# Patient Record
Sex: Female | Born: 1945 | Race: White | Hispanic: No | Marital: Married | Smoking: Former smoker
Health system: Southern US, Community
[De-identification: ages and names within clinical notes are randomized; demographics above are authoritative.]

## PROBLEM LIST (undated history)

## (undated) DIAGNOSIS — N200 Calculus of kidney: Secondary | ICD-10-CM

## (undated) DIAGNOSIS — I10 Essential (primary) hypertension: Secondary | ICD-10-CM

## (undated) DIAGNOSIS — E119 Type 2 diabetes mellitus without complications: Secondary | ICD-10-CM

## (undated) DIAGNOSIS — E7800 Pure hypercholesterolemia, unspecified: Secondary | ICD-10-CM

## (undated) DIAGNOSIS — K5790 Diverticulosis of intestine, part unspecified, without perforation or abscess without bleeding: Secondary | ICD-10-CM

## (undated) HISTORY — PX: COLON SURGERY: SHX602

---

## 1999-07-27 ENCOUNTER — Encounter: Admission: RE | Admit: 1999-07-27 | Discharge: 1999-07-27 | Payer: Self-pay | Admitting: Family Medicine

## 1999-07-27 ENCOUNTER — Encounter: Payer: Self-pay | Admitting: Family Medicine

## 1999-08-04 ENCOUNTER — Other Ambulatory Visit: Admission: RE | Admit: 1999-08-04 | Discharge: 1999-08-04 | Payer: Self-pay | Admitting: Family Medicine

## 1999-08-07 ENCOUNTER — Encounter: Admission: RE | Admit: 1999-08-07 | Discharge: 1999-08-07 | Payer: Self-pay | Admitting: Family Medicine

## 1999-08-07 ENCOUNTER — Encounter: Payer: Self-pay | Admitting: Family Medicine

## 2000-02-08 ENCOUNTER — Encounter: Payer: Self-pay | Admitting: Family Medicine

## 2000-02-08 ENCOUNTER — Encounter: Admission: RE | Admit: 2000-02-08 | Discharge: 2000-02-08 | Payer: Self-pay | Admitting: Family Medicine

## 2000-11-01 ENCOUNTER — Encounter: Admission: RE | Admit: 2000-11-01 | Discharge: 2000-11-01 | Payer: Self-pay | Admitting: Family Medicine

## 2000-11-01 ENCOUNTER — Encounter: Payer: Self-pay | Admitting: Family Medicine

## 2001-01-04 ENCOUNTER — Other Ambulatory Visit: Admission: RE | Admit: 2001-01-04 | Discharge: 2001-01-04 | Payer: Self-pay | Admitting: Emergency Medicine

## 2001-01-16 ENCOUNTER — Encounter: Admission: RE | Admit: 2001-01-16 | Discharge: 2001-01-16 | Payer: Self-pay | Admitting: Family Medicine

## 2001-01-16 ENCOUNTER — Encounter: Payer: Self-pay | Admitting: Family Medicine

## 2002-06-06 ENCOUNTER — Other Ambulatory Visit: Admission: RE | Admit: 2002-06-06 | Discharge: 2002-06-06 | Payer: Self-pay | Admitting: Internal Medicine

## 2003-01-16 ENCOUNTER — Ambulatory Visit (HOSPITAL_COMMUNITY): Admission: RE | Admit: 2003-01-16 | Discharge: 2003-01-16 | Payer: Self-pay | Admitting: Internal Medicine

## 2003-04-02 ENCOUNTER — Encounter: Admission: RE | Admit: 2003-04-02 | Discharge: 2003-07-01 | Payer: Self-pay | Admitting: Internal Medicine

## 2003-09-25 ENCOUNTER — Encounter: Admission: RE | Admit: 2003-09-25 | Discharge: 2003-09-25 | Payer: Self-pay | Admitting: Internal Medicine

## 2003-12-18 ENCOUNTER — Ambulatory Visit (HOSPITAL_COMMUNITY): Admission: RE | Admit: 2003-12-18 | Discharge: 2003-12-18 | Payer: Self-pay | Admitting: Internal Medicine

## 2004-07-01 ENCOUNTER — Ambulatory Visit: Payer: Self-pay | Admitting: Internal Medicine

## 2004-07-08 ENCOUNTER — Ambulatory Visit: Payer: Self-pay | Admitting: Internal Medicine

## 2004-07-19 ENCOUNTER — Emergency Department (HOSPITAL_COMMUNITY): Admission: EM | Admit: 2004-07-19 | Discharge: 2004-07-19 | Payer: Self-pay

## 2004-07-20 ENCOUNTER — Inpatient Hospital Stay (HOSPITAL_COMMUNITY): Admission: EM | Admit: 2004-07-20 | Discharge: 2004-07-22 | Payer: Self-pay | Admitting: Urology

## 2004-08-13 ENCOUNTER — Ambulatory Visit (HOSPITAL_COMMUNITY): Admission: RE | Admit: 2004-08-13 | Discharge: 2004-08-13 | Payer: Self-pay | Admitting: Urology

## 2005-08-12 ENCOUNTER — Ambulatory Visit: Payer: Self-pay | Admitting: Internal Medicine

## 2005-08-19 ENCOUNTER — Ambulatory Visit: Payer: Self-pay | Admitting: Internal Medicine

## 2005-08-19 ENCOUNTER — Other Ambulatory Visit: Admission: RE | Admit: 2005-08-19 | Discharge: 2005-08-19 | Payer: Self-pay | Admitting: Internal Medicine

## 2005-08-19 ENCOUNTER — Encounter: Payer: Self-pay | Admitting: Internal Medicine

## 2006-01-31 ENCOUNTER — Ambulatory Visit: Payer: Self-pay | Admitting: Internal Medicine

## 2006-02-14 ENCOUNTER — Ambulatory Visit: Payer: Self-pay | Admitting: Internal Medicine

## 2006-08-31 ENCOUNTER — Ambulatory Visit: Payer: Self-pay | Admitting: Internal Medicine

## 2006-08-31 LAB — CONVERTED CEMR LAB
ALT: 29 units/L (ref 0–40)
AST: 27 units/L (ref 0–37)
Albumin: 4.1 g/dL (ref 3.5–5.2)
Alkaline Phosphatase: 59 units/L (ref 39–117)
BUN: 13 mg/dL (ref 6–23)
Basophils Absolute: 0.1 10*3/uL (ref 0.0–0.1)
Basophils Relative: 1 % (ref 0.0–1.0)
Bilirubin Urine: NEGATIVE
CO2: 29 meq/L (ref 19–32)
Calcium: 9.7 mg/dL (ref 8.4–10.5)
Chloride: 106 meq/L (ref 96–112)
Chol/HDL Ratio, serum: 3.1
Cholesterol: 157 mg/dL (ref 0–200)
Creatinine, Ser: 0.8 mg/dL (ref 0.4–1.2)
Eosinophil percent: 2.6 % (ref 0.0–5.0)
GFR calc non Af Amer: 77 mL/min
Glomerular Filtration Rate, Af Am: 93 mL/min/{1.73_m2}
Glucose, Bld: 120 mg/dL — ABNORMAL HIGH (ref 70–99)
HCT: 40.3 % (ref 36.0–46.0)
HDL: 50.2 mg/dL (ref >39.0)
Hemoglobin, Urine: NEGATIVE
Hemoglobin: 12.8 g/dL (ref 12.0–15.0)
Ketones, ur: NEGATIVE mg/dL
LDL Cholesterol: 75 mg/dL (ref 0–99)
Leukocytes, UA: NEGATIVE
Lymphocytes Relative: 20.3 % (ref 12.0–46.0)
MCHC: 31.9 g/dL (ref 30.0–36.0)
MCV: 92.9 fL (ref 78.0–100.0)
Monocytes Absolute: 0.7 10*3/uL (ref 0.2–0.7)
Monocytes Relative: 9.4 % (ref 3.0–11.0)
Neutro Abs: 5.1 10*3/uL (ref 1.4–7.7)
Neutrophils Relative %: 66.7 % (ref 43.0–77.0)
Nitrite: NEGATIVE
Platelets: 337 10*3/uL (ref 150–400)
Potassium: 3.9 meq/L (ref 3.5–5.1)
RBC: 4.33 M/uL (ref 3.87–5.11)
RDW: 12.6 % (ref 11.5–14.6)
Sodium: 143 meq/L (ref 135–145)
Specific Gravity, Urine: 1.025 (ref 1.000–1.03)
TSH: 2.03 microintl units/mL (ref 0.35–5.50)
Total Bilirubin: 0.7 mg/dL (ref 0.3–1.2)
Total Protein, Urine: NEGATIVE mg/dL
Total Protein: 7.4 g/dL (ref 6.0–8.3)
Triglyceride fasting, serum: 158 mg/dL — ABNORMAL HIGH (ref 0–149)
Urine Glucose: NEGATIVE mg/dL
Urobilinogen, UA: 0.2 (ref 0.0–1.0)
VLDL: 32 mg/dL (ref 0–40)
WBC: 7.7 10*3/uL (ref 4.5–10.5)
pH: 6 (ref 5.0–8.0)

## 2006-09-02 ENCOUNTER — Ambulatory Visit: Payer: Self-pay | Admitting: Internal Medicine

## 2007-02-21 ENCOUNTER — Ambulatory Visit: Payer: Self-pay | Admitting: Internal Medicine

## 2007-02-21 LAB — CONVERTED CEMR LAB
BUN: 19 mg/dL (ref 6–23)
CO2: 29 meq/L (ref 19–32)
Calcium: 10.3 mg/dL (ref 8.4–10.5)
Chloride: 101 meq/L (ref 96–112)
Cholesterol: 175 mg/dL (ref 0–200)
Creatinine, Ser: 0.8 mg/dL (ref 0.4–1.2)
GFR calc Af Amer: 93 mL/min
GFR calc non Af Amer: 77 mL/min
Glucose, Bld: 133 mg/dL — ABNORMAL HIGH (ref 70–99)
HDL: 53 mg/dL (ref >39.0)
Hgb A1c MFr Bld: 6.5 % — ABNORMAL HIGH (ref 4.6–6.0)
LDL Cholesterol: 88 mg/dL (ref 0–99)
Potassium: 4.5 meq/L (ref 3.5–5.1)
Sodium: 143 meq/L (ref 135–145)
Total CHOL/HDL Ratio: 3.3
Triglycerides: 171 mg/dL — ABNORMAL HIGH (ref 0–149)
Uric Acid, Serum: 8 mg/dL — ABNORMAL HIGH (ref 2.4–7.0)
VLDL: 34 mg/dL (ref 0–40)

## 2007-02-24 ENCOUNTER — Ambulatory Visit: Payer: Self-pay | Admitting: Internal Medicine

## 2007-11-10 ENCOUNTER — Encounter: Payer: Self-pay | Admitting: Internal Medicine

## 2008-04-16 ENCOUNTER — Telehealth: Payer: Self-pay | Admitting: Internal Medicine

## 2008-04-29 ENCOUNTER — Telehealth: Payer: Self-pay | Admitting: Internal Medicine

## 2008-06-10 ENCOUNTER — Ambulatory Visit: Payer: Self-pay | Admitting: Internal Medicine

## 2008-06-11 LAB — CONVERTED CEMR LAB
ALT: 38 units/L — ABNORMAL HIGH (ref 0–35)
AST: 37 units/L (ref 0–37)
Albumin: 4 g/dL (ref 3.5–5.2)
Alkaline Phosphatase: 52 units/L (ref 39–117)
BUN: 12 mg/dL (ref 6–23)
Basophils Absolute: 0 10*3/uL (ref 0.0–0.1)
Basophils Relative: 0.1 % (ref 0.0–3.0)
Bilirubin Urine: NEGATIVE
Bilirubin, Direct: 0.2 mg/dL (ref 0.0–0.3)
CO2: 29 meq/L (ref 19–32)
Calcium: 9.3 mg/dL (ref 8.4–10.5)
Chloride: 102 meq/L (ref 96–112)
Cholesterol: 166 mg/dL (ref 0–200)
Creatinine, Ser: 0.8 mg/dL (ref 0.4–1.2)
Direct LDL: 77.1 mg/dL
Eosinophils Absolute: 0.2 10*3/uL (ref 0.0–0.7)
Eosinophils Relative: 3.3 % (ref 0.0–5.0)
GFR calc Af Amer: 93 mL/min
GFR calc non Af Amer: 77 mL/min
Glucose, Bld: 126 mg/dL — ABNORMAL HIGH (ref 70–99)
HCT: 39.1 % (ref 36.0–46.0)
HDL: 53.6 mg/dL (ref >39.0)
Hemoglobin, Urine: NEGATIVE
Hemoglobin: 13.4 g/dL (ref 12.0–15.0)
Hgb A1c MFr Bld: 6.7 % — ABNORMAL HIGH (ref 4.6–6.0)
Ketones, ur: NEGATIVE mg/dL
Leukocytes, UA: NEGATIVE
Lymphocytes Relative: 17.7 % (ref 12.0–46.0)
MCHC: 34.4 g/dL (ref 30.0–36.0)
MCV: 92.8 fL (ref 78.0–100.0)
Monocytes Absolute: 0.6 10*3/uL (ref 0.1–1.0)
Monocytes Relative: 10.6 % (ref 3.0–12.0)
Neutro Abs: 4.2 10*3/uL (ref 1.4–7.7)
Neutrophils Relative %: 68.3 % (ref 43.0–77.0)
Nitrite: NEGATIVE
Platelets: 274 10*3/uL (ref 150–400)
Potassium: 4.2 meq/L (ref 3.5–5.1)
RBC: 4.21 M/uL (ref 3.87–5.11)
RDW: 12.8 % (ref 11.5–14.6)
Sodium: 140 meq/L (ref 135–145)
Specific Gravity, Urine: <=1.005 (ref 1.000–1.03)
TSH: 1.34 microintl units/mL (ref 0.35–5.50)
Total Bilirubin: 1 mg/dL (ref 0.3–1.2)
Total CHOL/HDL Ratio: 3.1
Total Protein, Urine: NEGATIVE mg/dL
Total Protein: 6.9 g/dL (ref 6.0–8.3)
Triglycerides: 227 mg/dL (ref 0–149)
Urine Glucose: NEGATIVE mg/dL
Urobilinogen, UA: 0.2 (ref 0.0–1.0)
VLDL: 45 mg/dL — ABNORMAL HIGH (ref 0–40)
WBC: 6.1 10*3/uL (ref 4.5–10.5)
pH: 7 (ref 5.0–8.0)

## 2008-06-13 DIAGNOSIS — M109 Gout, unspecified: Secondary | ICD-10-CM

## 2008-06-13 DIAGNOSIS — E785 Hyperlipidemia, unspecified: Secondary | ICD-10-CM | POA: Insufficient documentation

## 2008-06-13 DIAGNOSIS — E119 Type 2 diabetes mellitus without complications: Secondary | ICD-10-CM

## 2008-06-13 DIAGNOSIS — F329 Major depressive disorder, single episode, unspecified: Secondary | ICD-10-CM

## 2008-06-13 DIAGNOSIS — I10 Essential (primary) hypertension: Secondary | ICD-10-CM | POA: Insufficient documentation

## 2008-06-13 DIAGNOSIS — K219 Gastro-esophageal reflux disease without esophagitis: Secondary | ICD-10-CM

## 2008-06-13 DIAGNOSIS — Z8719 Personal history of other diseases of the digestive system: Secondary | ICD-10-CM

## 2008-06-13 DIAGNOSIS — F411 Generalized anxiety disorder: Secondary | ICD-10-CM

## 2008-06-13 DIAGNOSIS — M810 Age-related osteoporosis without current pathological fracture: Secondary | ICD-10-CM | POA: Insufficient documentation

## 2008-06-13 DIAGNOSIS — Z87442 Personal history of urinary calculi: Secondary | ICD-10-CM

## 2008-06-13 DIAGNOSIS — K279 Peptic ulcer, site unspecified, unspecified as acute or chronic, without hemorrhage or perforation: Secondary | ICD-10-CM | POA: Insufficient documentation

## 2008-06-14 ENCOUNTER — Ambulatory Visit: Payer: Self-pay | Admitting: Internal Medicine

## 2008-06-18 ENCOUNTER — Telehealth: Payer: Self-pay | Admitting: Internal Medicine

## 2008-08-08 ENCOUNTER — Encounter: Payer: Self-pay | Admitting: Internal Medicine

## 2008-11-19 ENCOUNTER — Telehealth (INDEPENDENT_AMBULATORY_CARE_PROVIDER_SITE_OTHER): Payer: Self-pay | Admitting: *Deleted

## 2008-12-05 ENCOUNTER — Ambulatory Visit: Payer: Self-pay | Admitting: Internal Medicine

## 2008-12-05 LAB — CONVERTED CEMR LAB
BUN: 17 mg/dL (ref 6–23)
CO2: 29 meq/L (ref 19–32)
Calcium: 9.6 mg/dL (ref 8.4–10.5)
Chloride: 103 meq/L (ref 96–112)
Cholesterol: 158 mg/dL (ref 0–200)
Creatinine, Ser: 0.7 mg/dL (ref 0.4–1.2)
GFR calc non Af Amer: 89.34 mL/min (ref >60)
Glucose, Bld: 140 mg/dL — ABNORMAL HIGH (ref 70–99)
HDL: 50.4 mg/dL (ref >39.00)
Hgb A1c MFr Bld: 6.7 % — ABNORMAL HIGH (ref 4.6–6.5)
LDL Cholesterol: 69 mg/dL (ref 0–99)
Potassium: 4.3 meq/L (ref 3.5–5.1)
Sodium: 140 meq/L (ref 135–145)
Total CHOL/HDL Ratio: 3
Triglycerides: 192 mg/dL — ABNORMAL HIGH (ref 0.0–149.0)
VLDL: 38.4 mg/dL (ref 0.0–40.0)

## 2008-12-10 ENCOUNTER — Ambulatory Visit: Payer: Self-pay | Admitting: Internal Medicine

## 2008-12-10 DIAGNOSIS — R5381 Other malaise: Secondary | ICD-10-CM

## 2008-12-10 DIAGNOSIS — R5383 Other fatigue: Secondary | ICD-10-CM

## 2008-12-11 ENCOUNTER — Telehealth (INDEPENDENT_AMBULATORY_CARE_PROVIDER_SITE_OTHER): Payer: Self-pay | Admitting: *Deleted

## 2008-12-17 ENCOUNTER — Encounter: Payer: Self-pay | Admitting: Internal Medicine

## 2008-12-17 ENCOUNTER — Other Ambulatory Visit: Admission: RE | Admit: 2008-12-17 | Discharge: 2008-12-17 | Payer: Self-pay | Admitting: Obstetrics & Gynecology

## 2009-01-22 ENCOUNTER — Ambulatory Visit (HOSPITAL_COMMUNITY): Admission: RE | Admit: 2009-01-22 | Discharge: 2009-01-22 | Payer: Self-pay | Admitting: Obstetrics & Gynecology

## 2009-02-21 ENCOUNTER — Encounter: Payer: Self-pay | Admitting: Obstetrics & Gynecology

## 2009-02-21 ENCOUNTER — Ambulatory Visit (HOSPITAL_COMMUNITY): Admission: RE | Admit: 2009-02-21 | Discharge: 2009-02-21 | Payer: Self-pay | Admitting: Obstetrics & Gynecology

## 2009-04-28 ENCOUNTER — Encounter: Payer: Self-pay | Admitting: Internal Medicine

## 2010-12-07 LAB — CBC
HCT: 41 % (ref 36.0–46.0)
Hemoglobin: 14.1 g/dL (ref 12.0–15.0)
MCHC: 34.5 g/dL (ref 30.0–36.0)
MCV: 94.2 fL (ref 78.0–100.0)
Platelets: 258 10*3/uL (ref 150–400)
RBC: 4.35 MIL/uL (ref 3.87–5.11)
RDW: 13.3 % (ref 11.5–15.5)
WBC: 6.4 10*3/uL (ref 4.0–10.5)

## 2010-12-07 LAB — URINALYSIS, ROUTINE W REFLEX MICROSCOPIC
Bilirubin Urine: NEGATIVE
Glucose, UA: NEGATIVE mg/dL
Hgb urine dipstick: NEGATIVE
Ketones, ur: NEGATIVE mg/dL
Nitrite: NEGATIVE
Protein, ur: NEGATIVE mg/dL
Specific Gravity, Urine: 1.015 (ref 1.005–1.030)
Urobilinogen, UA: 0.2 mg/dL (ref 0.0–1.0)
pH: 6.5 (ref 5.0–8.0)

## 2010-12-07 LAB — BASIC METABOLIC PANEL
BUN: 19 mg/dL (ref 6–23)
CO2: 28 mEq/L (ref 19–32)
Calcium: 10.4 mg/dL (ref 8.4–10.5)
Chloride: 101 mEq/L (ref 96–112)
Creatinine, Ser: 0.7 mg/dL (ref 0.4–1.2)
GFR calc Af Amer: >60 mL/min (ref >60)
GFR calc non Af Amer: >60 mL/min (ref >60)
Glucose, Bld: 116 mg/dL — ABNORMAL HIGH (ref 70–99)
Potassium: 3.9 mEq/L (ref 3.5–5.1)
Sodium: 139 mEq/L (ref 135–145)

## 2011-01-12 NOTE — Op Note (Signed)
NAMEVIRDA, BETTERS               ACCOUNT NO.:  [id number]   MEDICAL RECORD NO.:  [id number]          PATIENT TYPE:  AMB   LOCATION:  SDC                           FACILITY:  [facility]   PHYSICIAN:  M. Leda Quail, MD  DATE OF BIRTH:  [birth year]   DATE OF PROCEDURE:  02/21/2009  DATE OF DISCHARGE:                               OPERATIVE REPORT   PREOPERATIVE DIAGNOSES:  25. A 65 year old G2, P2, married white female with postmenopausal      bleeding.  2. Endometrial polyps noted on sonohysterogram.  3. Hypertension.  4. Diabetes mellitus type 2.  5. History of cesarean section x1.   POSTOPERATIVE DIAGNOSES:  43. A 65 year old G2, P2, married white female with postmenopausal      bleeding.  2. Endometrial polyps noted on sonohysterogram.  3. Hypertension.  4. Diabetes mellitus type 2.  5. History of cesarean section x1.   PROCEDURE:  Hysteroscopy, dilation and curettage with polyp resection.   SURGEON:  M. Leda Quail, MD   ASSISTANT:  OR staff.   ANESTHESIA:  LMA, Dr. Malen Gauze saw the case.   FINDINGS:  Anterior endometrial polyp.   SPECIMENS:  Polyp and curettings.   DISPOSITION:  Specimen to Pathology.   ESTIMATED BLOOD LOSS:  Minimal.   FLUIDS:  1000 mL of LR.   URINE OUTPUT:  75 mL.   FLUID DEFICIT:  10 mL of 3% sorbitol.   COMPLICATIONS:  None.   INDICATIONS:  Melissa [name] is a 65 year old G2, P2, married white female  with history of postmenopausal bleeding.  She came to me as a new  patient with a history of bleeding just over the last few weeks.  She  did menopause for about 15 years and was on HRT years ago, but has not  been on this for least 10 years or greater.  Pap smear was normal.  Sonohysterogram was ordered which showed actually 2 polyps, 1 larger and  1 smaller.  Because of this finding, an endometrial biopsy was performed  as well, this was negative except that the biopsy showed superficial  endometrium with benign cervix mucosa and limited  specimen.  I  recommended removal.  The risks and benefits were explained to the  patient.  She was desirous of proceeding and presents for this today.   DESCRIPTION OF PROCEDURE:  The patient was taken to operating room.  She  was placed in supine position.  Anesthesia was administered by the  anesthesia staff without difficulty.  Legs positioned in the low  lithotomy position in [place] stirrups.  SCDs on lower extremities  bilaterally.  Perineum, inner thighs, and vagina were prepped in normal  sterile fashion.  Legs were lifted to the high lithotomy position after  she was draped in a normal sterile fashion.  A red rubber Foley catheter  was used to drain the bladder of all urine.  Bivalve speculum was placed  in the vagina.  The anterior lip of the cervix was grasped with a single  tenaculum.  Paracervical block with 1% lidocaine mixed with 1:1  epinephrine (1:100,000 units) was instilled circumferentially  around the  cervix.  10 mL total was used.  Then, using Pratt dilators, the  endocervix was dilated.  The diagnostic scope was then obtained.  Then,  using a hydrodilation, the scope was passed easily into the endometrial  canal.  This was visualized easily as this was performed.  There was an  anterior polyp and that was all was present.  The endometrial cavity was  nice and atrophic.  Tubal ostia were noted bilaterally and photo  documentation was made.  Then, the scope was removed and a #1 rough  curette was obtained.  The polyp was grasped with a rough curette and  then entire endometrial cavity was curetted until a gritty texture was  noted.  The scope was easily visualized the cavity, the polyp was  completely removed.  At this point, the procedure was ended.  All  instruments removed from vagina.  The tenaculum was removed from the  anterior lip of cervix.  No bleeding noted.  Legs positioned back in the  supine position after the Betadine prep was cleansed off her skin.    Sponge, lap, needle, and instruments counts were correct x2.  The  patient tolerated the procedure well.  She was awaken from anesthesia  and taken to recovery in stable condition.      Lum Keas, MD  Electronically Signed     MSM/MEDQ  D:  02/21/2009  T:  02/21/2009  Job:  [id number]

## 2011-01-15 NOTE — Op Note (Signed)
[name], [name]               ACCOUNT NO.:  [id number]   MEDICAL RECORD NO.:  [id number]          PATIENT TYPE:  INP   LOCATION:  0465                         FACILITY:  [facility]   PHYSICIAN:  Claudette Laws, M.D.  DATE OF BIRTH:  03-08-1946   DATE OF PROCEDURE:  07/20/2004  DATE OF DISCHARGE:                                 OPERATIVE REPORT   PREOPERATIVE DIAGNOSIS:  Obstructing proximal right ureteral calculus with  urinary tract infection.   POSTOPERATIVE DIAGNOSIS:  Obstructing proximal right ureteral calculus with  urinary tract infection.   OPERATION:  1.  Cystoscopy.  2.  Insertion of a 6 French 26 cm double-J stent.   SURGEON:  Claudette Laws, M.D.   HISTORY:  This is a 65 year old white female, who presented to the [facility] Emergency Room early Sunday morning with right flank pain.  CT scan  showed a proximal right ureteral stone with hydronephrosis.  At that time,  her white count was 13,000.  The patient was put on Cipro, and then the  husband called me later on in the day about her condition.  She was seen by  me early this morning where although she was feeling better, she was  flushed, and I thought we ought to go ahead and put up a double-J stent and  drain the kidney as it appeared she was getting into some trouble with a  UTI, possible sepsis.  The procedure was explained to both of them.  They  understand, and she was lined up for later on in the afternoon.   DESCRIPTION OF PROCEDURE:  The patient was prepped and draped in the dorsal  lithotomy position under intubated general anesthesia.  Cystoscopy was  performed with a 22 French rigid cystoscope.  A bladder urine was obtained.  It was cloudy, submitted to the lab for culture.  The bladder itself was  grossly normal, and no obvious tumors, no calculi, normal ureteral orifice.   Initially I passed up a 0.038 Glidewire through a 6 [place] open-ended  ureteral catheter.  Using fluoroscopic control, the  guidewire was passed up  to the kidney, and a curl was obtained.  I was sure that I got by the stone;  therefore, no contrast was injected to avoid showering her system with  bacteria.  I the passed up a 6 French 26 cm double-J stent, again using  fluoroscopic control.  The proximal end was curled up in the kidney.  Distal  end was curled up in the bladder.  Appropriate picture was taken; the  bladder was emptied.  A B&O suppository was placed for hemostatic purposes,  and she was taken back to the recovery room in satisfactory condition.     Rona   RFS/MEDQ  D:  07/20/2004  T:  07/20/2004  Job:  [id number]

## 2011-01-15 NOTE — Discharge Summary (Signed)
Melissa [name], Melissa [name]               ACCOUNT NO.:  [id number]   MEDICAL RECORD NO.:  [id number]          PATIENT TYPE:  INP   LOCATION:  0465                         FACILITY:  [facility]   PHYSICIAN:  Claudette Laws, M.D.  DATE OF BIRTH:  May 23, 1946   DATE OF ADMISSION:  07/20/2004  DATE OF DISCHARGE:  07/22/2004                                 DISCHARGE SUMMARY   HISTORY:  This is a 65 year old lady who presented to the [facility]  emergency room early in the morning on July 19, 2004 with flank pain.  Subsequent CAT scan revealed about a 6-mm obstructing stone in the proximal  right ureter. The patient was sent home and then referred to me for  followup. I saw her the next morning in the office with a temperature, not  feeling well. It was noted that her white count was 13,000. At that point, I  recommended that we go ahead and unblock the kidney with a double J stent.  The procedure was explained to she and her husband. Later on that day, she  was taken to the operating room where a double J stent was put up. Also, the  stone was pushed back into the lower pole of the right kidney.   The patient when she came in for the surgery was noted to be slightly  azotemic with a BUN of 34, creatinine of 2.5.   After putting the double J stent on and culturing the urine, I put her on  Zosyn and Cipro for antibiotic coverage pending the culture results. By the  next day, her white cell count was 13,000, hemoglobin 10.6, hematocrit 31.6.  Her BUN had dropped to 28, creatinine dropped down to 1.7. Over the next 48  hours, she did well. Her temperature gradually defervesced. She was feeling  better, voiding satisfactorily. At the time of discharge, it was noted that  her pulmonary culture was Escherichia coli and proteus. On the day of  discharge, her white cell count was down to 11,400, hemoglobin 11, BUN 16,  creatinine 1.1. The patient was sent home then pending the culture. I put  her on  Cipro.   The patient also was hypertensive under the care of Dr. Birdie Sons, and  her blood pressure was a little on the low side, ranging systolic 88 to 104  over diastolic of 41 to 52. She will hold off on her blood pressure medicine  for the weekend.   IMPRESSION:  1.  Obstructing right ureteral stone with subsequent urosepsis.  2.  History of hypertension.  3.  Cold sores/herpes.   OPERATION:  Cystoscopy and insertion of a double J stent on July 20, 2004.   CONDITION ON DISCHARGE:  Stable.   DISCHARGE MEDICATIONS:  1.  Cipro XR 500 mg 1 a day, #7.  2.  Famvir 125 mg 1 b.i.d. for 5 days for cold sores.  3.  She will hold off on her blood pressure medicine for the next few days.   DISPOSITION:  Regular diet. Force fluids. Activity as tolerated. To see me  in the  office in the next 5 to 6 days for follow up.   PLAN:  I was to perform lithotripsy upon the stone once she stabilizes.     Rona   RFS/MEDQ  D:  07/22/2004  T:  07/22/2004  Job:  [id number]   cc:   Valetta Mole. Swords, M.D. [facility]

## 2011-01-15 NOTE — Op Note (Signed)
NAME:  Melissa [name], Melissa [name]                         ACCOUNT NO.:  [id number]   MEDICAL RECORD NO.:  [id number]                   PATIENT TYPE:  AMB   LOCATION:  ENDO                                 [facility]ACILITY:  [facility]   PHYSICIAN:  Lina Sar, M.D. [facility]               DATE OF BIRTH:  1946-01-06   DATE OF PROCEDURE:  01/16/2003  DATE OF DISCHARGE:                                 OPERATIVE REPORT   PROCEDURE:  Colonoscopy   GASTROENTEROLOGIST:  Hedwig Morton. Juanda Chance, M.D.   INDICATIONS FOR PROCEDURE:  This 65 year old black female was referred by  Dr. Cato Mulligan for colonoscopy.  She has a history of symptomatic diverticulosis  and underwent sigmoid colon resection in 1995 after colonic perforation.  She had a diverting colostomy and takedown of colostomy in 1995.  She has  done quite well but has developed right upper quadrant abdominal pain in the  last several week consistent with symptomatic diverticulosis.  She is now  undergoing colonoscopy because of the suspected diverticular disease, also  to rule out colonic stricture of the anastomosis.  She has diarrhea and  constipation.  On physical exam on 01/09/2003, abdomen was nondistended, and  bowel sounds were active.  Rectal exam was not done.   ENDOSCOPE:  Olympus single-channel video endoscope.   SEDATION:  Versed 5 mg, IV Demerol 70 mg IV.   FINDINGS:  Olympus single-channel endoscope was passed under direct vision  through the rectum to the sigmoid colon.  The patient was monitored by pulse  oximetry.  Oxygen saturation was normal.  His prep was excellent.  Anal  column and rectal ampulla were unremarkable.  Sigmoid anastomosis was  located at 15 cm from the anal verge, and it was wide open.  There was a  pouch at the anastomosis measuring about 2 or 3 cm in length.  It was end-to-  side anastomosis with the sigmoid colon.  The main lumen of the sigmoid  colon appeared normal with marked diverticula in the distribution of the  ascending  colon, splenic flexure, transverse colon, hepatic bed ascending  colon all the way to the cecum.  There was no significant stricture or  mucosal inflammatory change to suggest diverticulitis.  Cecum  transilluminated at right lower quadrant was generally quite short measuring  only about 70 cm due to previous sigmoid resection.  Colonoscope was then  retracted and colon decompressed.  The patient tolerated the procedure well.   IMPRESSION:  1. Universal diverticulosis of the left and right colon.  2. Status post remote sigmoid resection, colostomy, and takedown of     colostomy with wide open anastomosis.   PLAN:  Treat for symptomatic diverticulosis using antispasmodics and daily  fiber supplements of Metamucil 1 tablespoon daily.  Repeat colonoscopy in 10  years.  Lina Sar, M.D. [facility]   DB/MEDQ  D:  01/16/2003  T:  01/16/2003  Job:  [id number]   cc:   Valetta Mole. Swords, M.D. [facility]

## 2011-12-06 ENCOUNTER — Other Ambulatory Visit: Payer: Self-pay | Admitting: Obstetrics and Gynecology

## 2011-12-06 ENCOUNTER — Other Ambulatory Visit (HOSPITAL_COMMUNITY)
Admission: RE | Admit: 2011-12-06 | Discharge: 2011-12-06 | Disposition: A | Discharge status: Home / Self Care | Payer: Medicare Other | Source: Ambulatory Visit | Attending: Obstetrics and Gynecology | Admitting: Obstetrics and Gynecology

## 2011-12-06 DIAGNOSIS — Z124 Encounter for screening for malignant neoplasm of cervix: Secondary | ICD-10-CM | POA: Insufficient documentation

## 2011-12-20 ENCOUNTER — Other Ambulatory Visit (HOSPITAL_COMMUNITY): Payer: Self-pay | Admitting: Obstetrics and Gynecology

## 2011-12-20 DIAGNOSIS — Z1231 Encounter for screening mammogram for malignant neoplasm of breast: Secondary | ICD-10-CM

## 2012-01-03 ENCOUNTER — Ambulatory Visit (HOSPITAL_COMMUNITY)
Admission: RE | Admit: 2012-01-03 | Discharge: 2012-01-03 | Disposition: A | Discharge status: Home / Self Care | Payer: Medicare Other | Source: Ambulatory Visit | Attending: Obstetrics and Gynecology | Admitting: Obstetrics and Gynecology

## 2012-01-03 DIAGNOSIS — Z1231 Encounter for screening mammogram for malignant neoplasm of breast: Secondary | ICD-10-CM | POA: Insufficient documentation

## 2012-01-05 ENCOUNTER — Other Ambulatory Visit: Payer: Self-pay | Admitting: Obstetrics and Gynecology

## 2012-01-05 DIAGNOSIS — R928 Other abnormal and inconclusive findings on diagnostic imaging of breast: Secondary | ICD-10-CM

## 2012-01-06 ENCOUNTER — Ambulatory Visit
Admission: RE | Admit: 2012-01-06 | Discharge: 2012-01-06 | Disposition: A | Discharge status: Home / Self Care | Payer: Medicare Other | Source: Ambulatory Visit | Attending: Obstetrics and Gynecology | Admitting: Obstetrics and Gynecology

## 2012-01-06 ENCOUNTER — Other Ambulatory Visit: Payer: Self-pay | Admitting: Obstetrics and Gynecology

## 2012-01-06 DIAGNOSIS — R928 Other abnormal and inconclusive findings on diagnostic imaging of breast: Secondary | ICD-10-CM

## 2012-01-06 DIAGNOSIS — N6310 Unspecified lump in the right breast, unspecified quadrant: Secondary | ICD-10-CM

## 2012-01-07 ENCOUNTER — Ambulatory Visit
Admission: RE | Admit: 2012-01-07 | Discharge: 2012-01-07 | Disposition: A | Discharge status: Home / Self Care | Payer: Medicare Other | Source: Ambulatory Visit | Attending: Obstetrics and Gynecology | Admitting: Obstetrics and Gynecology

## 2012-01-07 ENCOUNTER — Other Ambulatory Visit: Payer: Self-pay | Admitting: Diagnostic Radiology

## 2012-01-07 DIAGNOSIS — R928 Other abnormal and inconclusive findings on diagnostic imaging of breast: Secondary | ICD-10-CM

## 2012-01-07 DIAGNOSIS — N6310 Unspecified lump in the right breast, unspecified quadrant: Secondary | ICD-10-CM

## 2013-05-16 ENCOUNTER — Other Ambulatory Visit: Payer: Self-pay | Admitting: Internal Medicine

## 2013-05-16 DIAGNOSIS — R748 Abnormal levels of other serum enzymes: Secondary | ICD-10-CM

## 2013-05-16 DIAGNOSIS — R7989 Other specified abnormal findings of blood chemistry: Secondary | ICD-10-CM

## 2013-05-18 ENCOUNTER — Ambulatory Visit
Admission: RE | Admit: 2013-05-18 | Discharge: 2013-05-18 | Disposition: A | Discharge status: Home / Self Care | Payer: Medicare Other | Source: Ambulatory Visit | Attending: Internal Medicine | Admitting: Internal Medicine

## 2013-05-18 DIAGNOSIS — R748 Abnormal levels of other serum enzymes: Secondary | ICD-10-CM

## 2013-06-13 ENCOUNTER — Other Ambulatory Visit: Payer: Self-pay | Admitting: Internal Medicine

## 2013-06-13 ENCOUNTER — Ambulatory Visit
Admission: RE | Admit: 2013-06-13 | Discharge: 2013-06-13 | Disposition: A | Discharge status: Home / Self Care | Payer: Medicare Other | Source: Ambulatory Visit | Attending: Internal Medicine | Admitting: Internal Medicine

## 2013-06-13 DIAGNOSIS — M79609 Pain in unspecified limb: Secondary | ICD-10-CM

## 2013-06-14 ENCOUNTER — Other Ambulatory Visit (HOSPITAL_COMMUNITY): Payer: Self-pay | Admitting: Internal Medicine

## 2013-06-14 ENCOUNTER — Ambulatory Visit (HOSPITAL_COMMUNITY)
Admission: RE | Admit: 2013-06-14 | Discharge: 2013-06-14 | Disposition: A | Discharge status: Home / Self Care | Payer: Medicare Other | Source: Ambulatory Visit | Attending: Vascular Surgery | Admitting: Vascular Surgery

## 2013-06-14 DIAGNOSIS — R0989 Other specified symptoms and signs involving the circulatory and respiratory systems: Secondary | ICD-10-CM

## 2013-07-12 ENCOUNTER — Ambulatory Visit: Payer: Medicare Other

## 2013-07-19 ENCOUNTER — Ambulatory Visit: Payer: Medicare Other

## 2013-07-25 ENCOUNTER — Ambulatory Visit: Payer: Medicare Other

## 2015-11-09 ENCOUNTER — Encounter (HOSPITAL_COMMUNITY): Payer: Self-pay | Admitting: *Deleted

## 2015-11-09 DIAGNOSIS — E669 Obesity, unspecified: Secondary | ICD-10-CM | POA: Diagnosis present

## 2015-11-09 DIAGNOSIS — I10 Essential (primary) hypertension: Secondary | ICD-10-CM | POA: Diagnosis present

## 2015-11-09 DIAGNOSIS — K219 Gastro-esophageal reflux disease without esophagitis: Secondary | ICD-10-CM | POA: Diagnosis present

## 2015-11-09 DIAGNOSIS — F419 Anxiety disorder, unspecified: Secondary | ICD-10-CM | POA: Diagnosis present

## 2015-11-09 DIAGNOSIS — Z87891 Personal history of nicotine dependence: Secondary | ICD-10-CM | POA: Diagnosis not present

## 2015-11-09 DIAGNOSIS — Z6833 Body mass index (BMI) 33.0-33.9, adult: Secondary | ICD-10-CM | POA: Diagnosis not present

## 2015-11-09 DIAGNOSIS — K5791 Diverticulosis of intestine, part unspecified, without perforation or abscess with bleeding: Principal | ICD-10-CM | POA: Diagnosis present

## 2015-11-09 DIAGNOSIS — Z87442 Personal history of urinary calculi: Secondary | ICD-10-CM | POA: Diagnosis not present

## 2015-11-09 DIAGNOSIS — E119 Type 2 diabetes mellitus without complications: Secondary | ICD-10-CM | POA: Diagnosis present

## 2015-11-09 NOTE — ED Notes (Signed)
Pt c/o right sided flank pain x 1 week. Reports hx diverticulosis and a "bend in my colon on the right side." states that last night she had a bright red small stool. States she had BM early today with no visible blood. Pt states she only ate 1 egg this morning then only drank liquids throughout the day. States that at 2230 tonight she ate half a cup of cream of potatoe soup and has had 2 soft bright red stools.

## 2015-11-10 ENCOUNTER — Emergency Department (HOSPITAL_COMMUNITY): Payer: Medicare Other

## 2015-11-10 ENCOUNTER — Encounter (HOSPITAL_COMMUNITY): Payer: Self-pay | Admitting: Internal Medicine

## 2015-11-10 ENCOUNTER — Inpatient Hospital Stay (HOSPITAL_COMMUNITY)
Admission: EM | Admit: 2015-11-10 | Discharge: 2015-11-11 | DRG: 379 | Disposition: A | Discharge status: Home / Self Care | Payer: Medicare Other | Attending: Internal Medicine | Admitting: Internal Medicine

## 2015-11-10 DIAGNOSIS — E669 Obesity, unspecified: Secondary | ICD-10-CM | POA: Diagnosis present

## 2015-11-10 DIAGNOSIS — E118 Type 2 diabetes mellitus with unspecified complications: Secondary | ICD-10-CM | POA: Insufficient documentation

## 2015-11-10 DIAGNOSIS — K219 Gastro-esophageal reflux disease without esophagitis: Secondary | ICD-10-CM | POA: Diagnosis present

## 2015-11-10 DIAGNOSIS — F419 Anxiety disorder, unspecified: Secondary | ICD-10-CM | POA: Diagnosis present

## 2015-11-10 DIAGNOSIS — K922 Gastrointestinal hemorrhage, unspecified: Secondary | ICD-10-CM | POA: Diagnosis present

## 2015-11-10 DIAGNOSIS — Z87891 Personal history of nicotine dependence: Secondary | ICD-10-CM | POA: Diagnosis not present

## 2015-11-10 DIAGNOSIS — I10 Essential (primary) hypertension: Secondary | ICD-10-CM | POA: Diagnosis present

## 2015-11-10 DIAGNOSIS — F411 Generalized anxiety disorder: Secondary | ICD-10-CM | POA: Diagnosis present

## 2015-11-10 DIAGNOSIS — E119 Type 2 diabetes mellitus without complications: Secondary | ICD-10-CM | POA: Diagnosis present

## 2015-11-10 DIAGNOSIS — K5791 Diverticulosis of intestine, part unspecified, without perforation or abscess with bleeding: Secondary | ICD-10-CM | POA: Diagnosis present

## 2015-11-10 DIAGNOSIS — R109 Unspecified abdominal pain: Secondary | ICD-10-CM | POA: Diagnosis present

## 2015-11-10 DIAGNOSIS — Z6833 Body mass index (BMI) 33.0-33.9, adult: Secondary | ICD-10-CM | POA: Diagnosis not present

## 2015-11-10 DIAGNOSIS — Z87442 Personal history of urinary calculi: Secondary | ICD-10-CM | POA: Diagnosis not present

## 2015-11-10 DIAGNOSIS — K625 Hemorrhage of anus and rectum: Secondary | ICD-10-CM | POA: Diagnosis present

## 2015-11-10 HISTORY — DX: Diverticulosis of intestine, part unspecified, without perforation or abscess without bleeding: K57.90

## 2015-11-10 HISTORY — DX: Essential (primary) hypertension: I10

## 2015-11-10 HISTORY — DX: Calculus of kidney: N20.0

## 2015-11-10 HISTORY — DX: Type 2 diabetes mellitus without complications: E11.9

## 2015-11-10 HISTORY — DX: Pure hypercholesterolemia, unspecified: E78.00

## 2015-11-10 LAB — COMPREHENSIVE METABOLIC PANEL
ALBUMIN: 4.2 g/dL (ref 3.5–5.0)
ALK PHOS: 54 U/L (ref 38–126)
ALT: 44 U/L (ref 14–54)
AST: 40 U/L (ref 15–41)
Anion gap: 13 (ref 5–15)
BUN: 15 mg/dL (ref 6–20)
CALCIUM: 10 mg/dL (ref 8.9–10.3)
CHLORIDE: 104 mmol/L (ref 101–111)
CO2: 23 mmol/L (ref 22–32)
CREATININE: 0.77 mg/dL (ref 0.44–1.00)
GFR calc non Af Amer: >60 mL/min (ref >60)
GLUCOSE: 184 mg/dL — AB (ref 65–99)
Potassium: 3.8 mmol/L (ref 3.5–5.1)
SODIUM: 140 mmol/L (ref 135–145)
Total Bilirubin: 0.7 mg/dL (ref 0.3–1.2)
Total Protein: 7.2 g/dL (ref 6.5–8.1)

## 2015-11-10 LAB — URINALYSIS, ROUTINE W REFLEX MICROSCOPIC
Bilirubin Urine: NEGATIVE
GLUCOSE, UA: NEGATIVE mg/dL
HGB URINE DIPSTICK: NEGATIVE
Ketones, ur: NEGATIVE mg/dL
LEUKOCYTES UA: NEGATIVE
NITRITE: NEGATIVE
PH: 5.5 (ref 5.0–8.0)
PROTEIN: NEGATIVE mg/dL
Specific Gravity, Urine: 1.019 (ref 1.005–1.030)

## 2015-11-10 LAB — GLUCOSE, CAPILLARY
GLUCOSE-CAPILLARY: 150 mg/dL — AB (ref 65–99)
GLUCOSE-CAPILLARY: 119 mg/dL — AB (ref 65–99)
Glucose-Capillary: 148 mg/dL — ABNORMAL HIGH (ref 65–99)

## 2015-11-10 LAB — CBC
HCT: 37.5 % (ref 36.0–46.0)
HCT: 39.2 % (ref 36.0–46.0)
Hemoglobin: 11.9 g/dL — ABNORMAL LOW (ref 12.0–15.0)
Hemoglobin: 12.5 g/dL (ref 12.0–15.0)
MCH: 29.8 pg (ref 26.0–34.0)
MCH: 29.9 pg (ref 26.0–34.0)
MCHC: 31.7 g/dL (ref 30.0–36.0)
MCHC: 31.9 g/dL (ref 30.0–36.0)
MCV: 93.8 fL (ref 78.0–100.0)
MCV: 94 fL (ref 78.0–100.0)
PLATELETS: 235 10*3/uL (ref 150–400)
PLATELETS: 264 10*3/uL (ref 150–400)
RBC: 3.99 MIL/uL (ref 3.87–5.11)
RBC: 4.18 MIL/uL (ref 3.87–5.11)
RDW: 13.6 % (ref 11.5–15.5)
RDW: 13.7 % (ref 11.5–15.5)
WBC: 6.1 10*3/uL (ref 4.0–10.5)
WBC: 7.3 10*3/uL (ref 4.0–10.5)

## 2015-11-10 LAB — ABO/RH: ABO/RH(D): O POS

## 2015-11-10 LAB — HEMOGLOBIN AND HEMATOCRIT, BLOOD
HCT: 38.9 % (ref 36.0–46.0)
HEMOGLOBIN: 12.6 g/dL (ref 12.0–15.0)

## 2015-11-10 LAB — TYPE AND SCREEN
ABO/RH(D): O POS
Antibody Screen: NEGATIVE

## 2015-11-10 MED ORDER — ACETAMINOPHEN 325 MG PO TABS: 650.0000 mg | ORAL_TABLET | Freq: Four times a day (QID) | ORAL | Status: DC | PRN

## 2015-11-10 MED ORDER — SODIUM CHLORIDE 0.9 % IV SOLN: INTRAVENOUS | Status: AC

## 2015-11-10 MED ORDER — INSULIN ASPART 100 UNIT/ML ~~LOC~~ SOLN: 0.0000 [IU] | Freq: Three times a day (TID) | SUBCUTANEOUS | Status: DC

## 2015-11-10 MED ORDER — ONDANSETRON HCL 4 MG PO TABS: 4.0000 mg | ORAL_TABLET | Freq: Four times a day (QID) | ORAL | Status: DC | PRN

## 2015-11-10 MED ORDER — SODIUM CHLORIDE 0.9% FLUSH: 3.0000 mL | Freq: Two times a day (BID) | INTRAVENOUS | Status: DC

## 2015-11-10 MED ORDER — ONDANSETRON HCL 4 MG/2ML IJ SOLN: 4.0000 mg | Freq: Four times a day (QID) | INTRAMUSCULAR | Status: DC | PRN

## 2015-11-10 MED ORDER — INSULIN ASPART 100 UNIT/ML ~~LOC~~ SOLN: 0.0000 [IU] | Freq: Every day | SUBCUTANEOUS | Status: DC

## 2015-11-10 MED ORDER — ACETAMINOPHEN 650 MG RE SUPP: 650.0000 mg | Freq: Four times a day (QID) | RECTAL | Status: DC | PRN

## 2015-11-10 MED ORDER — MORPHINE SULFATE (PF) 2 MG/ML IV SOLN: 1.0000 mg | INTRAVENOUS | Status: DC | PRN

## 2015-11-10 MED ORDER — PANTOPRAZOLE SODIUM 40 MG IV SOLR
40.0000 mg | Freq: Two times a day (BID) | INTRAVENOUS | Status: DC
  Administered 2015-11-10 – 2015-11-11 (×2): 40 mg via INTRAVENOUS
  Filled 2015-11-10 (×2): qty 40

## 2015-11-10 NOTE — Consult Note (Signed)
[facility] Consult Note  Referring Provider: No ref. provider found Primary Care Physician:  Shamleffer, Herschell Dimes, MD Primary Gastroenterologist:  Dr.  Laurel Dimmer Complaint: Rectal bleeding HPI: Melissa [name] is an 70 y.o. white female  who presents with a 1-2 day history of several episodes of moderate to large volume painless hematochezia. She has had a colon perforation presumably from diverticulitis about 20 years ago requiring a temporary colostomy. She had a colonoscopy 10-12 years ago estimated by Dr. Elicia Lamp which showed only diverticulosis. She is not on any blood thinners.  Past Medical History  Diagnosis Date  . Diverticulosis   . Hypertension   . Hypercholesteremia   . Diabetes mellitus without complication ([place])   . Kidney stones     Past Surgical History  Procedure Laterality Date  . Colon surgery    . Cesarean section      Medications Prior to Admission  Medication Sig Dispense Refill  . atorvastatin (LIPITOR) 10 MG tablet Take 10 mg by mouth every other day.    . losartan (COZAAR) 100 MG tablet Take 100 mg by mouth daily.    Marland Kitchen PRESCRIPTION MEDICATION Take 1 tablet by mouth daily. Fluid pill      Allergies: No Known Allergies  No family history on file.  Social History:  reports that she has quit smoking. She does not have any smokeless tobacco history on file. She reports that she drinks alcohol. She reports that she does not use illicit drugs.  Review of Systems: negative except As above   Blood pressure 133/57, pulse 70, temperature 97.6 F (36.4 C), temperature source Oral, resp. rate 18, height 5' 2.5" (1.588 m), weight 84.029 kg (185 lb 4 oz), SpO2 95 %. Head: Normocephalic, without obvious abnormality, atraumatic Neck: no adenopathy, no carotid bruit, no JVD, supple, symmetrical, trachea midline and thyroid not enlarged, symmetric, no tenderness/mass/nodules Resp: clear to auscultation bilaterally Cardio: regular rate and  rhythm, S1, S2 normal, no murmur, click, rub or gallop GI: Abdomen soft nondistended with normoactive bowel sounds. No hepatosplenomegaly mass or guarding. Extremities: extremities normal, atraumatic, no cyanosis or edema  Results for orders placed or performed during the hospital encounter of 11/10/15 (from the past 48 hour(s))  Type and screen [place]     Status: None   Collection Time: 11/09/15 11:56 PM  Result Value Ref Range   ABO/RH(D) O POS    Antibody Screen NEG    Sample Expiration 11/12/2015   ABO/Rh     Status: None   Collection Time: 11/09/15 11:56 PM  Result Value Ref Range   ABO/RH(D) O POS   Comprehensive metabolic panel     Status: Abnormal   Collection Time: 11/10/15 12:11 AM  Result Value Ref Range   Sodium 140 135 - 145 mmol/L   Potassium 3.8 3.5 - 5.1 mmol/L   Chloride 104 101 - 111 mmol/L   CO2 23 22 - 32 mmol/L   Glucose, Bld 184 (H) 65 - 99 mg/dL   BUN 15 6 - 20 mg/dL   Creatinine, Ser 0.77 0.44 - 1.00 mg/dL   Calcium 10.0 8.9 - 10.3 mg/dL   Total Protein 7.2 6.5 - 8.1 g/dL   Albumin 4.2 3.5 - 5.0 g/dL   AST 40 15 - 41 U/L   ALT 44 14 - 54 U/L   Alkaline Phosphatase 54 38 - 126 U/L   Total Bilirubin 0.7 0.3 - 1.2 mg/dL   GFR calc non Af Amer >60 >60  mL/min   GFR calc Af Amer >60 >60 mL/min    Comment: (NOTE) The eGFR has been calculated using the CKD EPI equation. This calculation has not been validated in all clinical situations. eGFR's persistently <60 mL/min signify possible Chronic Kidney Disease.    Anion gap 13 5 - 15  CBC     Status: None   Collection Time: 11/10/15 12:11 AM  Result Value Ref Range   WBC 7.3 4.0 - 10.5 K/uL   RBC 4.18 3.87 - 5.11 MIL/uL   Hemoglobin 12.5 12.0 - 15.0 g/dL   HCT 39.2 36.0 - 46.0 %   MCV 93.8 78.0 - 100.0 fL   MCH 29.9 26.0 - 34.0 pg   MCHC 31.9 30.0 - 36.0 g/dL   RDW 13.6 11.5 - 15.5 %   Platelets 264 150 - 400 K/uL  Urinalysis, Routine w reflex microscopic (not at [facility])     Status:  None   Collection Time: 11/10/15  5:00 AM  Result Value Ref Range   Color, Urine YELLOW YELLOW   APPearance CLEAR CLEAR   Specific Gravity, Urine 1.019 1.005 - 1.030   pH 5.5 5.0 - 8.0   Glucose, UA NEGATIVE NEGATIVE mg/dL   Hgb urine dipstick NEGATIVE NEGATIVE   Bilirubin Urine NEGATIVE NEGATIVE   Ketones, ur NEGATIVE NEGATIVE mg/dL   Protein, ur NEGATIVE NEGATIVE mg/dL   Nitrite NEGATIVE NEGATIVE   Leukocytes, UA NEGATIVE NEGATIVE    Comment: MICROSCOPIC NOT DONE ON URINES WITH NEGATIVE PROTEIN, BLOOD, LEUKOCYTES, NITRITE, OR GLUCOSE <1000 mg/dL.  Hemoglobin and hematocrit, blood     Status: None   Collection Time: 11/10/15  5:15 AM  Result Value Ref Range   Hemoglobin 12.6 12.0 - 15.0 g/dL   HCT 38.9 36.0 - 46.0 %  Glucose, capillary     Status: Abnormal   Collection Time: 11/10/15  7:27 AM  Result Value Ref Range   Glucose-Capillary 150 (H) 65 - 99 mg/dL   Dg Abd 1 View  11/10/2015  CLINICAL DATA:  GI bleeding for 2 days. EXAM: ABDOMEN - 1 VIEW COMPARISON:  11/10/2007 FINDINGS: The bowel gas pattern is normal. Rectal region bowel sutures are noted. No concerning intra-abdominal mass effect or calcification. Lung bases are clear. IMPRESSION: Negative. Electronically Signed   By: Monte Fantasia M.D.   On: 11/10/2015 05:29    Assessment: Lower GI bleed, presumed diverticular Plan:  Monitor stools and hemoglobin Dear liquid diet today If severe bleeding will get pooled RBC scan otherwise reassess tomorrow and consider prep for colonoscopy versus discharge and outpatient follow-up if bleeding ceases. We'll follow with you. Hernando Reali C 11/10/2015, 8:44 AM  Pager [phone] If no answer or after 5 PM call [phone]

## 2015-11-10 NOTE — ED Provider Notes (Signed)
CSN: [id number]     Arrival date & time 11/09/15  2323 History  By signing my name below, I, Arianna Nassar, attest that this documentation has been prepared under the direction and in the presence of Shon Batonourtney F Hollie Bartus, MD. Electronically Signed: Octavia HeirArianna Nassar, ED Scribe. 11/10/2015. 4:12 AM.    Chief Complaint  Patient presents with  . GI Bleeding     The history is provided by the patient. No language interpreter was used.   HPI Comments: Melissa [name] is a 70 y.o. female who has a hx of HTN, hypercholesteremia, DM, and diverticulosis presents to the Emergency Department complaining of intermittent, gradual worsening, moderate, blood in stool with associated right flank pain and headache onset two days ago. Pt reports she has a hx of perforated colon and diverticulosis. Patient reported that she had a bowel movement last night with one episode of bright red blood. Pt reports this afternoon around 4 pm, she had a bowel movement and had blood completely fill the toilet.  She had a separate episode at 10 PM which was grossly bloody.  She denies abdominal pain but does report right back pain. Denies any vomiting. Pt reports that her pain increases with movement. She reports having two episodes similar. Pt has not had these kind of symptoms before. She currently takes aspirin. Denies vomiting, dysuria, hematuria, chest pain, and shortness of breath.  Past Medical History  Diagnosis Date  . Diverticulosis   . Hypertension   . Hypercholesteremia   . Diabetes mellitus without complication (HCC)   . Kidney stones    Past Surgical History  Procedure Laterality Date  . Colon surgery    . Cesarean section     No family history on file. Social History  Substance Use Topics  . Smoking status: Former Games developermoker  . Smokeless tobacco: None  . Alcohol Use: Yes     Comment: occasional   OB History    No data available     Review of Systems  Constitutional: Negative for fever.  Respiratory:  Negative for shortness of breath.   Cardiovascular: Negative for chest pain.  Gastrointestinal: Negative for vomiting and abdominal pain.  Genitourinary: Positive for flank pain. Negative for dysuria and hematuria.  All other systems reviewed and are negative.     Allergies  Review of patient's allergies indicates no known allergies.  Home Medications   Prior to Admission medications   Medication Sig Start Date End Date Taking? Authorizing Provider  atorvastatin (LIPITOR) 10 MG tablet Take 10 mg by mouth every other day.   Yes Historical Provider, MD  losartan (COZAAR) 100 MG tablet Take 100 mg by mouth daily.   Yes Historical Provider, MD  PRESCRIPTION MEDICATION Take 1 tablet by mouth daily. Fluid pill   Yes Historical Provider, MD   Triage vitals: BP 127/62 mmHg  Pulse 73  Temp(Src) 98.2 F (36.8 C) (Oral)  Resp 16  Ht 5' 2.5" (1.588 m)  Wt 185 lb 4 oz (84.029 kg)  BMI 33.32 kg/m2  SpO2 96% Physical Exam  Constitutional: She is oriented to person, place, and time. She appears well-developed and well-nourished.  HENT:  Head: Normocephalic and atraumatic.  Cardiovascular: Normal rate, regular rhythm and normal heart sounds.   No murmur heard. Pulmonary/Chest: Effort normal and breath sounds normal. No respiratory distress. She has no wheezes.  Abdominal: Soft. Bowel sounds are normal. There is no tenderness. There is no rebound and no guarding.  Genitourinary:  No CVA tenderness Gross blood on  rectal exam  Neurological: She is alert and oriented to person, place, and time.  Skin: Skin is warm and dry.  Psychiatric: She has a normal mood and affect.  Nursing note and vitals reviewed.   ED Course  Procedures  DIAGNOSTIC STUDIES: Oxygen Saturation is 96% on RA, adequate by my interpretation.  COORDINATION OF CARE:  4:09 AM Will order abdominal x-ray. Discussed treatment plan which includes rectal exam and lab work with pt at bedside and pt agreed to plan.  Labs  Review Labs Reviewed  COMPREHENSIVE METABOLIC PANEL - Abnormal; Notable for the following:    Glucose, Bld 184 (*)    All other components within normal limits  CBC  HEMOGLOBIN AND HEMATOCRIT, BLOOD  OCCULT BLOOD X 1 CARD TO LAB, STOOL  URINALYSIS, ROUTINE W REFLEX MICROSCOPIC (NOT AT [facility])  POC OCCULT BLOOD, ED  TYPE AND SCREEN  ABO/RH    Imaging Review Dg Abd 1 View  11/10/2015  CLINICAL DATA:  GI bleeding for 2 days. EXAM: ABDOMEN - 1 VIEW COMPARISON:  11/10/2007 FINDINGS: The bowel gas pattern is normal. Rectal region bowel sutures are noted. No concerning intra-abdominal mass effect or calcification. Lung bases are clear. IMPRESSION: Negative. Electronically Signed   By: Marnee Spring M.D.   On: 11/10/2015 05:29   I have personally reviewed and evaluated these images and lab results as part of my medical decision-making.   EKG Interpretation None      MDM   Final diagnoses:  Gastrointestinal hemorrhage associated with intestinal diverticulosis   Patient presents with multiple bloody stools. History of diverticulosis. She also reports a history of perforated colon; however, she is not having any pain at this time. Abdominal exam is benign. Vital signs stable. Initial hemoglobin is 12.5. Last hemoglobin is 14. She's not having any active exsanguination on exam but does have gross blood per rectum. Repeat hemoglobin is stable. Given her age, will discuss with GI regarding admission for colonoscopy. Discussed with Dr. Dulce Sellar. He agrees with admission for further workup.  Discussed with Dr. Toniann Fail admitting hospitalist.  I personally performed the services described in this documentation, which was scribed in my presence. The recorded information has been reviewed and is accurate.   Shon Baton, MD 11/10/15 [phone]

## 2015-11-10 NOTE — ED Notes (Signed)
Pt taken to xray 

## 2015-11-10 NOTE — Plan of Care (Signed)
70 year old female presenting with rectal bleeding. Hemglobin repeat is stable. GI Dr.Outlaw was consulted by EDP. Possibly diverticular bleed.  Melissa [name].

## 2015-11-10 NOTE — Progress Notes (Signed)
Patient had one bloody stool occurrence.

## 2015-11-10 NOTE — H&P (Signed)
[facility] History and Physical  Melissa [name] ZOX:[id number] DOB: 02-17-1946 DOA: 11/10/2015  Referring physician: Toniann Fail PCP: Lonzo Cloud, Landry Mellow, MD   Chief Complaint: BRBPR  HPI: Melissa [name] is a very pleasant 70 y.o. female with a past medical history that includes diverticulosis, hypertension, diabetes, kidney stone, peptic ulcer disease presents to the emergency department chief complaint of bright red blood per rectum. Initial evaluation in the emergency department concerning for GI bleed.  Patient reports gradual worsening bloody stools associated with right flank pain and headache starting yesterday afternoon. She states initially she had a bowel movement that was blood-tinged in the afternoon later that night after eating she had another bowel movement that was all blood and "filled up the toilet". She denies abdominal pain nausea vomiting. sHe denies dizziness syncope near syncope. She denies any shortness of breath chest pain or palpitation. sHe does complain of some right flank pain worse with movement. sHe denies dysuria hematuria frequency or urgency. She takes 81 mg of aspirin a day otherwise denies any NSAID use. No blood thinners reported  In the emergency department she is hemodynamically stable afebrile and not hypoxic.   Review of Systems:  10 point review of systems complete and all systems are negative except as indicated in the history of present illness  Past Medical History  Diagnosis Date  . Diverticulosis   . Hypertension   . Hypercholesteremia   . Diabetes mellitus without complication (HCC)   . Kidney stones    Past Surgical History  Procedure Laterality Date  . Colon surgery    . Cesarean section     Social History:  reports that she has quit smoking. She does not have any smokeless tobacco history on file. She reports that she drinks alcohol. She reports that she does not use illicit drugs. She lives at home with her husband.  She is a retired Airline pilot. No Known Allergies  No family history on file. sHe has 6 siblings. Their collective medical history is positive for diabetes heart disease. She denies history of any cancer  Prior to Admission medications   Medication Sig Start Date End Date Taking? Authorizing Provider  atorvastatin (LIPITOR) 10 MG tablet Take 10 mg by mouth every other day.   Yes Historical Provider, MD  losartan (COZAAR) 100 MG tablet Take 100 mg by mouth daily.   Yes Historical Provider, MD  PRESCRIPTION MEDICATION Take 1 tablet by mouth daily. Fluid pill   Yes Historical Provider, MD   Physical Exam: Filed Vitals:   11/10/15 0405 11/10/15 0515 11/10/15 0600 11/10/15 0638  BP:  135/63 136/63 133/57  Pulse:  73 68 70  Temp:    97.6 F (36.4 C)  TempSrc:    Oral  Resp:   21 18  Height:      Weight:      SpO2: 96% 97% 100% 95%    Wt Readings from Last 3 Encounters:  11/09/15 84.029 kg (185 lb 4 oz)  12/10/08 82.668 kg (182 lb 4 oz)  06/14/08 81.92 kg (180 lb 9.6 oz)    General:  Appears calm and comfortable, younger than her stated age Eyes: PERRL, normal lids, irises & conjunctiva ENT: grossly normal hearing, lips & tongue, his membranes of her mouth are pink but dry Neck: no LAD, masses or thyromegaly Cardiovascular: RRR, no m/r/g. Trace lower extremity edema Telemetry: SR, no arrhythmias  Respiratory: CTA bilaterally, no w/r/r. Normal respiratory effort. Abdomen: soft, ntnd obese sluggish bowel sounds Skin: no rash  or induration seen on limited exam Musculoskeletal: grossly normal tone BUE/BLE Psychiatric: grossly normal mood and affect, speech fluent and appropriate Neurologic: grossly non-focal. Speech clear facial symmetry           Labs on Admission:  Basic Metabolic Panel:  Recent Labs Lab 11/10/15 0011  NA 140  K 3.8  CL 104  CO2 23  GLUCOSE 184*  BUN 15  CREATININE 0.77  CALCIUM 10.0   Liver Function Tests:  Recent Labs Lab 11/10/15 0011  AST 40   ALT 44  ALKPHOS 54  BILITOT 0.7  PROT 7.2  ALBUMIN 4.2   No results for input(s): LIPASE, AMYLASE in the last 168 hours. No results for input(s): AMMONIA in the last 168 hours. CBC:  Recent Labs Lab 11/10/15 0011 11/10/15 0515  WBC 7.3  --   HGB 12.5 12.6  HCT 39.2 38.9  MCV 93.8  --   PLT 264  --    Cardiac Enzymes: No results for input(s): CKTOTAL, CKMB, CKMBINDEX, TROPONINI in the last 168 hours.  BNP (last 3 results) No results for input(s): BNP in the last 8760 hours.  ProBNP (last 3 results) No results for input(s): PROBNP in the last 8760 hours.  CBG:  Recent Labs Lab 11/10/15 0727  GLUCAP 150*    Radiological Exams on Admission: Dg Abd 1 View  11/10/2015  CLINICAL DATA:  GI bleeding for 2 days. EXAM: ABDOMEN - 1 VIEW COMPARISON:  11/10/2007 FINDINGS: The bowel gas pattern is normal. Rectal region bowel sutures are noted. No concerning intra-abdominal mass effect or calcification. Lung bases are clear. IMPRESSION: Negative. Electronically Signed   By: Marnee SpringJonathon  Watts M.D.   On: 11/10/2015 05:29    EKG:   Assessment/Plan Principal Problem:   GI bleed Active Problems:   Diabetes (HCC)   Anxiety state   Essential hypertension   GERD   BRBPR (bright red blood per rectum)   Obesity   Flank pain  #1. GI bleed/bright red blood per rectum. Likely diverticular bleed. Patient with a history of diverticulosis and perforated colon. Takes 81 mg aspirin daily. Last colonoscopy 10 years ago. Abdominal x-ray negative. Hemoglobin 12.6. She is hemodynamically stable -Admit to telemetry -Keep nothing by mouth until evaluated by gastroenterology -Gentle IV fluids -Serial CBCs -hold aspirin -Protonix  every 12 -await GI recommendations  #2. Diabetes. Diet controlled. Serum glucose 184 on admission. Patient reports last A1c 6.9. -carb modified diet when able to eat -Obtain a hemoglobin A1c -Use sliding scale insulin for optimal control  #3. Hypertension.  Currently controlled. Home medications include losartan. -hold losartan -Monitor closely  #4. Anxiety. Appears stable at baseline  #5. Right flank pain. Likely musculoskeletal. Worse with movement and reproducible. Does have a history of kidney stones. Urinalysis unremarkable. -Gentle IV fluids -Analgesia as needed -Monitor urine output -Outpatient follow-up   Per ED note, Dr Dulce Sellarutlaw with GI aware of consult  Code Status: full DVT Prophylaxis: Family Communication: none present Disposition Plan: home when ready  Time spent: 50 minutes  [facility] County Medical CenterBLACK,Bran Aldridge M [facility]

## 2015-11-11 DIAGNOSIS — E669 Obesity, unspecified: Secondary | ICD-10-CM | POA: Diagnosis not present

## 2015-11-11 DIAGNOSIS — K5731 Diverticulosis of large intestine without perforation or abscess with bleeding: Secondary | ICD-10-CM

## 2015-11-11 DIAGNOSIS — I10 Essential (primary) hypertension: Secondary | ICD-10-CM

## 2015-11-11 DIAGNOSIS — K5791 Diverticulosis of intestine, part unspecified, without perforation or abscess with bleeding: Secondary | ICD-10-CM | POA: Diagnosis not present

## 2015-11-11 LAB — BASIC METABOLIC PANEL
Anion gap: 10 (ref 5–15)
BUN: 9 mg/dL (ref 6–20)
CALCIUM: 9.3 mg/dL (ref 8.9–10.3)
CO2: 26 mmol/L (ref 22–32)
CREATININE: 0.68 mg/dL (ref 0.44–1.00)
Chloride: 107 mmol/L (ref 101–111)
GFR calc Af Amer: >60 mL/min (ref >60)
GLUCOSE: 145 mg/dL — AB (ref 65–99)
Potassium: 4.4 mmol/L (ref 3.5–5.1)
SODIUM: 143 mmol/L (ref 135–145)

## 2015-11-11 LAB — CBC
HEMATOCRIT: 37.3 % (ref 36.0–46.0)
Hemoglobin: 11.8 g/dL — ABNORMAL LOW (ref 12.0–15.0)
MCH: 29.9 pg (ref 26.0–34.0)
MCHC: 31.6 g/dL (ref 30.0–36.0)
MCV: 94.4 fL (ref 78.0–100.0)
PLATELETS: 221 10*3/uL (ref 150–400)
RBC: 3.95 MIL/uL (ref 3.87–5.11)
RDW: 13.5 % (ref 11.5–15.5)
WBC: 6.1 10*3/uL (ref 4.0–10.5)

## 2015-11-11 LAB — GLUCOSE, CAPILLARY
GLUCOSE-CAPILLARY: 96 mg/dL (ref 65–99)
GLUCOSE-CAPILLARY: 139 mg/dL — AB (ref 65–99)

## 2015-11-11 LAB — HEMOGLOBIN A1C
Hgb A1c MFr Bld: 7.2 % — ABNORMAL HIGH (ref 4.8–5.6)
MEAN PLASMA GLUCOSE: 160 mg/dL

## 2015-11-11 NOTE — Progress Notes (Signed)
[facility] Progress Note  Subjective: The patient states she had one small stool with some dried blood much improved from previous appearance.  Objective: Vital signs in last 24 hours: Temp:  [97.6 F (36.4 C)-98.2 F (36.8 C)] 97.7 F (36.5 C) (03/14 0500) Pulse Rate:  [62-65] 65 (03/14 0500) Resp:  [18] 18 (03/14 0500) BP: (123-145)/(48-64) 135/48 mmHg (03/14 0500) SpO2:  [95 %-97 %] 95 % (03/14 0500) Weight change:    PE: Unchanged  Lab Results: Results for orders placed or performed during the hospital encounter of 11/10/15 (from the past 24 hour(s))  Glucose, capillary     Status: Abnormal   Collection Time: 11/10/15 12:45 PM  Result Value Ref Range   Glucose-Capillary 148 (H) 65 - 99 mg/dL   Comment 1 Notify RN    Comment 2 Document in Chart   CBC     Status: Abnormal   Collection Time: 11/10/15  2:09 PM  Result Value Ref Range   WBC 6.1 4.0 - 10.5 K/uL   RBC 3.99 3.87 - 5.11 MIL/uL   Hemoglobin 11.9 (L) 12.0 - 15.0 g/dL   HCT 11.9 14.7 - 82.9 %   MCV 94.0 78.0 - 100.0 fL   MCH 29.8 26.0 - 34.0 pg   MCHC 31.7 30.0 - 36.0 g/dL   RDW 56.2 13.0 - 86.5 %   Platelets 235 150 - 400 K/uL  Glucose, capillary     Status: Abnormal   Collection Time: 11/10/15  4:08 PM  Result Value Ref Range   Glucose-Capillary 119 (H) 65 - 99 mg/dL   Comment 1 Notify RN    Comment 2 Document in Chart   Glucose, capillary     Status: None   Collection Time: 11/10/15 11:01 PM  Result Value Ref Range   Glucose-Capillary 96 65 - 99 mg/dL  Basic metabolic panel     Status: Abnormal   Collection Time: 11/11/15  6:09 AM  Result Value Ref Range   Sodium 143 135 - 145 mmol/L   Potassium 4.4 3.5 - 5.1 mmol/L   Chloride 107 101 - 111 mmol/L   CO2 26 22 - 32 mmol/L   Glucose, Bld 145 (H) 65 - 99 mg/dL   BUN 9 6 - 20 mg/dL   Creatinine, Ser 7.84 0.44 - 1.00 mg/dL   Calcium 9.3 8.9 - 69.6 mg/dL   GFR calc non Af Amer >60 >60 mL/min   GFR calc Af Amer >60 >60 mL/min   Anion gap  10 5 - 15  CBC     Status: Abnormal   Collection Time: 11/11/15  6:09 AM  Result Value Ref Range   WBC 6.1 4.0 - 10.5 K/uL   RBC 3.95 3.87 - 5.11 MIL/uL   Hemoglobin 11.8 (L) 12.0 - 15.0 g/dL   HCT 29.5 28.4 - 13.2 %   MCV 94.4 78.0 - 100.0 fL   MCH 29.9 26.0 - 34.0 pg   MCHC 31.6 30.0 - 36.0 g/dL   RDW 44.0 10.2 - 72.5 %   Platelets 221 150 - 400 K/uL  Glucose, capillary     Status: Abnormal   Collection Time: 11/11/15  6:50 AM  Result Value Ref Range   Glucose-Capillary 139 (H) 65 - 99 mg/dL    Studies/Results: Dg Abd 1 View  11/10/2015  CLINICAL DATA:  GI bleeding for 2 days. EXAM: ABDOMEN - 1 VIEW COMPARISON:  11/10/2007 FINDINGS: The bowel gas pattern is normal. Rectal region bowel sutures are noted. No concerning  intra-abdominal mass effect or calcification. Lung bases are clear. IMPRESSION: Negative. Electronically Signed   By: Marnee SpringJonathon  Watts M.D.   On: 11/10/2015 05:29      Assessment: Probable diverticular bleed, possibly stopped, hemodynamically stable  Plan: Okay to discharge if next bowel movement not grossly bloody, possibly later today or tomorrow. If she goes home today I will arrange follow-up with me for updated colonoscopy.    Alicja Everitt C 11/11/2015, 10:20 AM  Pager [phone] If no answer or after 5 PM call [phone]

## 2015-11-11 NOTE — Discharge Summary (Signed)
Discharge Summary  Melissa [name] [id number] DOB: 1946-07-03  PCP: Tommy RainwaterShamleffer, [name], MD  Admit date: 11/10/2015 Discharge date: 11/11/2015  Time spent:  25 minutes   Recommendations for Outpatient Follow-up:  1.  patient has a follow-up with her PCP tomorrow. Advised she keep that   Discharge Diagnoses:  Active Hospital Problems   Diagnosis Date Noted  . GI bleed 11/10/2015  . BRBPR (bright red blood per rectum) 11/10/2015  . Obesity 11/10/2015  . Flank pain 11/10/2015  . Lower GI bleed   . Diabetes mellitus with complication (HCC)   . Diabetes (HCC) 06/13/2008  . Anxiety state 06/13/2008  . Essential hypertension 06/13/2008  . GERD 06/13/2008    Resolved Hospital Problems   Diagnosis Date Noted Date Resolved  No resolved problems to display.    Discharge Condition:  Improved, being discharged home   Diet recommendation:  Low residue diet   Filed Vitals:   11/10/15 2300 11/11/15 0500  BP: 123/64 135/48  Pulse: 62 65  Temp: 98.2 F (36.8 C) 97.7 F (36.5 C)  Resp: 18 18    History of present illness:   patient is a 70 year old female past history diabetes, hypertension and diverticulosis  Admitted on 3/13 after having 1 day history of continued bright red blood per rectum.   Hemoglobin on admission at 12.5 and vital signs stable.  Hospital Course:  Principal Problem:  Diverticular bleed: Patient observed overnight. Seen by GI who recommended plans for colonoscopy if bleeding persisting.  By later that day, hemoglobin dropped to 11.9. Bleeding had ceased by evening of 3/13. The following morning, hemoglobin stable 11.8. Patient with no further bleeding. Vital signs stable. This point, she was comfortable going home. No abdominal pain. She deferred about getting an outpatient colonoscopy, given that 10 years ago, she had some complications from her previous colonoscopy which led to a partial colonic resection. She has plans to follow up with her PCP  tomorrow for a previously scheduled visit. Active Problems:   Diabetes [facility](HCC): Mostly well controlled, A1c at 7.2    Obesity:Patient  Meets criteria with BMI greater than 39   Essential hypertension: Blod   GERD   BRBPR (bright red blood per rectum)   Obesity: Patient is criteria with BMI greater than 30   Flank pain   Lower GI bleed   Diabetes mellitus with complication   Consultations:   gastroenterology  Procedures: None  Discharge Exam: BP 135/48 mmHg  Pulse 65  Temp(Src) 97.7 F (36.5 C) (Oral)  Resp 18  Ht 5' 2.5" (1.588 m)  Wt 84.029 kg (185 lb 4 oz)  BMI 33.32 kg/m2  SpO2 95%  General:  Alert and oriented 3  Cardiovascular:  Regular rate and rhythm, S1-S2  Respiratory:  Clear to auscultation bilaterally   Discharge Instructions You were cared for by a hospitalist during your hospital stay. If you have any questions about your discharge medications or the care you received while you were in the hospital after you are discharged, you can call the unit and asked to speak with the hospitalist on call if the hospitalist that took care of you is not available. Once you are discharged, your primary care physician will handle any further medical issues. Please note that NO REFILLS for any discharge medications will be authorized once you are discharged, as it is imperative that you return to your primary care physician (or establish a relationship with a primary care physician if you do not have one) for your  aftercare needs so that they can reassess your need for medications and monitor your lab values.  Discharge Instructions    Diet - low sodium heart healthy    Complete by:  As directed      Increase activity slowly    Complete by:  As directed             Medication List    TAKE these medications        atorvastatin 10 MG tablet  Commonly known as:  LIPITOR  Take 10 mg by mouth every other day.     losartan 100 MG tablet  Commonly known as:  COZAAR  Take  100 mg by mouth daily.     PRESCRIPTION MEDICATION  Take 1 tablet by mouth daily. Fluid pill       No Known Allergies     Follow-up Information    Follow up with HAYES,JOHN C, MD In 2 weeks.   Specialty:  Gastroenterology   Contact information:   [address] [place] [address] [phone]        The results of significant diagnostics from this hospitalization (including imaging, microbiology, ancillary and laboratory) are listed below for reference.    Significant Diagnostic Studies: Dg Abd 1 View  11/10/2015  CLINICAL DATA:  GI bleeding for 2 days. EXAM: ABDOMEN - 1 VIEW COMPARISON:  11/10/2007 FINDINGS: The bowel gas pattern is normal. Rectal region bowel sutures are noted. No concerning intra-abdominal mass effect or calcification. Lung bases are clear. IMPRESSION: Negative. Electronically Signed   By: Marnee Spring M.D.   On: 11/10/2015 05:29    Microbiology: No results found for this or any previous visit (from the past 240 hour(s)).   Labs: Basic Metabolic Panel:  Recent Labs Lab 11/10/15 0011 11/11/15 0609  NA 140 143  K 3.8 4.4  CL 104 107  CO2 23 26  GLUCOSE 184* 145*  BUN 15 9  CREATININE 0.77 0.68  CALCIUM 10.0 9.3   Liver Function Tests:  Recent Labs Lab 11/10/15 0011  AST 40  ALT 44  ALKPHOS 54  BILITOT 0.7  PROT 7.2  ALBUMIN 4.2   No results for input(s): LIPASE, AMYLASE in the last 168 hours. No results for input(s): AMMONIA in the last 168 hours. CBC:  Recent Labs Lab 11/10/15 0011 11/10/15 0515 11/10/15 1409 11/11/15 0609  WBC 7.3  --  6.1 6.1  HGB 12.5 12.6 11.9* 11.8*  HCT 39.2 38.9 37.5 37.3  MCV 93.8  --  94.0 94.4  PLT 264  --  235 221   Cardiac Enzymes: No results for input(s): CKTOTAL, CKMB, CKMBINDEX, TROPONINI in the last 168 hours. BNP: BNP (last 3 results) No results for input(s): BNP in the last 8760 hours.  ProBNP (last 3 results) No results for input(s): PROBNP in the last 8760  hours.  CBG:  Recent Labs Lab 11/10/15 0727 11/10/15 1245 11/10/15 1608 11/10/15 2301 11/11/15 0650  GLUCAP 150* 148* 119* 96 139*       Signed:  Vernell Townley K  [facility] 11/11/2015, 4:31 PM

## 2015-11-11 NOTE — Discharge Instructions (Signed)
Diverticulosis Diverticulosis is the condition that develops when small pouches (diverticula) form in the wall of your colon. Your colon, or large intestine, is where water is absorbed and stool is formed. The pouches form when the inside layer of your colon pushes through weak spots in the outer layers of your colon. CAUSES  No one knows exactly what causes diverticulosis. RISK FACTORS  Being older than 50. Your risk for this condition increases with age. Diverticulosis is rare in people younger than 40 years. By age 80, almost everyone has it.  Eating a low-fiber diet.  Being frequently constipated.  Being overweight.  Not getting enough exercise.  Smoking.  Taking over-the-counter pain medicines, like aspirin and ibuprofen. SYMPTOMS  Most people with diverticulosis do not have symptoms. DIAGNOSIS  Because diverticulosis often has no symptoms, health care providers often discover the condition during an exam for other colon problems. In many cases, a health care provider will diagnose diverticulosis while using a flexible scope to examine the colon (colonoscopy). TREATMENT  If you have never developed an infection related to diverticulosis, you may not need treatment. If you have had an infection before, treatment may include:  Eating more fruits, vegetables, and grains.  Taking a fiber supplement.  Taking a live bacteria supplement (probiotic).  Taking medicine to relax your colon. HOME CARE INSTRUCTIONS   Drink at least 6-8 glasses of water each day to prevent constipation.  Try not to strain when you have a bowel movement.  Keep all follow-up appointments. If you have had an infection before:  Increase the fiber in your diet as directed by your health care provider or dietitian.  Take a dietary fiber supplement if your health care provider approves.  Only take medicines as directed by your health care provider. SEEK MEDICAL CARE IF:   You have abdominal  pain.  You have bloating.  You have cramps.  You have not gone to the bathroom in 3 days. SEEK IMMEDIATE MEDICAL CARE IF:   Your pain gets worse.  Yourbloating becomes very bad.  You have a fever or chills, and your symptoms suddenly get worse.  You begin vomiting.  You have bowel movements that are bloody or black. MAKE SURE YOU:  Understand these instructions.  Will watch your condition.  Will get help right away if you are not doing well or get worse.   This information is not intended to replace advice given to you by your health care provider. Make sure you discuss any questions you have with your health care provider.   Document Released: 05/13/2004 Document Revised: 08/21/2013 Document Reviewed: 07/11/2013 Elsevier Interactive Patient Education 2016 [employer]  

## 2015-11-11 NOTE — Care Management Note (Signed)
Case Management Note  Patient Details  Name: Melissa JordanSandra F [name] MRN: [id number] Date of Birth: 1945/12/09  Subjective/Objective:          Admitted with GI bleed          Action/Plan: Spoke with patient and her husband, no therapy or equipment needs identified. Patient's husband able to assist her after discharge. No discharge needs identified.   Expected Discharge Date:                  Expected Discharge Plan:  Home/Self Care  In-House Referral:  NA  Discharge planning Services  CM Consult  Post Acute Care Choice:  NA Choice offered to:     DME Arranged:    DME Agency:     HH Arranged:    HH Agency:     Status of Service:  Completed, signed off  Medicare Important Message Given:    Date Medicare IM Given:    Medicare IM give by:    Date Additional Medicare IM Given:    Additional Medicare Important Message give by:     If discussed at Long Length of Stay Meetings, dates discussed:    Additional Comments:  Monica BectonKrieg, Cherye Gaertner Watson, RN 11/11/2015, 10:27 AM

## 2016-07-09 ENCOUNTER — Other Ambulatory Visit: Payer: Self-pay | Admitting: Internal Medicine

## 2016-07-09 DIAGNOSIS — Z1231 Encounter for screening mammogram for malignant neoplasm of breast: Secondary | ICD-10-CM

## 2016-08-16 ENCOUNTER — Ambulatory Visit
Admission: RE | Admit: 2016-08-16 | Discharge: 2016-08-16 | Disposition: A | Discharge status: Home / Self Care | Payer: Medicare Other | Source: Ambulatory Visit | Attending: Internal Medicine | Admitting: Internal Medicine

## 2016-08-16 DIAGNOSIS — Z1231 Encounter for screening mammogram for malignant neoplasm of breast: Secondary | ICD-10-CM

## 2016-09-10 ENCOUNTER — Other Ambulatory Visit: Payer: Self-pay | Admitting: Gastroenterology

## 2016-10-07 ENCOUNTER — Encounter (HOSPITAL_COMMUNITY): Payer: Self-pay | Admitting: *Deleted

## 2016-10-12 ENCOUNTER — Ambulatory Visit (HOSPITAL_COMMUNITY): Payer: Medicare Other | Admitting: Certified Registered Nurse Anesthetist

## 2016-10-12 ENCOUNTER — Ambulatory Visit (HOSPITAL_COMMUNITY)
Admission: RE | Admit: 2016-10-12 | Discharge: 2016-10-12 | Disposition: A | Discharge status: Home / Self Care | Payer: Medicare Other | Source: Ambulatory Visit | Attending: Gastroenterology | Admitting: Gastroenterology

## 2016-10-12 ENCOUNTER — Encounter (HOSPITAL_COMMUNITY)
Admission: RE | Disposition: A | Discharge status: Home / Self Care | Payer: Self-pay | Source: Ambulatory Visit | Attending: Gastroenterology

## 2016-10-12 ENCOUNTER — Encounter (HOSPITAL_COMMUNITY): Payer: Self-pay | Admitting: *Deleted

## 2016-10-12 DIAGNOSIS — K219 Gastro-esophageal reflux disease without esophagitis: Secondary | ICD-10-CM | POA: Diagnosis not present

## 2016-10-12 DIAGNOSIS — Z9842 Cataract extraction status, left eye: Secondary | ICD-10-CM | POA: Insufficient documentation

## 2016-10-12 DIAGNOSIS — Z79899 Other long term (current) drug therapy: Secondary | ICD-10-CM | POA: Insufficient documentation

## 2016-10-12 DIAGNOSIS — E119 Type 2 diabetes mellitus without complications: Secondary | ICD-10-CM | POA: Insufficient documentation

## 2016-10-12 DIAGNOSIS — E7800 Pure hypercholesterolemia, unspecified: Secondary | ICD-10-CM | POA: Diagnosis not present

## 2016-10-12 DIAGNOSIS — Z1211 Encounter for screening for malignant neoplasm of colon: Secondary | ICD-10-CM | POA: Insufficient documentation

## 2016-10-12 DIAGNOSIS — Z8711 Personal history of peptic ulcer disease: Secondary | ICD-10-CM | POA: Insufficient documentation

## 2016-10-12 DIAGNOSIS — Z8719 Personal history of other diseases of the digestive system: Secondary | ICD-10-CM | POA: Insufficient documentation

## 2016-10-12 DIAGNOSIS — I10 Essential (primary) hypertension: Secondary | ICD-10-CM | POA: Insufficient documentation

## 2016-10-12 DIAGNOSIS — Z9841 Cataract extraction status, right eye: Secondary | ICD-10-CM | POA: Insufficient documentation

## 2016-10-12 DIAGNOSIS — Z9049 Acquired absence of other specified parts of digestive tract: Secondary | ICD-10-CM | POA: Diagnosis not present

## 2016-10-12 DIAGNOSIS — Z87891 Personal history of nicotine dependence: Secondary | ICD-10-CM | POA: Diagnosis not present

## 2016-10-12 DIAGNOSIS — Z7982 Long term (current) use of aspirin: Secondary | ICD-10-CM | POA: Insufficient documentation

## 2016-10-12 HISTORY — PX: COLONOSCOPY WITH PROPOFOL: SHX5780

## 2016-10-12 LAB — GLUCOSE, CAPILLARY: GLUCOSE-CAPILLARY: 140 mg/dL — AB (ref 65–99)

## 2016-10-12 SURGERY — COLONOSCOPY WITH PROPOFOL
Anesthesia: Monitor Anesthesia Care

## 2016-10-12 MED ORDER — PROPOFOL 10 MG/ML IV BOLUS
INTRAVENOUS | Status: DC | PRN
  Administered 2016-10-12: 10 mg via INTRAVENOUS
  Administered 2016-10-12 (×2): 20 mg via INTRAVENOUS
  Administered 2016-10-12: 50 mg via INTRAVENOUS
  Administered 2016-10-12: 20 mg via INTRAVENOUS

## 2016-10-12 MED ORDER — SODIUM CHLORIDE 0.9 % IV SOLN: INTRAVENOUS | Status: DC

## 2016-10-12 MED ORDER — PROPOFOL 10 MG/ML IV BOLUS
INTRAVENOUS | Status: AC
  Filled 2016-10-12: qty 40

## 2016-10-12 MED ORDER — PROPOFOL 500 MG/50ML IV EMUL
INTRAVENOUS | Status: DC | PRN
  Administered 2016-10-12: 200 ug/kg/min via INTRAVENOUS

## 2016-10-12 MED ORDER — EPHEDRINE SULFATE-NACL 50-0.9 MG/10ML-% IV SOSY
PREFILLED_SYRINGE | INTRAVENOUS | Status: DC | PRN
  Administered 2016-10-12 (×2): 5 mg via INTRAVENOUS

## 2016-10-12 MED ORDER — LACTATED RINGERS IV SOLN
INTRAVENOUS | Status: DC
  Administered 2016-10-12: 11:00:00 via INTRAVENOUS

## 2016-10-12 SURGICAL SUPPLY — 22 items

## 2016-10-12 NOTE — Anesthesia Postprocedure Evaluation (Signed)
Anesthesia Post Note  Patient: Melissa [name]  Procedure(s) Performed: Procedure(s) (LRB): COLONOSCOPY WITH PROPOFOL (N/A)  Patient location during evaluation: PACU Anesthesia Type: MAC Level of consciousness: awake and alert Pain management: pain level controlled Vital Signs Assessment: post-procedure vital signs reviewed and stable Respiratory status: spontaneous breathing, nonlabored ventilation, respiratory function stable and patient connected to nasal cannula oxygen Cardiovascular status: stable and blood pressure returned to baseline Anesthetic complications: no       Last Vitals:  Vitals:   10/12/16 1150 10/12/16 1210  BP: (!) 133/42 (!) 145/59  Pulse:    Resp:    Temp:      Last Pain:  Vitals:   10/12/16 1149  TempSrc: Oral                 [name]

## 2016-10-12 NOTE — H&P (Signed)
Procedure: Screening colonoscopy. 01/16/2003 normal screening colonoscopy was performed. 1995 segmental sigmoid colon resection was performed to treat perforated diverticulum. 11/10/2015 [facility] hospitalization to treat suspected diverticular lower gastrointestinal bleed.  History: The patient is a 71 year old female born 1946-04-18. She is scheduled to undergo a repeat screening colonoscopy today.  Past medical history: Hypertension. Hypercholesterolemia. Psoriasis. Cesarean section. Sigmoid colon diverticula perforation requiring segmental sigmoid colon resection in 1995. Uterine polyp removed surgically. Bilateral cataract surgery.  Exam: The patient is alert and lying comfortably on the endoscopy stretcher. Abdomen is soft and nontender to palpation. Lungs are clear to auscultation. Cardiac exam reveals a regular rhythm.  Plan: Proceed with screening colonoscopy

## 2016-10-12 NOTE — Op Note (Signed)
[facility] Patient Name: Melissa [name] Procedure Date: 10/12/2016 MRN: [id number] Attending MD: Charolett Bumpers , MD Date of Birth: 12/24/1945 CSN: [id number] Age: 71 Admit Type: Outpatient Procedure:                Colonoscopy Indications:              Screening for colorectal malignant neoplasm.                            01/16/2003 normal screening colonoscopy was                            performed. 1995 segmental sigmoid colon resection                            was performed to treat perforated diverticulum. Providers:                Charolett Bumpers, MD, Dwain Sarna, RN, Harrington Challenger, Technician Referring MD:              Medicines:                Propofol per Anesthesia Complications:            No immediate complications. Estimated Blood Loss:     Estimated blood loss: none. Procedure:                Pre-Anesthesia Assessment:                           - Prior to the procedure, a History and Physical                            was performed, and patient medications and                            allergies were reviewed. The patient's tolerance of                            previous anesthesia was also reviewed. The risks                            and benefits of the procedure and the sedation                            options and risks were discussed with the patient.                            All questions were answered, and informed consent                            was obtained. Prior Anticoagulants: The patient has                            taken aspirin, last  dose was 1 day prior to                            procedure. ASA Grade Assessment: II - A patient                            with mild systemic disease. After reviewing the                            risks and benefits, the patient was deemed in                            satisfactory condition to undergo the procedure.                           After obtaining  informed consent, the colonoscope                            was passed under direct vision. Throughout the                            procedure, the patient's blood pressure, pulse, and                            oxygen saturations were monitored continuously. The                            EC-[id number] ([id number] scope was introduced through                            the anus and advanced to the the cecum, identified                            by appendiceal orifice and ileocecal valve. The                            colonoscopy was performed without difficulty. The                            patient tolerated the procedure well. The quality                            of the bowel preparation was good. The appendiceal                            orifice and the rectum were photographed. Scope In: 11:30:50 AM Scope Out: 11:44:01 AM Scope Withdrawal Time: 0 hours 9 minutes 43 seconds  Total Procedure Duration: 0 hours 13 minutes 11 seconds  Findings:      The perianal and digital rectal examinations were normal.      The entire examined colon appeared normal. Left colonic diverticulosis       was present. Impression:               - The entire examined colon is  normal.                           - No specimens collected. Moderate Sedation:      N/A- Per Anesthesia Care Recommendation:           - Patient has a contact number available for                            emergencies. The signs and symptoms of potential                            delayed complications were discussed with the                            patient. Return to normal activities tomorrow.                            Written discharge instructions were provided to the                            patient.                           - Repeat colonoscopy in 10 years for screening                            purposes.                           - Resume previous diet.                           - Continue present  medications. Procedure Code(s):        --- Professional ---                           Z6109, Colorectal cancer screening; colonoscopy on                            individual not meeting criteria for high risk Diagnosis Code(s):        --- Professional ---                           Z12.11, Encounter for screening for malignant                            neoplasm of colon CPT copyright 2016 [employer]. All rights reserved. The codes documented in this report are preliminary and upon coder review may  be revised to meet current compliance requirements. Danise Edge, MD Charolett Bumpers, MD 10/12/2016 11:48:43 AM This report has been signed electronically. Number of Addenda: 0

## 2016-10-12 NOTE — Anesthesia Preprocedure Evaluation (Addendum)
Anesthesia Evaluation  Patient identified by MRN, date of birth, ID band Patient awake    Reviewed: Allergy & Precautions, NPO status , Patient's Chart, lab work & pertinent test results  Airway Mallampati: II  TM Distance: >3 FB Neck ROM: Full    Dental   Pulmonary former smoker,    breath sounds clear to auscultation       Cardiovascular hypertension, Pt. on medications  Rhythm:Regular Rate:Normal     Neuro/Psych Anxiety Depression    GI/Hepatic Neg liver ROS, PUD, GERD  ,  Endo/Other  diabetes, Type 2  Renal/GU negative Renal ROS     Musculoskeletal   Abdominal   Peds  Hematology negative hematology ROS (+)   Anesthesia Other Findings   Reproductive/Obstetrics                            Anesthesia Physical Anesthesia Plan  ASA: II  Anesthesia Plan: MAC   Post-op Pain Management:    Induction: Intravenous  Airway Management Planned: Natural Airway and Simple Face Mask  Additional Equipment:   Intra-op Plan:   Post-operative Plan:   Informed Consent: I have reviewed the patients History and Physical, chart, labs and discussed the procedure including the risks, benefits and alternatives for the proposed anesthesia with the patient or authorized representative who has indicated his/her understanding and acceptance.     Plan Discussed with: CRNA  Anesthesia Plan Comments:        Anesthesia Quick Evaluation

## 2016-10-12 NOTE — Discharge Instructions (Signed)

## 2016-10-12 NOTE — Transfer of Care (Signed)
Immediate Anesthesia Transfer of Care Note  Patient: Melissa [name]  Procedure(s) Performed: Procedure(s): COLONOSCOPY WITH PROPOFOL (N/A)  Patient Location: PACU  Anesthesia Type:MAC  Level of Consciousness: Patient easily awoken, sedated, comfortable, cooperative, following commands, responds to stimulation.   Airway & Oxygen Therapy: Patient spontaneously breathing, ventilating well, oxygen via simple oxygen mask.  Post-op Assessment: Report given to PACU RN, vital signs reviewed and stable, moving all extremities.   Post vital signs: Reviewed and stable.  Complications: No apparent anesthesia complications Last Vitals:  Vitals:   10/12/16 1016  BP: (!) 133/58  Pulse: 75  Resp: 10  Temp: 36.6 C    Last Pain:  Vitals:   10/12/16 1016  TempSrc: Oral         Complications: No apparent anesthesia complications

## 2016-10-15 ENCOUNTER — Encounter (HOSPITAL_COMMUNITY): Payer: Self-pay | Admitting: Gastroenterology

## 2018-01-14 IMAGING — CR DG ABDOMEN 1V
2 series · 2 of 2 positions shown · non-contrast
Comparison: 11/10/2007

CLINICAL DATA: GI bleeding for 2 days.

EXAM:
ABDOMEN - 1 VIEW

[abdomen kub (1 of 2)]
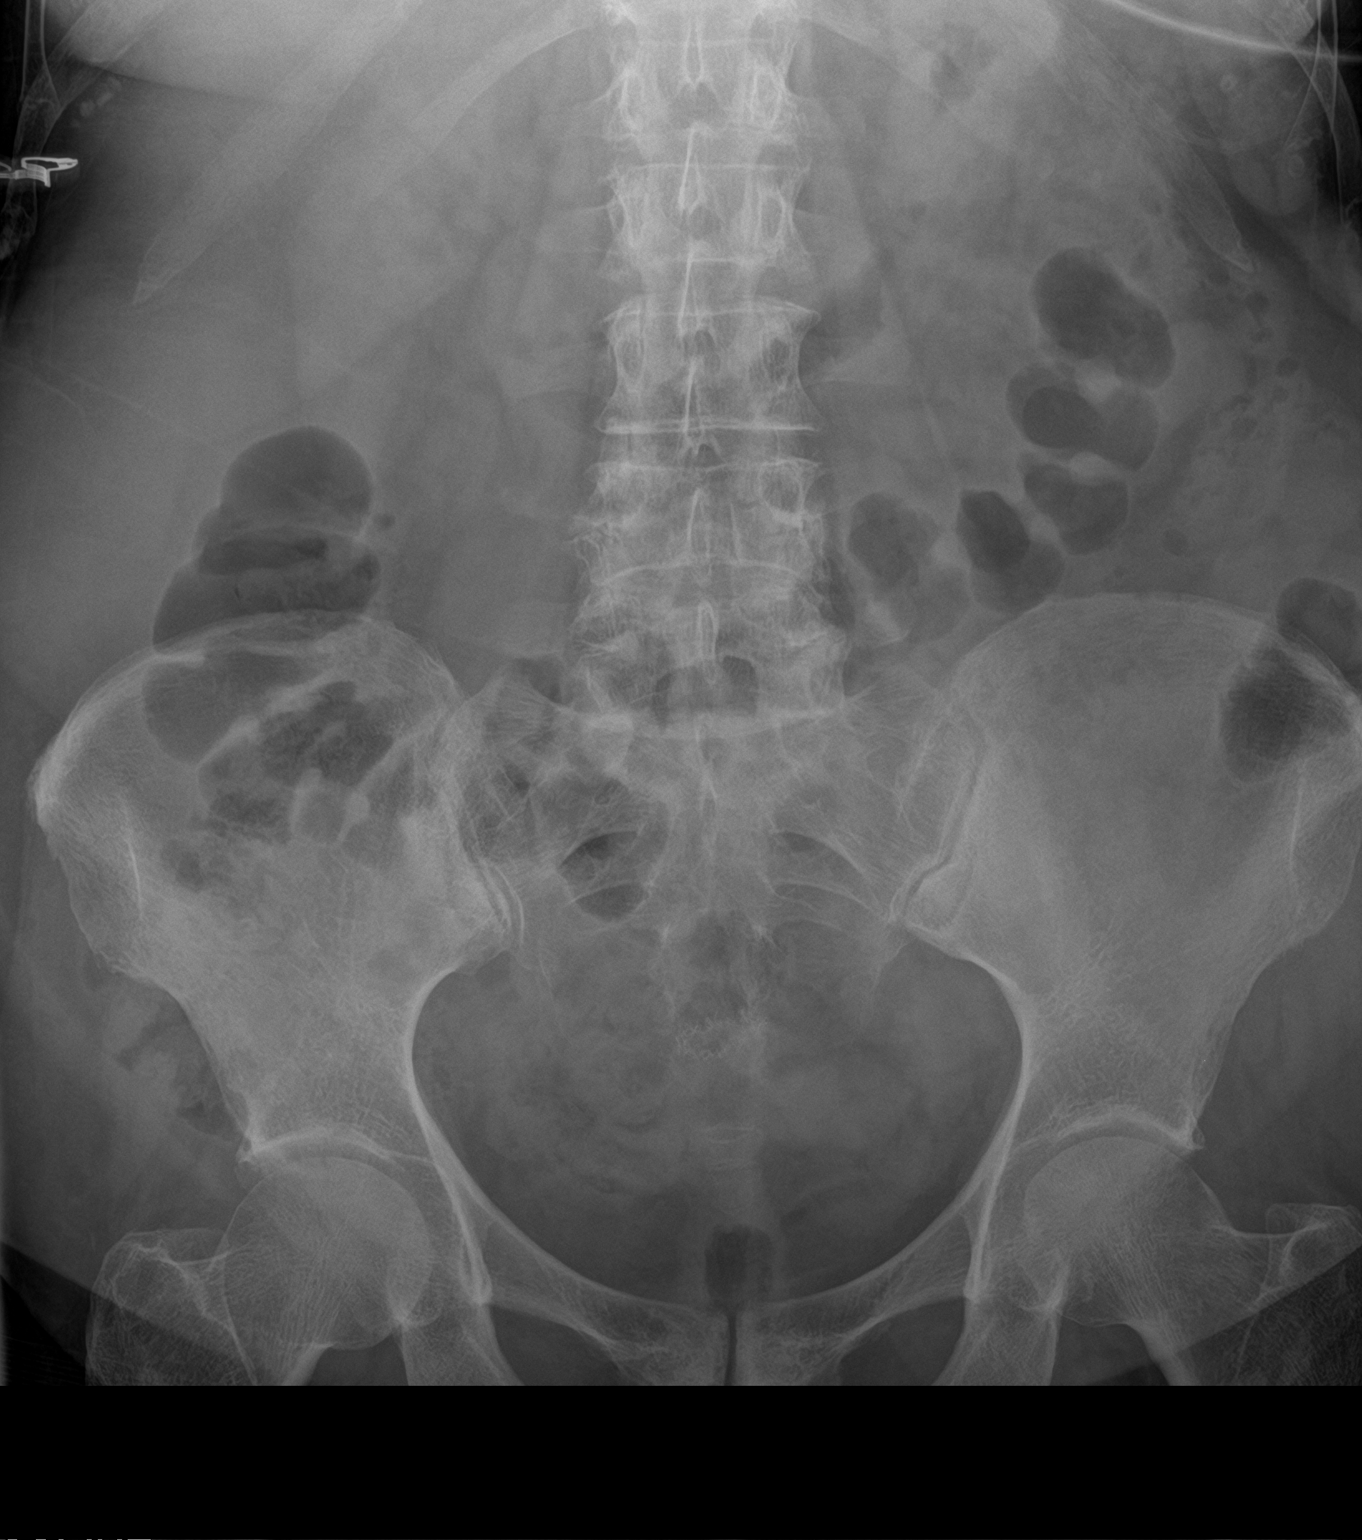

[abdomen kub (2 of 2)]
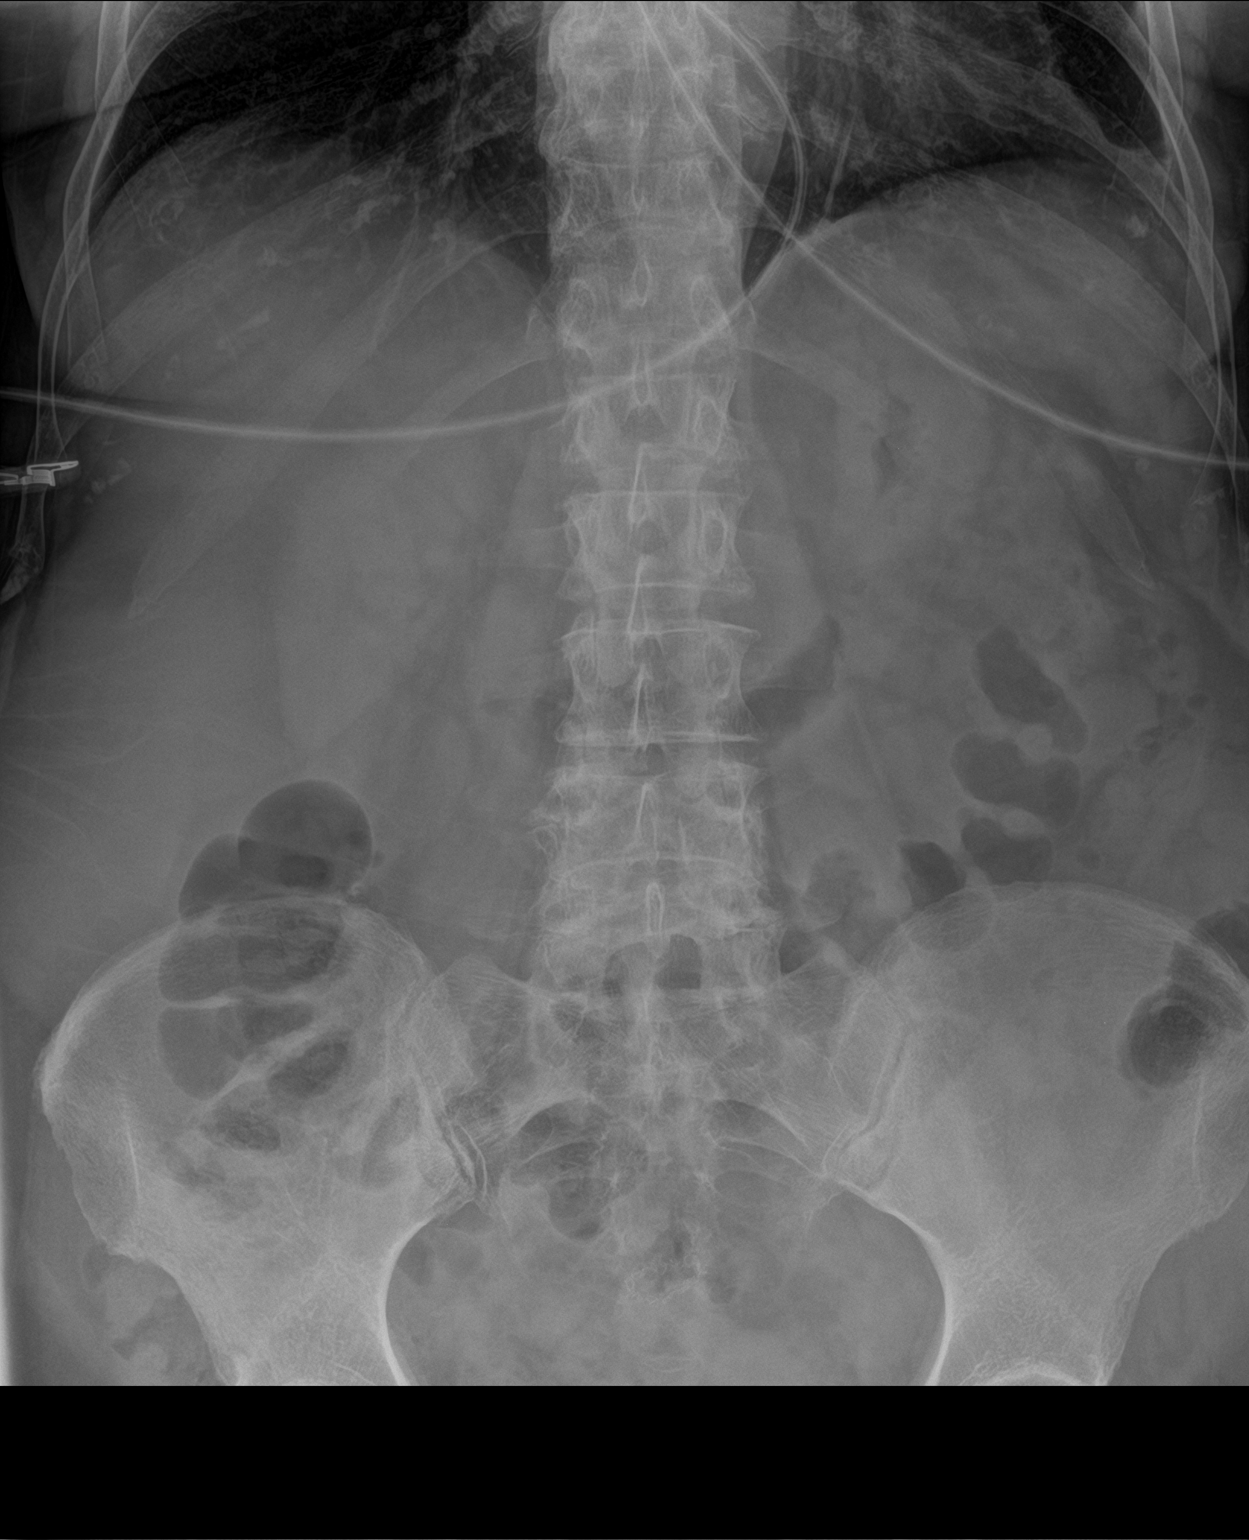

[2 of 2 positions shown; findings below may reference images not displayed]

FINDINGS: The bowel gas pattern is normal. Rectal region bowel sutures are
noted. No concerning intra-abdominal mass effect or calcification.
Lung bases are clear.
IMPRESSION: Negative.

## 2019-08-16 ENCOUNTER — Other Ambulatory Visit: Payer: Self-pay | Admitting: Internal Medicine

## 2020-04-08 ENCOUNTER — Encounter (INDEPENDENT_AMBULATORY_CARE_PROVIDER_SITE_OTHER): Payer: Self-pay | Admitting: Otolaryngology

## 2020-04-08 ENCOUNTER — Ambulatory Visit (INDEPENDENT_AMBULATORY_CARE_PROVIDER_SITE_OTHER): Payer: Medicare Other | Admitting: Otolaryngology

## 2020-04-08 ENCOUNTER — Other Ambulatory Visit: Payer: Self-pay

## 2020-04-08 VITALS — Temp 97.2°F

## 2020-04-08 DIAGNOSIS — M26609 Unspecified temporomandibular joint disorder, unspecified side: Secondary | ICD-10-CM

## 2020-04-08 DIAGNOSIS — H6123 Impacted cerumen, bilateral: Secondary | ICD-10-CM | POA: Diagnosis not present

## 2020-04-08 NOTE — Progress Notes (Signed)
HPI: Melissa [name] is a 74 y.o. female who presents for evaluation of ear complaints and jaw complaints.  She complains of blockage of hearing on the left side with history of drainage from the left ear.  She has had hearing aids that she has had for over 10 years.  She has occasionally had jaw pain on the right side that radiates into her ear and says that she had locked jaw a while back.  Apparently her daughter is a Education officer, community and she recommended follow-up with ENT.  She is having no jaw pain or ear pain today. She has used mineral all in her ears. She also reports a history of epistaxis from the right nostril several years ago.  Past Medical History:  Diagnosis Date  . Diabetes mellitus without complication (HCC)   . Diverticulosis   . Hypercholesteremia   . Hypertension   . Kidney stones    Past Surgical History:  Procedure Laterality Date  . CESAREAN SECTION    . COLON SURGERY    . COLONOSCOPY WITH PROPOFOL N/A 10/12/2016   Procedure: COLONOSCOPY WITH PROPOFOL;  Surgeon: Charolett Bumpers, MD;  Location: [facility];  Service: Endoscopy;  Laterality: N/A;   Social History   Socioeconomic History  . Marital status: Married    Spouse name: Not on file  . Number of children: Not on file  . Years of education: Not on file  . Highest education level: Not on file  Occupational History  . Not on file  Tobacco Use  . Smoking status: Former Smoker    Packs/day: 0.50    Years: 2.00    Pack years: 1.00  . Smokeless tobacco: Never Used  Substance and Sexual Activity  . Alcohol use: Yes    Comment: occasional  . Drug use: No  . Sexual activity: Not on file  Other Topics Concern  . Not on file  Social History Narrative  . Not on file   Social Determinants of Health   Financial Resource Strain:   . Difficulty of Paying Living Expenses:   Food Insecurity:   . Worried About Programme researcher, broadcasting/film/video in the Last Year:   . Barista in the Last Year:   Transportation Needs:   .  Freight forwarder (Medical):   Marland Kitchen Lack of Transportation (Non-Medical):   Physical Activity:   . Days of Exercise per Week:   . Minutes of Exercise per Session:   Stress:   . Feeling of Stress :   Social Connections:   . Frequency of Communication with Friends and Family:   . Frequency of Social Gatherings with Friends and Family:   . Attends Religious Services:   . Active Member of Clubs or Organizations:   . Attends Banker Meetings:   Marland Kitchen Marital Status:    No family history on file. Allergies  Allergen Reactions  . Ciprofloxin Hcl [Ciprofloxacin]    Prior to Admission medications   Medication Sig Start Date End Date Taking? Authorizing Provider  aspirin EC 81 MG tablet Take 81 mg by mouth every other day.   Yes [provider]  atorvastatin (LIPITOR) 10 MG tablet Take 10 mg by mouth every other day.   Yes [provider]  calcium carbonate (TUMS - DOSED IN MG ELEMENTAL CALCIUM) 500 MG chewable tablet Chew 1 tablet by mouth daily as needed for indigestion or heartburn.   Yes [provider]  calcium gluconate 500 MG tablet Take 1,000 mg  by mouth daily.   Yes [provider]  Cholecalciferol (VITAMIN D) 2000 units CAPS Take 2,000 Units by mouth at bedtime.   Yes [provider]  Coenzyme Q10 (CO Q 10 PO) Take 1 tablet by mouth daily.   Yes [provider]  Flaxseed, Linseed, (FLAX SEED OIL PO) Take 1 capsule by mouth daily.   Yes [provider]  ibuprofen (ADVIL,MOTRIN) 200 MG tablet Take 600 mg by mouth every 6 (six) hours as needed for moderate pain.   Yes [provider]  losartan (COZAAR) 100 MG tablet Take 100 mg by mouth daily.   Yes [provider]  magnesium oxide (MAG-OX) 400 MG tablet Take 400 mg by mouth daily.   Yes [provider]  Methylcellulose, Laxative, (CITRUCEL PO) Take 1 tablet by mouth 2 (two) times daily.   Yes [provider]  Multiple Vitamin  (MULTIVITAMIN WITH MINERALS) TABS tablet Take 1 tablet by mouth daily.   Yes [provider]  spironolactone (ALDACTONE) 25 MG tablet Take 25 mg by mouth daily.   Yes [provider]     Positive ROS: Otherwise negative  All other systems have been reviewed and were otherwise negative with the exception of those mentioned in the HPI and as above.  Physical Exam: Constitutional: Alert, well-appearing, no acute distress Ears: External ears without lesions or tenderness.  The left ear canal is completely occluded with cerumen.  The right ear canal is partially occluded with cerumen.  Both ear canals were cleaned with hydroperoxide and suction and TMs were clear bilaterally with good mobility on pneumatic otoscopy and improvement of her hearing. Nasal: External nose without lesions. Septum with mild deformity.  She does have a prominent septal vessel anteriorly on the right side.  Both middle meatus regions were clear with no signs of infection. Clear nasal passages otherwise. Oral: Lips and gums without lesions. Tongue and palate mucosa without lesions. Posterior oropharynx clear. Neck: No palpable adenopathy or masses.  She does have some slight TMJ abnormalities but no real pain today. Respiratory: Breathing comfortably  Skin: No facial/neck lesions or rash noted.  Cerumen impaction removal  Date/Time: 04/08/2020 6:50 PM Performed by: Drema Halon, MD Authorized by: Drema Halon, MD   Consent:    Consent obtained:  Verbal   Consent given by:  Patient   Risks discussed:  Pain and bleeding Procedure details:    Location:  L ear and R ear   Procedure type: suction   Post-procedure details:    Inspection:  TM intact and canal normal   Hearing quality:  Improved   Patient tolerance of procedure:  Tolerated well, no immediate complications Comments:     The left ear canal was completely occluded with cerumen that was cleaned with suction and  hydroperoxide.  The right TM was partially occluded with cerumen that was cleaned with suction and hydroperoxide.    Assessment: Ear problems were related cerumen impaction bilaterally which is worse on the left side.  This was cleaned in the office today with improvement of her hearing.  She wears bilateral hearing aids.  Plan: She will follow-up as needed.  Narda Bonds, MD

## 2021-07-18 ENCOUNTER — Encounter (HOSPITAL_COMMUNITY): Payer: Self-pay

## 2021-07-18 ENCOUNTER — Inpatient Hospital Stay (HOSPITAL_COMMUNITY)
Admission: EM | Admit: 2021-07-18 | Discharge: 2021-07-28 | DRG: 982 | Disposition: A | Discharge status: Home / Self Care | Payer: Medicare Other | Attending: Internal Medicine | Admitting: Internal Medicine

## 2021-07-18 ENCOUNTER — Emergency Department (HOSPITAL_COMMUNITY): Payer: Medicare Other

## 2021-07-18 DIAGNOSIS — I10 Essential (primary) hypertension: Secondary | ICD-10-CM | POA: Diagnosis present

## 2021-07-18 DIAGNOSIS — E118 Type 2 diabetes mellitus with unspecified complications: Secondary | ICD-10-CM | POA: Diagnosis present

## 2021-07-18 DIAGNOSIS — Z888 Allergy status to other drugs, medicaments and biological substances status: Secondary | ICD-10-CM

## 2021-07-18 DIAGNOSIS — K921 Melena: Secondary | ICD-10-CM

## 2021-07-18 DIAGNOSIS — Z9049 Acquired absence of other specified parts of digestive tract: Secondary | ICD-10-CM

## 2021-07-18 DIAGNOSIS — Z79899 Other long term (current) drug therapy: Secondary | ICD-10-CM

## 2021-07-18 DIAGNOSIS — Z6834 Body mass index (BMI) 34.0-34.9, adult: Secondary | ICD-10-CM

## 2021-07-18 DIAGNOSIS — Q279 Congenital malformation of peripheral vascular system, unspecified: Secondary | ICD-10-CM

## 2021-07-18 DIAGNOSIS — Z7982 Long term (current) use of aspirin: Secondary | ICD-10-CM

## 2021-07-18 DIAGNOSIS — Z87891 Personal history of nicotine dependence: Secondary | ICD-10-CM

## 2021-07-18 DIAGNOSIS — B961 Klebsiella pneumoniae [K. pneumoniae] as the cause of diseases classified elsewhere: Secondary | ICD-10-CM | POA: Diagnosis present

## 2021-07-18 DIAGNOSIS — E7800 Pure hypercholesterolemia, unspecified: Secondary | ICD-10-CM | POA: Diagnosis present

## 2021-07-18 DIAGNOSIS — R062 Wheezing: Secondary | ICD-10-CM

## 2021-07-18 DIAGNOSIS — N390 Urinary tract infection, site not specified: Secondary | ICD-10-CM | POA: Clinically undetermined

## 2021-07-18 DIAGNOSIS — K648 Other hemorrhoids: Secondary | ICD-10-CM | POA: Diagnosis present

## 2021-07-18 DIAGNOSIS — E876 Hypokalemia: Secondary | ICD-10-CM | POA: Diagnosis present

## 2021-07-18 DIAGNOSIS — E119 Type 2 diabetes mellitus without complications: Secondary | ICD-10-CM | POA: Diagnosis present

## 2021-07-18 DIAGNOSIS — K644 Residual hemorrhoidal skin tags: Secondary | ICD-10-CM | POA: Diagnosis present

## 2021-07-18 DIAGNOSIS — Z20822 Contact with and (suspected) exposure to covid-19: Secondary | ICD-10-CM | POA: Diagnosis present

## 2021-07-18 DIAGNOSIS — R509 Fever, unspecified: Secondary | ICD-10-CM

## 2021-07-18 DIAGNOSIS — K5521 Angiodysplasia of colon with hemorrhage: Secondary | ICD-10-CM | POA: Diagnosis not present

## 2021-07-18 DIAGNOSIS — K922 Gastrointestinal hemorrhage, unspecified: Secondary | ICD-10-CM | POA: Diagnosis not present

## 2021-07-18 DIAGNOSIS — K5731 Diverticulosis of large intestine without perforation or abscess with bleeding: Secondary | ICD-10-CM | POA: Diagnosis present

## 2021-07-18 DIAGNOSIS — E669 Obesity, unspecified: Secondary | ICD-10-CM | POA: Diagnosis present

## 2021-07-18 DIAGNOSIS — D62 Acute posthemorrhagic anemia: Secondary | ICD-10-CM | POA: Diagnosis present

## 2021-07-18 LAB — CBC
HCT: 39.4 % (ref 36.0–46.0)
Hemoglobin: 12.7 g/dL (ref 12.0–15.0)
MCH: 31.8 pg (ref 26.0–34.0)
MCHC: 32.2 g/dL (ref 30.0–36.0)
MCV: 98.7 fL (ref 80.0–100.0)
Platelets: 295 10*3/uL (ref 150–400)
RBC: 3.99 MIL/uL (ref 3.87–5.11)
RDW: 13.5 % (ref 11.5–15.5)
WBC: 8.6 10*3/uL (ref 4.0–10.5)
nRBC: 0 % (ref 0.0–0.2)

## 2021-07-18 LAB — COMPREHENSIVE METABOLIC PANEL
ALT: 24 U/L (ref 0–44)
AST: 23 U/L (ref 15–41)
Albumin: 4.2 g/dL (ref 3.5–5.0)
Alkaline Phosphatase: 81 U/L (ref 38–126)
Anion gap: 8 (ref 5–15)
BUN: 23 mg/dL (ref 8–23)
CO2: 25 mmol/L (ref 22–32)
Calcium: 9.7 mg/dL (ref 8.9–10.3)
Chloride: 105 mmol/L (ref 98–111)
Creatinine, Ser: 0.79 mg/dL (ref 0.44–1.00)
GFR, Estimated: >60 mL/min (ref >60)
Glucose, Bld: 199 mg/dL — ABNORMAL HIGH (ref 70–99)
Potassium: 4.4 mmol/L (ref 3.5–5.1)
Sodium: 138 mmol/L (ref 135–145)
Total Bilirubin: 0.5 mg/dL (ref 0.3–1.2)
Total Protein: 8 g/dL (ref 6.5–8.1)

## 2021-07-18 LAB — RESP PANEL BY RT-PCR (FLU A&B, COVID) ARPGX2
Influenza A by PCR: NEGATIVE
Influenza B by PCR: NEGATIVE
SARS Coronavirus 2 by RT PCR: NEGATIVE

## 2021-07-18 LAB — CBG MONITORING, ED: Glucose-Capillary: 117 mg/dL — ABNORMAL HIGH (ref 70–99)

## 2021-07-18 MED ORDER — ACETAMINOPHEN 650 MG RE SUPP: 650.0000 mg | Freq: Four times a day (QID) | RECTAL | Status: DC | PRN

## 2021-07-18 MED ORDER — SODIUM CHLORIDE 0.9 % IV BOLUS
500.0000 mL | Freq: Once | INTRAVENOUS | Status: AC
  Administered 2021-07-18: 500 mL via INTRAVENOUS

## 2021-07-18 MED ORDER — INSULIN ASPART 100 UNIT/ML IJ SOLN
0.0000 [IU] | INTRAMUSCULAR | Status: DC
  Administered 2021-07-19: 1 [IU] via SUBCUTANEOUS
  Administered 2021-07-19: 2 [IU] via SUBCUTANEOUS
  Administered 2021-07-19: 1 [IU] via SUBCUTANEOUS
  Administered 2021-07-19 – 2021-07-20 (×2): 2 [IU] via SUBCUTANEOUS
  Administered 2021-07-20: 1 [IU] via SUBCUTANEOUS
  Administered 2021-07-20 – 2021-07-21 (×3): 2 [IU] via SUBCUTANEOUS
  Administered 2021-07-21 (×2): 1 [IU] via SUBCUTANEOUS
  Administered 2021-07-21 (×2): 2 [IU] via SUBCUTANEOUS
  Administered 2021-07-22 (×2): 1 [IU] via SUBCUTANEOUS
  Administered 2021-07-22 (×2): 2 [IU] via SUBCUTANEOUS
  Administered 2021-07-23: 1 [IU] via SUBCUTANEOUS
  Administered 2021-07-23: 2 [IU] via SUBCUTANEOUS
  Administered 2021-07-24 (×3): 1 [IU] via SUBCUTANEOUS
  Administered 2021-07-24 – 2021-07-25 (×2): 2 [IU] via SUBCUTANEOUS
  Administered 2021-07-25: 1 [IU] via SUBCUTANEOUS
  Administered 2021-07-25: 2 [IU] via SUBCUTANEOUS
  Administered 2021-07-25 – 2021-07-26 (×3): 1 [IU] via SUBCUTANEOUS
  Administered 2021-07-26: 17:00:00 2 [IU] via SUBCUTANEOUS
  Administered 2021-07-26 (×2): 1 [IU] via SUBCUTANEOUS
  Administered 2021-07-26: 22:00:00 2 [IU] via SUBCUTANEOUS
  Administered 2021-07-27: 12:00:00 3 [IU] via SUBCUTANEOUS
  Administered 2021-07-27 (×3): 2 [IU] via SUBCUTANEOUS
  Administered 2021-07-27 – 2021-07-28 (×3): 1 [IU] via SUBCUTANEOUS
  Administered 2021-07-28: 2 [IU] via SUBCUTANEOUS
  Administered 2021-07-28: 1 [IU] via SUBCUTANEOUS
  Filled 2021-07-18: qty 0.09

## 2021-07-18 MED ORDER — HYDRALAZINE HCL 20 MG/ML IJ SOLN: 10.0000 mg | INTRAMUSCULAR | Status: DC | PRN

## 2021-07-18 MED ORDER — SODIUM CHLORIDE 0.9 % IV SOLN
INTRAVENOUS | Status: AC
  Administered 2021-07-18: 1000 mL via INTRAVENOUS

## 2021-07-18 MED ORDER — IOHEXOL 350 MG/ML SOLN
80.0000 mL | Freq: Once | INTRAVENOUS | Status: AC | PRN
  Administered 2021-07-18: 80 mL via INTRAVENOUS

## 2021-07-18 MED ORDER — ACETAMINOPHEN 325 MG PO TABS
650.0000 mg | ORAL_TABLET | Freq: Four times a day (QID) | ORAL | Status: DC | PRN
  Administered 2021-07-20 – 2021-07-25 (×3): 650 mg via ORAL
  Filled 2021-07-18 (×2): qty 2

## 2021-07-18 NOTE — H&P (Signed)
History and Physical    Melissa [name]:[id number] DOB: [birth year] DOA: 07/18/2021  PCP: Marden Noble, MD  Patient coming from: Home.  Chief Complaint: Bleeding per rectum.  HPI: Melissa [name] is a 75 y.o. female with history of prior GI bleed in 2017 and subsequent colonoscopy in 2018 was unremarkable with history of diabetes mellitus hypertension and hyperlipidemia presents to the ER with complaints of having multiple episodes of bleeding per rectum bright blood per rectum.  Denies any abdominal pain vomiting.  Patient symptoms started this morning.  Patient does take baby aspirin.  ED Course: While in the ER patient had another 2 episodes of bright red blood per rectum.  Hemodynamically stable.  CT angiogram of the abdomen did not show any active bleed.  Did show diverticulosis.  Labs show hemoglobin of 12.7 stool for occult blood was negative per ER physician discussed with on-call gastroenterologist Dr. Marca Ancona will be seeing patient in consult.  Review of Systems: As per HPI, rest all negative.   Past Medical History:  Diagnosis Date   Diabetes mellitus without complication (HCC)    Diverticulosis    Hypercholesteremia    Hypertension    Kidney stones     Past Surgical History:  Procedure Laterality Date   CESAREAN SECTION     COLON SURGERY     COLONOSCOPY WITH PROPOFOL N/A 10/12/2016   Procedure: COLONOSCOPY WITH PROPOFOL;  Surgeon: Charolett Bumpers, MD;  Location: [facility];  Service: Endoscopy;  Laterality: N/A;     reports that she has quit smoking. She has a 1.00 pack-year smoking history. She has never used smokeless tobacco. She reports current alcohol use. She reports that she does not use drugs.  Allergies  Allergen Reactions   Ciprofloxacin Other (See Comments)    Tendonitis and joint pain   Amlodipine Besylate Other (See Comments)    Joint pain   Atorvastatin Other (See Comments)    "Joint pains / ?gout"   Metformin Hcl Er Diarrhea    Spironolactone Other (See Comments)    "Hair changes"    History reviewed. No pertinent family history.  Prior to Admission medications   Medication Sig Start Date End Date Taking? Authorizing Provider  acetaminophen (TYLENOL) 500 MG tablet Take 500-1,000 mg by mouth every 6 (six) hours as needed for mild pain or headache.   Yes [provider]  Alpha-Lipoic Acid 600 MG CAPS Take 600 mg by mouth in the morning.   Yes [provider]  aspirin EC 81 MG tablet Take 81 mg by mouth daily with supper.   Yes [provider]  B Complex Vitamins (VITAMIN B COMPLEX) TABS Take 1 tablet by mouth in the morning.   Yes [provider]  Biotin 2500 MCG CAPS Take 2,500 mcg by mouth daily at 2 PM.   Yes [provider]  Calcium Carbonate (CALCIUM 500 PO) Take 1,000 mg by mouth every evening.   Yes [provider]  calcium carbonate (TUMS - DOSED IN MG ELEMENTAL CALCIUM) 500 MG chewable tablet Chew 1-2 tablets by mouth at bedtime as needed for indigestion or heartburn.   Yes [provider]  Cholecalciferol (VITAMIN D3) 50 MCG (2000 UT) TABS Take 2,000 Units by mouth daily at 2 PM.   Yes [provider]  Coenzyme Q10 (CO Q10) 100 MG CAPS Take 100 mg by mouth every evening.   Yes [provider]  FIBER PO Take 1 capsule by mouth in the morning and at  bedtime.   Yes [provider]  Flaxseed, Linseed, (FLAX SEED OIL) 1000 MG CAPS Take 1,000 mg by mouth every evening.   Yes [provider]  glimepiride (AMARYL) 2 MG tablet Take 2-3 mg by mouth See admin instructions. Take 3 mg by mouth in the morning with breakfast and 2 mg with supper   Yes [provider]  ibuprofen (ADVIL,MOTRIN) 200 MG tablet Take 200-400 mg by mouth every 6 (six) hours as needed for mild pain, moderate pain or headache.   Yes [provider]  losartan (COZAAR) 100 MG tablet Take 100 mg by mouth every evening.   Yes [provider]  Multiple Vitamin (MULTIVITAMIN WITH MINERALS) TABS tablet Take 1 tablet by mouth daily with breakfast.   Yes [provider]  Omega-3 Fatty Acids (FISH OIL PO) Take 1 capsule by mouth every evening.   Yes [provider]  omeprazole (PRILOSEC OTC) 20 MG tablet Take 20 mg by mouth See admin instructions. Take 20 mg by mouth in the morning as needed for heartburn   Yes [provider]  polyethylene glycol powder (GLYCOLAX/MIRALAX) 17 GM/SCOOP powder Take 17 g by mouth See admin instructions. Mix 17 grams of powder into 4-8 ounces of water and drink in the morning as needed for constipation   Yes [provider]  rosuvastatin (CRESTOR) 5 MG tablet Take 5 mg by mouth at bedtime.   Yes [provider]  TURMERIC PO Take 1 capsule by mouth in the morning.   Yes [provider]  vitamin C (ASCORBIC ACID) 500 MG tablet Take 500 mg by mouth in the morning and at bedtime.   Yes [provider]  magnesium oxide (MAG-OX) 400 MG tablet Take 400 mg by mouth daily. Patient not taking: Reported on 07/18/2021    [provider]  spironolactone (ALDACTONE) 25 MG tablet Take 25 mg by mouth daily. Patient not taking: Reported on 07/18/2021    [provider]    Physical Exam: Constitutional: Moderately built and nourished. Vitals:   07/18/21 1830 07/18/21 1900 07/18/21 1930 07/18/21 2000  BP: (!) 170/76 (!) 144/65 (!) 151/67 (!) 149/66  Pulse: 83 84 85 82  Resp: 18 18 18 18   Temp:      TempSrc:      SpO2: 97% 96% 95% 97%  Weight:      Height:       Eyes: Anicteric no pallor. ENMT: No discharge from the ears eyes nose and mouth. Neck: No mass felt.  No neck rigidity. Respiratory: No rhonchi or crepitations. Cardiovascular: S1-S2 heard. Abdomen: Soft nontender bowel sound present. Musculoskeletal: No edema. Skin: No rash. Neurologic: Alert awake oriented to time place and person.  Moves all  extremities. Psychiatric: Appears normal.  Normal affect.   Labs on Admission: I have personally reviewed following labs and imaging studies  CBC: Recent Labs  Lab 07/18/21 1126  WBC 8.6  HGB 12.7  HCT 39.4  MCV 98.7  PLT [id number]   Basic Metabolic Panel: Recent Labs  Lab 07/18/21 1126  NA 138  K 4.4  CL 105  CO2 25  GLUCOSE 199*  BUN 23  CREATININE 0.79  CALCIUM 9.7   GFR: Estimated Creatinine Clearance: 61.6 mL/min (by C-G formula based on SCr of 0.79 mg/dL). Liver Function Tests: Recent Labs  Lab 07/18/21 1126  AST 23  ALT 24  ALKPHOS 81  BILITOT 0.5  PROT 8.0  ALBUMIN 4.2   No results for input(s): LIPASE,  AMYLASE in the last 168 hours. No results for input(s): AMMONIA in the last 168 hours. Coagulation Profile: No results for input(s): INR, PROTIME in the last 168 hours. Cardiac Enzymes: No results for input(s): CKTOTAL, CKMB, CKMBINDEX, TROPONINI in the last 168 hours. BNP (last 3 results) No results for input(s): PROBNP in the last 8760 hours. HbA1C: No results for input(s): HGBA1C in the last 72 hours. CBG: No results for input(s): GLUCAP in the last 168 hours. Lipid Profile: No results for input(s): CHOL, HDL, LDLCALC, TRIG, CHOLHDL, LDLDIRECT in the last 72 hours. Thyroid Function Tests: No results for input(s): TSH, T4TOTAL, FREET4, T3FREE, THYROIDAB in the last 72 hours. Anemia Panel: No results for input(s): VITAMINB12, FOLATE, FERRITIN, TIBC, IRON, RETICCTPCT in the last 72 hours. Urine analysis:    Component Value Date/Time   COLORURINE YELLOW 11/10/2015 0500   APPEARANCEUR CLEAR 11/10/2015 0500   LABSPEC 1.019 11/10/2015 0500   PHURINE 5.5 11/10/2015 0500   GLUCOSEU NEGATIVE 11/10/2015 0500   GLUCOSEU NEGATIVE 06/10/2008 0923   HGBUR NEGATIVE 11/10/2015 0500   BILIRUBINUR NEGATIVE 11/10/2015 0500   KETONESUR NEGATIVE 11/10/2015 0500   PROTEINUR NEGATIVE 11/10/2015 0500   UROBILINOGEN 0.2 02/20/2009 1210   NITRITE NEGATIVE 11/10/2015  0500   LEUKOCYTESUR NEGATIVE 11/10/2015 0500   Sepsis Labs: @LABRCNTIP (procalcitonin:4,lacticidven:4) ) Recent Results (from the past 240 hour(s))  Resp Panel by RT-PCR (Flu A&B, Covid) Nasopharyngeal Swab     Status: None   Collection Time: 07/18/21  6:30 PM   Specimen: Nasopharyngeal Swab; Nasopharyngeal(NP) swabs in vial transport medium  Result Value Ref Range Status   SARS Coronavirus 2 by RT PCR NEGATIVE NEGATIVE Final    Comment: (NOTE) SARS-CoV-2 target nucleic acids are NOT DETECTED.  The SARS-CoV-2 RNA is generally detectable in upper respiratory specimens during the acute phase of infection. The lowest concentration of SARS-CoV-2 viral copies this assay can detect is 138 copies/mL. A negative result does not preclude SARS-Cov-2 infection and should not be used as the sole basis for treatment or other patient management decisions. A negative result may occur with  improper specimen collection/handling, submission of specimen other than nasopharyngeal swab, presence of viral mutation(s) within the areas targeted by this assay, and inadequate number of viral copies(<138 copies/mL). A negative result must be combined with clinical observations, patient history, and epidemiological information. The expected result is Negative.  Fact Sheet for Patients:  EntrepreneurPulse.com.au  Fact Sheet for Healthcare Providers:  IncredibleEmployment.be  This test is no t yet approved or cleared by the [place] FDA and  has been authorized for detection and/or diagnosis of SARS-CoV-2 by FDA under an Emergency Use Authorization (EUA). This EUA will remain  in effect (meaning this test can be used) for the duration of the COVID-19 declaration under Section 564(b)(1) of the Act, 21 U.S.C.section 360bbb-3(b)(1), unless the authorization is terminated  or revoked sooner.       Influenza A by PCR NEGATIVE NEGATIVE Final   Influenza B by PCR  NEGATIVE NEGATIVE Final    Comment: (NOTE) The Xpert Xpress SARS-CoV-2/FLU/RSV plus assay is intended as an aid in the diagnosis of influenza from Nasopharyngeal swab specimens and should not be used as a sole basis for treatment. Nasal washings and aspirates are unacceptable for Xpert Xpress SARS-CoV-2/FLU/RSV testing.  Fact Sheet for Patients: EntrepreneurPulse.com.au  Fact Sheet for Healthcare Providers: IncredibleEmployment.be  This test is not yet approved or cleared by the [place] FDA and has been authorized for detection and/or diagnosis of SARS-CoV-2 by FDA  under an Emergency Use Authorization (EUA). This EUA will remain in effect (meaning this test can be used) for the duration of the COVID-19 declaration under Section 564(b)(1) of the Act, 21 U.S.C. section 360bbb-3(b)(1), unless the authorization is terminated or revoked.  Performed at [facility], [place] [address], [place], [place] [address]      Radiological Exams on Admission: CT ANGIO GI BLEED  Result Date: 07/18/2021 CLINICAL DATA:  Constipation, blood in stool EXAM: CTA ABDOMEN AND PELVIS WITHOUT AND WITH CONTRAST TECHNIQUE: Multidetector CT imaging of the abdomen and pelvis was performed using the standard protocol during bolus administration of intravenous contrast. Multiplanar reconstructed images and MIPs were obtained and reviewed to evaluate the vascular anatomy. CONTRAST:  68mL OMNIPAQUE IOHEXOL 350 MG/ML SOLN COMPARISON:  None. FINDINGS: VASCULAR Aorta: Normal caliber aorta without aneurysm, dissection, vasculitis or significant stenosis. Minimal atherosclerosis. Celiac: Patent without evidence of aneurysm, dissection, vasculitis or significant stenosis. SMA: Patent without evidence of aneurysm, dissection, vasculitis or significant stenosis. Renals: There are 3 left renal arteries. The 2 superior left renal arteries are widely patent without  atherosclerosis, aneurysm, or vasculitis. The most inferior left renal artery demonstrates mild atherosclerosis at its origin, with less than 50% stenosis. 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 The main renal artery is unremarkable. Accessory right renal artery supplying portions of the lower pole right kidney are unremarkable without stenosis, aneurysm, or vasculitis. IMA: Patent without evidence of aneurysm, dissection, vasculitis or significant stenosis. Inflow: Patent without evidence of aneurysm, dissection, vasculitis or significant stenosis. Proximal Outflow: Bilateral common femoral and visualized portions of the superficial and profunda femoral arteries are patent without evidence of aneurysm, dissection, vasculitis or significant stenosis. Veins: No obvious venous abnormality within the limitations of this arterial phase study. Review of the MIP images confirms the above findings. NON-VASCULAR Lower chest: No acute pleural or parenchymal lung disease. Hepatobiliary: Mild diffuse hepatic steatosis. No focal liver abnormality. The gallbladder is unremarkable. Pancreas: Unremarkable. No pancreatic ductal dilatation or surrounding inflammatory changes. Spleen: Normal in size without focal abnormality. Adrenals/Urinary Tract: Adrenal glands are unremarkable. Kidneys are normal, without renal calculi, focal lesion, or hydronephrosis. Bladder is minimally distended. Streak artifact from right hip arthroplasty limits evaluation of the bladder. Stomach/Bowel: No bowel obstruction or ileus. Postsurgical changes are seen from sigmoid colon resection and reanastomosis. There is diffuse colonic diverticulosis without evidence of acute diverticulitis. There is no evidence of intraluminal contrast accumulation to suggest active gastrointestinal hemorrhage. Lymphatic: No pathologic adenopathy within the abdomen or pelvis. Reproductive: Uterus and bilateral adnexa are unremarkable. Other: No free fluid or free gas.  There is a Forensic psychologist type umbilical hernia containing the anti mesenteric aspect of the mid transverse colon. No evidence of bowel incarceration or obstruction. Musculoskeletal: No acute or destructive bony lesions. Right hip arthroplasty is unremarkable. Reconstructed images demonstrate no additional findings. IMPRESSION: VASCULAR 1. No evidence of active gastrointestinal hemorrhage. 2.  Aortic Atherosclerosis (ICD10-I70.0). NON-VASCULAR 1. Diffuse colonic diverticulosis without diverticulitis. 2. No bowel obstruction or ileus. 3. 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 No incarceration or obstruction. 4. Hepatic steatosis. Electronically Signed   By: Randa Ngo M.D.   On: 07/18/2021 17:12      Assessment/Plan Principal Problem:   Acute GI bleeding Active Problems:   Essential hypertension   Diabetes mellitus with complication (HCC)    Acute GI bleed likely diverticular origin given the CT scan that showed diverticulosis and also has been having bright blood per rectum.  We will  hold off patient's aspirin.  Gastroenterology Dr. Therisa Doyne has been consulted.  We will keep patient n.p.o. check serial CBCs type and screen transfuse if hemoglobin less than 7 or if patient has active bleed or hypotensive. History of hypertension we will keep patient on as needed IV hydralazine for now since patient is n.p.o. Diabetes mellitus type 2 we will keep patient on sliding scale while NPO. History of hyperlipidemia.   DVT prophylaxis: SCDs.  Avoiding anticoagulation in the setting of GI bleed. Code Status: Full code. Family Communication: Patient husband at the bedside. Disposition Plan: Home when stable. Consults called: [facility] gastro[facility]ologist. Admission status: Observation.   Rise Patience MD [facility] Pager [phone].  If 7PM-7AM, please contact night-coverage [email or web address] Password [facility]  07/18/2021, 8:58 PM

## 2021-07-18 NOTE — ED Notes (Signed)
Family updated as to patient's status.

## 2021-07-18 NOTE — ED Notes (Signed)
Patient transported to CT 

## 2021-07-18 NOTE — ED Notes (Signed)
Pt NAD in bed, a/ox4. Pt c/o moderate bright red bleeding x last noc with approximately 5 episodes since. Pt denies ABD pain, n/v/d. Pt states she takes a baby ASA daily but otherwise denies blood thinners. Pt also denies dizziness, CP, SOB, weakness.

## 2021-07-18 NOTE — ED Triage Notes (Signed)
Patient reports rectal bleeding yesterday and rectal pressure. Says she got covid vaccine yesterday. Was small amount of blood in stool, patient says today there is copious amounts of blood - bright red.

## 2021-07-18 NOTE — ED Provider Notes (Signed)
[facility] DEPT Provider Note   CSN: [id number] Arrival date & time: 07/18/21  1038     History Chief Complaint  Patient presents with   Rectal Bleeding    LAVANDA TULLOCH is a 75 y.o. female.  HPI Patient with history of prior ruptured diverticulitis with previous colostomy now status post anastomosis presents with new rectal bleeding.  She also has a history of diverticulosis.  Last colonoscopy was 4 years ago.  Over the past 2 days she has had increasingly frequent increasingly substantial amounts of blood per rectum.  No syncope, no chest pain, no dyspnea.  No current abdominal pain.  No relief with anything.  She notes that about 2 weeks ago after she began feeling constipated she began taking MiraLAX.  However, in spite of taking this that she has had decreasing frequency and caliber of stools.    Past Medical History:  Diagnosis Date   Diabetes mellitus without complication ([place])    Diverticulosis    Hypercholesteremia    Hypertension    Kidney stones     Patient Active Problem List   Diagnosis Date Noted   GI bleed 11/10/2015   BRBPR (bright red blood per rectum) 11/10/2015   Obesity 11/10/2015   Flank pain 11/10/2015   Lower GI bleed    Diabetes mellitus with complication ([place])    FATIGUE 12/10/2008   Diabetes ([place]) 06/13/2008   HYPERLIPIDEMIA 06/13/2008   GOUT 06/13/2008   Anxiety state 06/13/2008   DEPRESSION 06/13/2008   Essential hypertension 06/13/2008   GERD 06/13/2008   PEPTIC ULCER DISEASE 06/13/2008   OSTEOPOROSIS 06/13/2008   DIVERTICULITIS, HX OF 06/13/2008   NEPHROLITHIASIS, HX OF 06/13/2008    Past Surgical History:  Procedure Laterality Date   CESAREAN SECTION     COLON SURGERY     COLONOSCOPY WITH PROPOFOL N/A 10/12/2016   Procedure: COLONOSCOPY WITH PROPOFOL;  Surgeon: Garlan Fair, MD;  Location: [facility] END[facility];  Service: Endoscopy;  Laterality: N/A;     OB History   No obstetric history on file.      No family history on file.  Social History   Tobacco Use   Smoking status: Former    Packs/day: 0.50    Years: 2.00    Pack years: 1.00    Types: Cigarettes   Smokeless tobacco: Never  Substance Use Topics   Alcohol use: Yes    Comment: occasional   Drug use: No    Home Medications Prior to Admission medications   Medication Sig Start Date End Date Taking? Authorizing Provider  aspirin EC 81 MG tablet Take 81 mg by mouth every other day.    [provider]  atorvastatin (LIPITOR) 10 MG tablet Take 10 mg by mouth every other day.    [provider]  calcium carbonate (TUMS - DOSED IN MG ELEMENTAL CALCIUM) 500 MG chewable tablet Chew 1 tablet by mouth daily as needed for indigestion or heartburn.    [provider]  calcium gluconate 500 MG tablet Take 1,000 mg by mouth daily.    [provider]  Cholecalciferol (VITAMIN D) 2000 units CAPS Take 2,000 Units by mouth at bedtime.    [provider]  Coenzyme Q10 (CO Q 10 PO) Take 1 tablet by mouth daily.    [provider]  Flaxseed, Linseed, (FLAX SEED OIL PO) Take 1 capsule by mouth daily.    [provider]  ibuprofen (ADVIL,MOTRIN) 200 MG tablet Take 600 mg by mouth  every 6 (six) hours as needed for moderate pain.    [provider]  losartan (COZAAR) 100 MG tablet Take 100 mg by mouth daily.    [provider]  magnesium oxide (MAG-OX) 400 MG tablet Take 400 mg by mouth daily.    [provider]  Methylcellulose, Laxative, (CITRUCEL PO) Take 1 tablet by mouth 2 (two) times daily.    [provider]  Multiple Vitamin (MULTIVITAMIN WITH MINERALS) TABS tablet Take 1 tablet by mouth daily.    [provider]  spironolactone (ALDACTONE) 25 MG tablet Take 25 mg by mouth daily.    [provider]    Allergies    Ciprofloxin hcl [ciprofloxacin]  Review of Systems   Review of Systems  Constitutional:        Per  HPI, otherwise negative  HENT:         Per HPI, otherwise negative  Respiratory:         Per HPI, otherwise negative  Cardiovascular:        Per HPI, otherwise negative  Gastrointestinal:  Positive for anal bleeding and diarrhea. Negative for vomiting.  Endocrine:       Negative aside from HPI  Genitourinary:        Neg aside from HPI   Musculoskeletal:        Per HPI, otherwise negative  Skin: Negative.   Neurological:  Negative for syncope.   Physical Exam Updated Vital Signs BP (!) 183/81 (BP Location: Left Arm)   Pulse 85   Temp 99.1 F (37.3 C) (Oral)   Resp 16   Ht 5\' 3"  (1.6 m)   Wt 87.1 kg   SpO2 95%   BMI 34.01 kg/m   Physical Exam Vitals and nursing note reviewed.  Constitutional:      General: She is not in acute distress.    Appearance: She is well-developed.  HENT:     Head: Normocephalic and atraumatic.  Eyes:     Conjunctiva/sclera: Conjunctivae normal.  Cardiovascular:     Rate and Rhythm: Normal rate and regular rhythm.  Pulmonary:     Effort: Pulmonary effort is normal. No respiratory distress.     Breath sounds: Normal breath sounds. No stridor.  Abdominal:     General: There is no distension.     Tenderness: There is no abdominal tenderness. There is no guarding.  Skin:    General: Skin is warm and dry.  Neurological:     Mental Status: She is alert and oriented to person, place, and time.     Cranial Nerves: No cranial nerve deficit.    ED Results / Procedures / Treatments   Labs (all labs ordered are listed, but only abnormal results are displayed) Labs Reviewed  COMPREHENSIVE METABOLIC PANEL - Abnormal; Notable for the following components:      Result Value   Glucose, Bld 199 (*)    All other components within normal limits  RESP PANEL BY RT-PCR (FLU A&B, COVID) ARPGX2  CBC  POC OCCULT BLOOD, ED  TYPE AND SCREEN    EKG None  Radiology CT ANGIO GI BLEED  Result Date: 07/18/2021 CLINICAL DATA:  Constipation, blood in  stool EXAM: CTA ABDOMEN AND PELVIS WITHOUT AND WITH CONTRAST TECHNIQUE: Multidetector CT imaging of the abdomen and pelvis was performed using the standard protocol during bolus administration of intravenous contrast. Multiplanar reconstructed images and MIPs were obtained and reviewed to evaluate the vascular anatomy. CONTRAST:  7mL OMNIPAQUE IOHEXOL 350 MG/ML  SOLN COMPARISON:  None. FINDINGS: VASCULAR Aorta: Normal caliber aorta without aneurysm, dissection, vasculitis or significant stenosis. Minimal atherosclerosis. Celiac: Patent without evidence of aneurysm, dissection, vasculitis or significant stenosis. SMA: Patent without evidence of aneurysm, dissection, vasculitis or significant stenosis. Renals: There are 3 left renal arteries. The 2 superior left renal arteries are widely patent without atherosclerosis, aneurysm, or vasculitis. The most inferior left renal artery demonstrates mild atherosclerosis at its origin, with less than 50% stenosis. 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 The main renal artery is unremarkable. Accessory right renal artery supplying portions of the lower pole right kidney are unremarkable without stenosis, aneurysm, or vasculitis. IMA: Patent without evidence of aneurysm, dissection, vasculitis or significant stenosis. Inflow: Patent without evidence of aneurysm, dissection, vasculitis or significant stenosis. Proximal Outflow: Bilateral common femoral and visualized portions of the superficial and profunda femoral arteries are patent without evidence of aneurysm, dissection, vasculitis or significant stenosis. Veins: No obvious venous abnormality within the limitations of this arterial phase study. Review of the MIP images confirms the above findings. NON-VASCULAR Lower chest: No acute pleural or parenchymal lung disease. Hepatobiliary: Mild diffuse hepatic steatosis. No focal liver abnormality. The gallbladder is unremarkable. Pancreas: Unremarkable. No pancreatic ductal  dilatation or surrounding inflammatory changes. Spleen: Normal in size without focal abnormality. Adrenals/Urinary Tract: Adrenal glands are unremarkable. Kidneys are normal, without renal calculi, focal lesion, or hydronephrosis. Bladder is minimally distended. Streak artifact from right hip arthroplasty limits evaluation of the bladder. Stomach/Bowel: No bowel obstruction or ileus. Postsurgical changes are seen from sigmoid colon resection and reanastomosis. There is diffuse colonic diverticulosis without evidence of acute diverticulitis. There is no evidence of intraluminal contrast accumulation to suggest active gastrointestinal hemorrhage. Lymphatic: No pathologic adenopathy within the abdomen or pelvis. Reproductive: Uterus and bilateral adnexa are unremarkable. Other: No free fluid or free gas. There is a Forensic psychologist type umbilical hernia containing the anti mesenteric aspect of the mid transverse colon. No evidence of bowel incarceration or obstruction. Musculoskeletal: No acute or destructive bony lesions. Right hip arthroplasty is unremarkable. Reconstructed images demonstrate no additional findings. IMPRESSION: VASCULAR 1. No evidence of active gastrointestinal hemorrhage. 2.  Aortic Atherosclerosis (ICD10-I70.0). NON-VASCULAR 1. Diffuse colonic diverticulosis without diverticulitis. 2. No bowel obstruction or ileus. 3. 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 No incarceration or obstruction. 4. Hepatic steatosis. Electronically Signed   By: Randa Ngo M.D.   On: 07/18/2021 17:12    Procedures Procedures   Medications Ordered in ED Medications  sodium chloride 0.9 % bolus 500 mL (500 mLs Intravenous New Bag/Given 07/18/21 1544)  iohexol (OMNIPAQUE) 350 MG/ML injection 80 mL (80 mLs Intravenous Contrast Given 07/18/21 1610)    ED Course  I have reviewed the triage vital signs and the nursing notes.  Pertinent labs & imaging results that were available during my care  of the patient were reviewed by me and considered in my medical decision making (see chart for details).  Update: I discussed the patient's case with our [facility] gastroenterology colleague.  We reviewed the patient's history, including diverticulosis, with concern for ongoing bleeding CT angiography rather than CT with contrast will be ordered.  5:53 PM Patient accompanied by her husband.  We discussed all findings including generally reassuring hemoglobin value, CT scan without evidence for obvious source of bleeding, also with no obvious mass, nor bowel obstruction. Given the patient's ongoing bleeding she will be admitted, [facility] will follow as a consulting service. MDM Rules/Calculators/A&P MDM Number of Diagnoses or Management Options  Lower GI bleed: new, needed workup   Amount and/or Complexity of Data Reviewed Clinical lab tests: ordered and reviewed Tests in the radiology section of CPT: ordered and reviewed Tests in the medicine section of CPT: reviewed and ordered Decide to obtain previous medical records or to obtain history from someone other than the patient: yes Obtain history from someone other than the patient: yes Review and summarize past medical records: yes Discuss the patient with other providers: yes Independent visualization of images, tracings, or specimens: yes  Risk of Complications, Morbidity, and/or Mortality Presenting problems: high Diagnostic procedures: high Management options: high  Critical Care Total time providing critical care: < 30 minutes  Patient Progress Patient progress: stable   Final Clinical Impression(s) / ED Diagnoses Final diagnoses:  Lower GI bleed     Gerhard Munch, MD 07/18/21 1754

## 2021-07-18 NOTE — ED Provider Notes (Addendum)
Emergency Medicine Provider Triage Evaluation Note  Melissa [name] , a 75 y.o. female  was evaluated in triage.  Pt complains of rectal bleeding starting yesterday.  Patient has a history of diverticulosis.  She reports increasing constipation and decreased caliber stool over the past 2 weeks.  Bleeding is dark red.  No pain.  She does have a history of bowel perforation and resection.  She does not take anticoagulation.  Review of Systems  Positive: Rectal bleeding Negative: Shortness of breath  Physical Exam  BP (!) 196/83 (BP Location: Left Arm)   Pulse 88   Temp 99.1 F (37.3 C) (Oral)   Resp 16   Ht 5\' 3"  (1.6 m)   Wt 87.1 kg   SpO2 97%   BMI 34.01 kg/m  Gen:   Awake, no distress   Resp:  Normal effort  MSK:   Moves extremities without difficulty  Other:  Abdomen is soft and nontender  Medical Decision Making  Medically screening exam initiated at 11:34 AM.  Appropriate orders placed.  CLELLA MCKEEL was informed that the remainder of the evaluation will be completed by another provider, this initial triage assessment does not replace that evaluation, and the importance of remaining in the ED until their evaluation is complete.    Nena Jordan, PA-C 07/18/21 1135    07/20/21, DO 07/18/21 1536

## 2021-07-18 NOTE — ED Notes (Signed)
Pt up to restroom.

## 2021-07-19 ENCOUNTER — Other Ambulatory Visit: Payer: Self-pay

## 2021-07-19 ENCOUNTER — Observation Stay (HOSPITAL_COMMUNITY): Payer: Medicare Other

## 2021-07-19 DIAGNOSIS — Q279 Congenital malformation of peripheral vascular system, unspecified: Secondary | ICD-10-CM | POA: Diagnosis not present

## 2021-07-19 DIAGNOSIS — Z20822 Contact with and (suspected) exposure to covid-19: Secondary | ICD-10-CM | POA: Diagnosis present

## 2021-07-19 DIAGNOSIS — N390 Urinary tract infection, site not specified: Secondary | ICD-10-CM | POA: Diagnosis present

## 2021-07-19 DIAGNOSIS — I10 Essential (primary) hypertension: Secondary | ICD-10-CM

## 2021-07-19 DIAGNOSIS — Z6834 Body mass index (BMI) 34.0-34.9, adult: Secondary | ICD-10-CM | POA: Diagnosis not present

## 2021-07-19 DIAGNOSIS — Z7982 Long term (current) use of aspirin: Secondary | ICD-10-CM | POA: Diagnosis not present

## 2021-07-19 DIAGNOSIS — Z87891 Personal history of nicotine dependence: Secondary | ICD-10-CM | POA: Diagnosis not present

## 2021-07-19 DIAGNOSIS — E7800 Pure hypercholesterolemia, unspecified: Secondary | ICD-10-CM | POA: Diagnosis present

## 2021-07-19 DIAGNOSIS — B961 Klebsiella pneumoniae [K. pneumoniae] as the cause of diseases classified elsewhere: Secondary | ICD-10-CM | POA: Diagnosis present

## 2021-07-19 DIAGNOSIS — D62 Acute posthemorrhagic anemia: Secondary | ICD-10-CM | POA: Diagnosis present

## 2021-07-19 DIAGNOSIS — E118 Type 2 diabetes mellitus with unspecified complications: Secondary | ICD-10-CM | POA: Diagnosis not present

## 2021-07-19 DIAGNOSIS — K922 Gastrointestinal hemorrhage, unspecified: Secondary | ICD-10-CM | POA: Diagnosis present

## 2021-07-19 DIAGNOSIS — Z79899 Other long term (current) drug therapy: Secondary | ICD-10-CM | POA: Diagnosis not present

## 2021-07-19 DIAGNOSIS — E876 Hypokalemia: Secondary | ICD-10-CM | POA: Diagnosis present

## 2021-07-19 DIAGNOSIS — Z888 Allergy status to other drugs, medicaments and biological substances status: Secondary | ICD-10-CM | POA: Diagnosis not present

## 2021-07-19 DIAGNOSIS — K644 Residual hemorrhoidal skin tags: Secondary | ICD-10-CM | POA: Diagnosis present

## 2021-07-19 DIAGNOSIS — E669 Obesity, unspecified: Secondary | ICD-10-CM | POA: Diagnosis present

## 2021-07-19 DIAGNOSIS — K5731 Diverticulosis of large intestine without perforation or abscess with bleeding: Secondary | ICD-10-CM | POA: Diagnosis present

## 2021-07-19 DIAGNOSIS — E119 Type 2 diabetes mellitus without complications: Secondary | ICD-10-CM | POA: Diagnosis present

## 2021-07-19 DIAGNOSIS — K5521 Angiodysplasia of colon with hemorrhage: Secondary | ICD-10-CM | POA: Diagnosis present

## 2021-07-19 DIAGNOSIS — K648 Other hemorrhoids: Secondary | ICD-10-CM | POA: Diagnosis present

## 2021-07-19 DIAGNOSIS — Z9049 Acquired absence of other specified parts of digestive tract: Secondary | ICD-10-CM | POA: Diagnosis not present

## 2021-07-19 LAB — CBC
HCT: 30.9 % — ABNORMAL LOW (ref 36.0–46.0)
HCT: 30.5 % — ABNORMAL LOW (ref 36.0–46.0)
HCT: 27.5 % — ABNORMAL LOW (ref 36.0–46.0)
HCT: 28.1 % — ABNORMAL LOW (ref 36.0–46.0)
HCT: 25.1 % — ABNORMAL LOW (ref 36.0–46.0)
Hemoglobin: 9.9 g/dL — ABNORMAL LOW (ref 12.0–15.0)
Hemoglobin: 9.8 g/dL — ABNORMAL LOW (ref 12.0–15.0)
Hemoglobin: 8.8 g/dL — ABNORMAL LOW (ref 12.0–15.0)
Hemoglobin: 8.9 g/dL — ABNORMAL LOW (ref 12.0–15.0)
Hemoglobin: 8 g/dL — ABNORMAL LOW (ref 12.0–15.0)
MCH: 31.1 pg (ref 26.0–34.0)
MCH: 31.5 pg (ref 26.0–34.0)
MCH: 31.5 pg (ref 26.0–34.0)
MCH: 31 pg (ref 26.0–34.0)
MCH: 31.1 pg (ref 26.0–34.0)
MCHC: 32 g/dL (ref 30.0–36.0)
MCHC: 32.1 g/dL (ref 30.0–36.0)
MCHC: 32 g/dL (ref 30.0–36.0)
MCHC: 31.7 g/dL (ref 30.0–36.0)
MCHC: 31.9 g/dL (ref 30.0–36.0)
MCV: 97.2 fL (ref 80.0–100.0)
MCV: 98.1 fL (ref 80.0–100.0)
MCV: 98.6 fL (ref 80.0–100.0)
MCV: 97.9 fL (ref 80.0–100.0)
MCV: 97.7 fL (ref 80.0–100.0)
Platelets: 251 10*3/uL (ref 150–400)
Platelets: 269 10*3/uL (ref 150–400)
Platelets: 248 10*3/uL (ref 150–400)
Platelets: 267 10*3/uL (ref 150–400)
Platelets: 226 10*3/uL (ref 150–400)
RBC: 3.18 MIL/uL — ABNORMAL LOW (ref 3.87–5.11)
RBC: 3.11 MIL/uL — ABNORMAL LOW (ref 3.87–5.11)
RBC: 2.79 MIL/uL — ABNORMAL LOW (ref 3.87–5.11)
RBC: 2.87 MIL/uL — ABNORMAL LOW (ref 3.87–5.11)
RBC: 2.57 MIL/uL — ABNORMAL LOW (ref 3.87–5.11)
RDW: 13.6 % (ref 11.5–15.5)
RDW: 13.7 % (ref 11.5–15.5)
RDW: 13.5 % (ref 11.5–15.5)
RDW: 13.5 % (ref 11.5–15.5)
RDW: 13.4 % (ref 11.5–15.5)
WBC: 7 10*3/uL (ref 4.0–10.5)
WBC: 8.1 10*3/uL (ref 4.0–10.5)
WBC: 6.5 10*3/uL (ref 4.0–10.5)
WBC: 6.9 10*3/uL (ref 4.0–10.5)
WBC: 7.7 10*3/uL (ref 4.0–10.5)
nRBC: 0 % (ref 0.0–0.2)
nRBC: 0 % (ref 0.0–0.2)
nRBC: 0 % (ref 0.0–0.2)
nRBC: 0 % (ref 0.0–0.2)
nRBC: 0 % (ref 0.0–0.2)

## 2021-07-19 LAB — GLUCOSE, CAPILLARY
Glucose-Capillary: 143 mg/dL — ABNORMAL HIGH (ref 70–99)
Glucose-Capillary: 181 mg/dL — ABNORMAL HIGH (ref 70–99)
Glucose-Capillary: 137 mg/dL — ABNORMAL HIGH (ref 70–99)
Glucose-Capillary: 104 mg/dL — ABNORMAL HIGH (ref 70–99)
Glucose-Capillary: 191 mg/dL — ABNORMAL HIGH (ref 70–99)

## 2021-07-19 MED ORDER — TECHNETIUM TC 99M-LABELED RED BLOOD CELLS IV KIT
20.8000 | PACK | Freq: Once | INTRAVENOUS | Status: AC | PRN
  Administered 2021-07-19: 20.8 via INTRAVENOUS

## 2021-07-19 NOTE — Consult Note (Signed)
[facility] Consult  Referring Provider: [facility], [facility] Primary Care Physician:  Marden Noble, MD Primary Gastroenterologist: Dr. Laural Benes  Reason for Consultation: Painless hematochezia  HPI: Melissa [name] is a 75 y.o. female called me yesterday with complains of multiple episodes of bright red blood per rectum, and I directed the patient to [facility], ER. I briefly saw her in the waiting room before she was evaluated in the ER. Patient had at least 5 bloody bowel movements starting yesterday, which continued in the ER, overnight last bowel movement was 12 AM and then she reports 2 further bloody bowel movements today morning. This is all painless and not associated with nausea or vomiting. Patient is known to have diverticulosis. CT angio performed in ER showed diffuse colonic diverticulosis but no active GI bleed. Patient is on aspirin 81 mg at home, has not taken it for 2 days and takes ibuprofen as needed. Last colonoscopy by Dr. Laural Benes 2018. Colonoscopy in 2004-showed left and right colon diverticulosis. History of perforated diverticulitis requiring colonic resection colostomy which was reversed in 1994.  Past Medical History:  Diagnosis Date   Diabetes mellitus without complication (HCC)    Diverticulosis    Hypercholesteremia    Hypertension    Kidney stones     Past Surgical History:  Procedure Laterality Date   CESAREAN SECTION     COLON SURGERY     COLONOSCOPY WITH PROPOFOL N/A 10/12/2016   Procedure: COLONOSCOPY WITH PROPOFOL;  Surgeon: Charolett Bumpers, MD;  Location: [facility];  Service: Endoscopy;  Laterality: N/A;    Prior to Admission medications   Medication Sig Start Date End Date Taking? Authorizing Provider  acetaminophen (TYLENOL) 500 MG tablet Take 500-1,000 mg by mouth every 6 (six) hours as needed for mild pain or headache.   Yes [provider]  Alpha-Lipoic Acid 600 MG CAPS Take 600 mg by mouth in the morning.   Yes  [provider]  aspirin EC 81 MG tablet Take 81 mg by mouth daily with supper.   Yes [provider]  B Complex Vitamins (VITAMIN B COMPLEX) TABS Take 1 tablet by mouth in the morning.   Yes [provider]  Biotin 2500 MCG CAPS Take 2,500 mcg by mouth daily at 2 PM.   Yes [provider]  Calcium Carbonate (CALCIUM 500 PO) Take 1,000 mg by mouth every evening.   Yes [provider]  calcium carbonate (TUMS - DOSED IN MG ELEMENTAL CALCIUM) 500 MG chewable tablet Chew 1-2 tablets by mouth at bedtime as needed for indigestion or heartburn.   Yes [provider]  Cholecalciferol (VITAMIN D3) 50 MCG (2000 UT) TABS Take 2,000 Units by mouth daily at 2 PM.   Yes [provider]  Coenzyme Q10 (CO Q10) 100 MG CAPS Take 100 mg by mouth every evening.   Yes [provider]  FIBER PO Take 1 capsule by mouth in the morning and at bedtime.   Yes [provider]  Flaxseed, Linseed, (FLAX SEED OIL) 1000 MG CAPS Take 1,000 mg by mouth every evening.   Yes [provider]  glimepiride (AMARYL) 2 MG tablet Take 2-3 mg by mouth See admin instructions. Take 3 mg by mouth in the morning with breakfast and 2 mg with supper   Yes [provider]  ibuprofen (ADVIL,MOTRIN) 200 MG tablet Take 200-400 mg by mouth every 6 (six) hours as needed for mild pain, moderate pain or headache.   Yes [provider]  losartan (COZAAR) 100 MG tablet Take 100 mg by mouth every evening.   Yes [provider]  Multiple Vitamin (MULTIVITAMIN WITH MINERALS) TABS tablet Take 1 tablet by mouth daily with breakfast.   Yes [provider]  Omega-3 Fatty Acids (FISH OIL PO) Take 1 capsule by mouth every evening.   Yes [provider]  omeprazole (PRILOSEC OTC) 20 MG tablet Take 20 mg by mouth See admin instructions. Take 20 mg by mouth in the morning as needed for heartburn   Yes [provider]   polyethylene glycol powder (GLYCOLAX/MIRALAX) 17 GM/SCOOP powder Take 17 g by mouth See admin instructions. Mix 17 grams of powder into 4-8 ounces of water and drink in the morning as needed for constipation   Yes [provider]  rosuvastatin (CRESTOR) 5 MG tablet Take 5 mg by mouth at bedtime.   Yes [provider]  TURMERIC PO Take 1 capsule by mouth in the morning.   Yes [provider]  vitamin C (ASCORBIC ACID) 500 MG tablet Take 500 mg by mouth in the morning and at bedtime.   Yes [provider]  magnesium oxide (MAG-OX) 400 MG tablet Take 400 mg by mouth daily. Patient not taking: Reported on 07/18/2021    [provider]  spironolactone (ALDACTONE) 25 MG tablet Take 25 mg by mouth daily. Patient not taking: Reported on 07/18/2021    [provider]    Current Facility-Administered Medications  Medication Dose Route Frequency Provider Last Rate Last Admin   0.9 %  sodium chloride infusion   Intravenous Continuous Rise Patience, MD 100 mL/hr at 07/19/21 0037 New Bag at 07/19/21 0037   acetaminophen (TYLENOL) tablet 650 mg  650 mg Oral Q6H PRN Rise Patience, MD       Or   acetaminophen (TYLENOL) suppository 650 mg  650 mg Rectal Q6H PRN Rise Patience, MD       hydrALAZINE (APRESOLINE) injection 10 mg  10 mg Intravenous Q4H PRN Rise Patience, MD       insulin aspart (novoLOG) injection 0-9 Units  0-9 Units Subcutaneous Q4H Rise Patience, MD   1 Units at 07/19/21 0500    Allergies as of 07/18/2021 - Review Complete 07/18/2021  Allergen Reaction Noted   Ciprofloxacin Other (See Comments) 04/08/2020   Amlodipine besylate Other (See Comments) 06/06/2021   Atorvastatin Other (See Comments) 06/06/2021   Metformin hcl er Diarrhea 06/06/2021   Spironolactone Other (See Comments) 06/06/2021    History reviewed. No pertinent family history.  Social History   Socioeconomic History   Marital status:  Married    Spouse name: Not on file   Number of children: Not on file   Years of education: Not on file   Highest education level: Not on file  Occupational History   Not on file  Tobacco Use   Smoking status: Former    Packs/day: 0.50    Years: 2.00    Pack years: 1.00    Types: Cigarettes   Smokeless tobacco: Never  Substance and Sexual Activity   Alcohol use: Yes    Comment: occasional   Drug use: No   Sexual activity: Not on file  Other Topics Concern   Not on file  Social History Narrative   Not on file   Social Determinants of Health   Financial Resource Strain: Not on file  Food Insecurity: Not on file  Transportation Needs: Not on file  Physical Activity:  Not on file  Stress: Not on file  Social Connections: Not on file  Intimate Partner Violence: Not on file    Review of Systems: Positive for: GI: Described in detail in HPI.    Gen: Denies any fever, chills, rigors, night sweats, anorexia, fatigue, weakness, malaise, involuntary weight loss, and sleep disorder CV: Denies chest pain, angina, palpitations, syncope, orthopnea, PND, peripheral edema, and claudication. Resp: Denies dyspnea, cough, sputum, wheezing, coughing up blood. GU : Denies urinary burning, blood in urine, urinary frequency, urinary hesitancy, nocturnal urination, and urinary incontinence. MS: Denies joint pain or swelling.  Denies muscle weakness, cramps, atrophy.  Derm: Denies rash, itching, oral ulcerations, hives, unhealing ulcers.  Psych: Denies depression, anxiety, memory loss, suicidal ideation, hallucinations,  and confusion. Heme: Denies bruising and enlarged lymph nodes. Neuro:  Denies any headaches, dizziness, paresthesias. Endo:  DM, denies any problems with thyroid, adrenal function.  Physical Exam: Vital signs in last 24 hours: Temp:  [98.4 F (36.9 C)-99.1 F (37.3 C)] 98.6 F (37 C) (11/20 0803) Pulse Rate:  [80-94] 82 (11/20 0803) Resp:  [16-24] 18 (11/20 0803) BP:  (127-196)/(52-87) 127/55 (11/20 0803) SpO2:  [94 %-97 %] 94 % (11/20 0803) Weight:  [87.1 kg] 87.1 kg (11/19 1051) Last BM Date: 07/18/21  General:   Alert,  Well-developed, well-nourished, pleasant and cooperative in NAD Head:  Normocephalic and atraumatic. Eyes:  Sclera clear, no icterus.  Prominent pallor Ears:  Normal auditory acuity. Nose:  No deformity, discharge,  or lesions. Mouth:  No deformity or lesions.  Oropharynx pink & moist. Neck:  Supple; no masses or thyromegaly. Lungs:  Clear throughout to auscultation.   No wheezes, crackles, or rhonchi. No acute distress. Heart:  Regular rate and rhythm; no murmurs, clicks, rubs,  or gallops. Extremities:  Without clubbing or edema. Neurologic:  Alert and  oriented x4;  grossly normal neurologically. Skin:  Intact without significant lesions or rashes. Psych:  Alert and cooperative. Normal mood and affect. Abdomen:  Soft, nontender and nondistended. No masses, hepatosplenomegaly or hernias noted. Normal bowel sounds, without guarding, and without rebound.         Lab Results: Recent Labs    07/18/21 1126 07/19/21 0114 07/19/21 0540  WBC 8.6 7.0 8.1  HGB 12.7 9.9* 9.8*  HCT 39.4 30.9* 30.5*  PLT 295 251 269   BMET Recent Labs    07/18/21 1126  NA 138  K 4.4  CL 105  CO2 25  GLUCOSE 199*  BUN 23  CREATININE 0.79  CALCIUM 9.7   LFT Recent Labs    07/18/21 1126  PROT 8.0  ALBUMIN 4.2  AST 23  ALT 24  ALKPHOS 81  BILITOT 0.5   PT/INR No results for input(s): LABPROT, INR in the last 72 hours.  Studies/Results: CT ANGIO GI BLEED  Result Date: 07/18/2021 CLINICAL DATA:  Constipation, blood in stool EXAM: CTA ABDOMEN AND PELVIS WITHOUT AND WITH CONTRAST TECHNIQUE: Multidetector CT imaging of the abdomen and pelvis was performed using the standard protocol during bolus administration of intravenous contrast. Multiplanar reconstructed images and MIPs were obtained and reviewed to evaluate the vascular  anatomy. CONTRAST:  64mL OMNIPAQUE IOHEXOL 350 MG/ML SOLN COMPARISON:  None. FINDINGS: VASCULAR Aorta: Normal caliber aorta without aneurysm, dissection, vasculitis or significant stenosis. Minimal atherosclerosis. Celiac: Patent without evidence of aneurysm, dissection, vasculitis or significant stenosis. SMA: Patent without evidence of aneurysm, dissection, vasculitis or significant stenosis. Renals: There are 3 left renal arteries. The 2 superior left renal arteries are  widely patent without atherosclerosis, aneurysm, or vasculitis. The most inferior left renal artery demonstrates mild atherosclerosis at its origin, with less than 50% stenosis. 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 The main renal artery is unremarkable. Accessory right renal artery supplying portions of the lower pole right kidney are unremarkable without stenosis, aneurysm, or vasculitis. IMA: Patent without evidence of aneurysm, dissection, vasculitis or significant stenosis. Inflow: Patent without evidence of aneurysm, dissection, vasculitis or significant stenosis. Proximal Outflow: Bilateral common femoral and visualized portions of the superficial and profunda femoral arteries are patent without evidence of aneurysm, dissection, vasculitis or significant stenosis. Veins: No obvious venous abnormality within the limitations of this arterial phase study. Review of the MIP images confirms the above findings. NON-VASCULAR Lower chest: No acute pleural or parenchymal lung disease. Hepatobiliary: Mild diffuse hepatic steatosis. No focal liver abnormality. The gallbladder is unremarkable. Pancreas: Unremarkable. No pancreatic ductal dilatation or surrounding inflammatory changes. Spleen: Normal in size without focal abnormality. Adrenals/Urinary Tract: Adrenal glands are unremarkable. Kidneys are normal, without renal calculi, focal lesion, or hydronephrosis. Bladder is minimally distended. Streak artifact from right hip arthroplasty limits  evaluation of the bladder. Stomach/Bowel: No bowel obstruction or ileus. Postsurgical changes are seen from sigmoid colon resection and reanastomosis. There is diffuse colonic diverticulosis without evidence of acute diverticulitis. There is no evidence of intraluminal contrast accumulation to suggest active gastrointestinal hemorrhage. Lymphatic: No pathologic adenopathy within the abdomen or pelvis. Reproductive: Uterus and bilateral adnexa are unremarkable. Other: No free fluid or free gas. There is a Forensic psychologist type umbilical hernia containing the anti mesenteric aspect of the mid transverse colon. No evidence of bowel incarceration or obstruction. Musculoskeletal: No acute or destructive bony lesions. Right hip arthroplasty is unremarkable. Reconstructed images demonstrate no additional findings. IMPRESSION: VASCULAR 1. No evidence of active gastrointestinal hemorrhage. 2.  Aortic Atherosclerosis (ICD10-I70.0). NON-VASCULAR 1. Diffuse colonic diverticulosis without diverticulitis. 2. No bowel obstruction or ileus. 3. 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 No incarceration or obstruction. 4. Hepatic steatosis. Electronically Signed   By: Randa Ngo M.D.   On: 07/18/2021 17:12    Impression: Painless hematochezia Diffuse colonic diverticulosis Hemoglobin on presentation was 12.7, dropped to 9.9 but has remained stable at 9.8 and 9.8  Plan: Presumed diverticular bleeding without active bleeding noted on CT angio Recommend H&H monitoring every 8 hours, plan to transfuse if hemoglobin is 7 or less If patient has large hematochezia or hemodynamic instability, recommend stat NM GI bleeding scan to evaluate for active bleeding, and consult IR if active bleeding is noted for embolization. Will start patient on clear liquid diet.   LOS: 0 days   Ronnette Juniper, MD  07/19/2021, 9:16 AM

## 2021-07-19 NOTE — Progress Notes (Signed)
PROGRESS NOTE    Melissa [name]  PFX:[id number] DOB: [birth year] DOA: 07/18/2021 PCP: Marden Noble, MD    Brief Narrative:  75 y.o. female with history of prior GI bleed in 2017 and subsequent colonoscopy in 2018 was unremarkable with history of diabetes mellitus hypertension and hyperlipidemia presents to the ER with complaints of having multiple episodes of bleeding per rectum bright blood per rectum.  Denies any abdominal pain vomiting. In the ED, CTA without active bleed seen. Presenting hgb 12.7  Assessment & Plan:   Principal Problem:   Acute GI bleeding Active Problems:   Essential hypertension   Diabetes mellitus with complication (HCC)   Acute GI bleed likely diverticular origin given the CT scan that showed diverticulosis Aspirin currently on hold CTA neg for bleed GI consulted with recs for f/u NM bleeding scan, pending results Cont to follow serial hgb. Thus far, pt has dropped over 4gm hgb since presentation If active bleed noted, then would ask IR to see Recheck CBC in AM History of hypertension  BP stable Continued on PRN hydralazine Given downtrending hgb, would cont to hold home bp meds for now Diabetes mellitus type 2  Cont with SSI coverage as needed Hold oral hyperglycemic meds while in hospital History of hyperlipidemia. Cholesterol meds currently on hold Would resume when acute blood loss anemia is stabilized/resolved  DVT prophylaxis: SCD's Code Status: Full Family Communication: Pt in room, family at bedside  Status is: Observation  The patient will require care spanning > 2 midnights and should be moved to inpatient because: severity of illness with down trending hgb that may need blood transfusion or diagnostic workup   Consultants:  GI  Procedures:    Antimicrobials: Anti-infectives (From admission, onward)    None       Subjective: Denies abd pain. Still having BRBPR  Objective: Vitals:   07/18/21 2351 07/19/21 0415  07/19/21 0803 07/19/21 1159  BP: (!) 159/77 (!) 138/52 (!) 127/55 (!) 148/60  Pulse: 94 80 82 84  Resp: (!) 24 20 18 18   Temp: 98.7 F (37.1 C) 98.4 F (36.9 C) 98.6 F (37 C)   TempSrc: Oral Oral Oral   SpO2: 96% 96% 94% 97%  Weight:      Height:        Intake/Output Summary (Last 24 hours) at 07/19/2021 1617 Last data filed at 07/19/2021 1411 Gross per 24 hour  Intake 1329.98 ml  Output --  Net 1329.98 ml   Filed Weights   07/18/21 1051  Weight: 87.1 kg    Examination: General exam: Awake, laying in bed, in nad Respiratory system: Normal respiratory effort, no wheezing Cardiovascular system: regular rate, s1, s2 Gastrointestinal system: Soft, nondistended, positive BS Central nervous system: CN2-12 grossly intact, strength intact Extremities: Perfused, no clubbing Skin: Normal skin turgor, no notable skin lesions seen Psychiatry: Mood normal // no visual hallucinations   Data Reviewed: I have personally reviewed following labs and imaging studies  CBC: Recent Labs  Lab 07/18/21 1126 07/19/21 0114 07/19/21 0540 07/19/21 1443  WBC 8.6 7.0 8.1 6.5  HGB 12.7 9.9* 9.8* 8.8*  HCT 39.4 30.9* 30.5* 27.5*  MCV 98.7 97.2 98.1 98.6  PLT 295 251 269 248   Basic Metabolic Panel: Recent Labs  Lab 07/18/21 1126  NA 138  K 4.4  CL 105  CO2 25  GLUCOSE 199*  BUN 23  CREATININE 0.79  CALCIUM 9.7   GFR: Estimated Creatinine Clearance: 61.6 mL/min (by C-G formula based on SCr of  0.79 mg/dL). Liver Function Tests: Recent Labs  Lab 07/18/21 1126  AST 23  ALT 24  ALKPHOS 81  BILITOT 0.5  PROT 8.0  ALBUMIN 4.2   No results for input(s): LIPASE, AMYLASE in the last 168 hours. No results for input(s): AMMONIA in the last 168 hours. Coagulation Profile: No results for input(s): INR, PROTIME in the last 168 hours. Cardiac Enzymes: No results for input(s): CKTOTAL, CKMB, CKMBINDEX, TROPONINI in the last 168 hours. BNP (last 3 results) No results for input(s):  PROBNP in the last 8760 hours. HbA1C: No results for input(s): HGBA1C in the last 72 hours. CBG: Recent Labs  Lab 07/18/21 2105 07/19/21 0419 07/19/21 0801 07/19/21 1200  GLUCAP 117* 143* 181* 137*   Lipid Profile: No results for input(s): CHOL, HDL, LDLCALC, TRIG, CHOLHDL, LDLDIRECT in the last 72 hours. Thyroid Function Tests: No results for input(s): TSH, T4TOTAL, FREET4, T3FREE, THYROIDAB in the last 72 hours. Anemia Panel: No results for input(s): VITAMINB12, FOLATE, FERRITIN, TIBC, IRON, RETICCTPCT in the last 72 hours. Sepsis Labs: No results for input(s): PROCALCITON, LATICACIDVEN in the last 168 hours.  Recent Results (from the past 240 hour(s))  Resp Panel by RT-PCR (Flu A&B, Covid) Nasopharyngeal Swab     Status: None   Collection Time: 07/18/21  6:30 PM   Specimen: Nasopharyngeal Swab; Nasopharyngeal(NP) swabs in vial transport medium  Result Value Ref Range Status   SARS Coronavirus 2 by RT PCR NEGATIVE NEGATIVE Final    Comment: (NOTE) SARS-CoV-2 target nucleic acids are NOT DETECTED.  The SARS-CoV-2 RNA is generally detectable in upper respiratory specimens during the acute phase of infection. The lowest concentration of SARS-CoV-2 viral copies this assay can detect is 138 copies/mL. A negative result does not preclude SARS-Cov-2 infection and should not be used as the sole basis for treatment or other patient management decisions. A negative result may occur with  improper specimen collection/handling, submission of specimen other than nasopharyngeal swab, presence of viral mutation(s) within the areas targeted by this assay, and inadequate number of viral copies(<138 copies/mL). A negative result must be combined with clinical observations, patient history, and epidemiological information. The expected result is Negative.  Fact Sheet for Patients:  EntrepreneurPulse.com.au  Fact Sheet for Healthcare Providers:   IncredibleEmployment.be  This test is no t yet approved or cleared by the [place] FDA and  has been authorized for detection and/or diagnosis of SARS-CoV-2 by FDA under an Emergency Use Authorization (EUA). This EUA will remain  in effect (meaning this test can be used) for the duration of the COVID-19 declaration under Section 564(b)(1) of the Act, 21 U.S.C.section 360bbb-3(b)(1), unless the authorization is terminated  or revoked sooner.       Influenza A by PCR NEGATIVE NEGATIVE Final   Influenza B by PCR NEGATIVE NEGATIVE Final    Comment: (NOTE) The Xpert Xpress SARS-CoV-2/FLU/RSV plus assay is intended as an aid in the diagnosis of influenza from Nasopharyngeal swab specimens and should not be used as a sole basis for treatment. Nasal washings and aspirates are unacceptable for Xpert Xpress SARS-CoV-2/FLU/RSV testing.  Fact Sheet for Patients: EntrepreneurPulse.com.au  Fact Sheet for Healthcare Providers: IncredibleEmployment.be  This test is not yet approved or cleared by the [place] FDA and has been authorized for detection and/or diagnosis of SARS-CoV-2 by FDA under an Emergency Use Authorization (EUA). This EUA will remain in effect (meaning this test can be used) for the duration of the COVID-19 declaration under Section 564(b)(1) of the Act, 21  U.S.C. section 360bbb-3(b)(1), unless the authorization is terminated or revoked.  Performed at [facility], [place] [address] [place], [place] [address]      Radiology Studies: CT ANGIO GI BLEED  Result Date: 07/18/2021 CLINICAL DATA:  Constipation, blood in stool EXAM: CTA ABDOMEN AND PELVIS WITHOUT AND WITH CONTRAST TECHNIQUE: Multidetector CT imaging of the abdomen and pelvis was performed using the standard protocol during bolus administration of intravenous contrast. Multiplanar reconstructed images and MIPs were obtained and  reviewed to evaluate the vascular anatomy. CONTRAST:  39mL OMNIPAQUE IOHEXOL 350 MG/ML SOLN COMPARISON:  None. FINDINGS: VASCULAR Aorta: Normal caliber aorta without aneurysm, dissection, vasculitis or significant stenosis. Minimal atherosclerosis. Celiac: Patent without evidence of aneurysm, dissection, vasculitis or significant stenosis. SMA: Patent without evidence of aneurysm, dissection, vasculitis or significant stenosis. Renals: There are 3 left renal arteries. The 2 superior left renal arteries are widely patent without atherosclerosis, aneurysm, or vasculitis. The most inferior left renal artery demonstrates mild atherosclerosis at its origin, with less than 50% stenosis. 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 The main renal artery is unremarkable. Accessory right renal artery supplying portions of the lower pole right kidney are unremarkable without stenosis, aneurysm, or vasculitis. IMA: Patent without evidence of aneurysm, dissection, vasculitis or significant stenosis. Inflow: Patent without evidence of aneurysm, dissection, vasculitis or significant stenosis. Proximal Outflow: Bilateral common femoral and visualized portions of the superficial and profunda femoral arteries are patent without evidence of aneurysm, dissection, vasculitis or significant stenosis. Veins: No obvious venous abnormality within the limitations of this arterial phase study. Review of the MIP images confirms the above findings. NON-VASCULAR Lower chest: No acute pleural or parenchymal lung disease. Hepatobiliary: Mild diffuse hepatic steatosis. No focal liver abnormality. The gallbladder is unremarkable. Pancreas: Unremarkable. No pancreatic ductal dilatation or surrounding inflammatory changes. Spleen: Normal in size without focal abnormality. Adrenals/Urinary Tract: Adrenal glands are unremarkable. Kidneys are normal, without renal calculi, focal lesion, or hydronephrosis. Bladder is minimally distended. Streak artifact  from right hip arthroplasty limits evaluation of the bladder. Stomach/Bowel: No bowel obstruction or ileus. Postsurgical changes are seen from sigmoid colon resection and reanastomosis. There is diffuse colonic diverticulosis without evidence of acute diverticulitis. There is no evidence of intraluminal contrast accumulation to suggest active gastrointestinal hemorrhage. Lymphatic: No pathologic adenopathy within the abdomen or pelvis. Reproductive: Uterus and bilateral adnexa are unremarkable. Other: No free fluid or free gas. There is a Forensic psychologist type umbilical hernia containing the anti mesenteric aspect of the mid transverse colon. No evidence of bowel incarceration or obstruction. Musculoskeletal: No acute or destructive bony lesions. Right hip arthroplasty is unremarkable. Reconstructed images demonstrate no additional findings. IMPRESSION: VASCULAR 1. No evidence of active gastrointestinal hemorrhage. 2.  Aortic Atherosclerosis (ICD10-I70.0). NON-VASCULAR 1. Diffuse colonic diverticulosis without diverticulitis. 2. No bowel obstruction or ileus. 3. 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 No incarceration or obstruction. 4. Hepatic steatosis. Electronically Signed   By: Randa Ngo M.D.   On: 07/18/2021 17:12    Scheduled Meds:  insulin aspart  0-9 Units Subcutaneous Q4H   Continuous Infusions:  sodium chloride 100 mL/hr at 07/19/21 1515     LOS: 0 days   Marylu Lund, MD [facility] Pager On Amion  If 7PM-7AM, please contact night-coverage 07/19/2021, 4:17 PM

## 2021-07-19 NOTE — Progress Notes (Signed)
Patient has had large bright red bowel movement this afternoon. Paged MD.

## 2021-07-19 NOTE — Consult Note (Signed)
Chief Complaint: GI bleeding  Referring Physician(s): Dr. Wyline Copas  Supervising Physician: Corrie Mckusick  Patient Status: [facility] - In-pt  History of Present Illness: Melissa [name] is a 75 y.o. female admitted to [facility] via the ED 11/19 with complain of bloody bowel movement.   She tells me that she had 2 or 3 episodes of bloody stool at home, with multiple more here at the hospital.  She tells me "fifteen episodes" in total.  She does let me know that the frequency is decreasing, and that the last few have only been some more solid clots and stool, and no longer watery stool with frank blood.  She denies any abdominal pain.   She has had one episode of prior bloody stool in early 2018 by her report, which was treated conservatively.  She had prior colon resection in the early 1990's, with a reversal of colostomy in 1994.  She says she has previously been getting screening colonoscopy, with a 10-year interval based on her risk/prior findings.    She has been taking daily aspirin.  She denies any blood thinners or plavix.   Currently she is comfortable in floor bed, without heart monitoring.  Last vital signs are: BP 154, HR 92, RR 16, O2 96% RA.   Labs: H&H: 12.7/39.4 --> 9.9/30.9 --> 9.8/30.5 --> 8.8/27.,5 --> 8.9/28.1 She has had no transfusion WBC: 6.9 Platelets: 267 Na: 138 K: 4.4 BUHN 23 Cr. 0.79  CTA 11/19 was negative for evidence of GI bleeding  A NM study today was positive, with questionable episodic bleeding at the splenic flexure.   Past Medical History:  Diagnosis Date   Diabetes mellitus without complication ([place])    Diverticulosis    Hypercholesteremia    Hypertension    Kidney stones     Past Surgical History:  Procedure Laterality Date   CESAREAN SECTION     COLON SURGERY     COLONOSCOPY WITH PROPOFOL N/A 10/12/2016   Procedure: COLONOSCOPY WITH PROPOFOL;  Surgeon: Garlan Fair, MD;  Location: [facility] ENDOSCOPY;  Service: Endoscopy;  Laterality: N/A;     Allergies: Ciprofloxacin, Amlodipine besylate, Atorvastatin, Metformin hcl er, and Spironolactone  Medications: Prior to Admission medications   Medication Sig Start Date End Date Taking? Authorizing Provider  acetaminophen (TYLENOL) 500 MG tablet Take 500-1,000 mg by mouth every 6 (six) hours as needed for mild pain or headache.   Yes [provider]  Alpha-Lipoic Acid 600 MG CAPS Take 600 mg by mouth in the morning.   Yes [provider]  aspirin EC 81 MG tablet Take 81 mg by mouth daily with supper.   Yes [provider]  B Complex Vitamins (VITAMIN B COMPLEX) TABS Take 1 tablet by mouth in the morning.   Yes [provider]  Biotin 2500 MCG CAPS Take 2,500 mcg by mouth daily at 2 PM.   Yes [provider]  Calcium Carbonate (CALCIUM 500 PO) Take 1,000 mg by mouth every evening.   Yes [provider]  calcium carbonate (TUMS - DOSED IN MG ELEMENTAL CALCIUM) 500 MG chewable tablet Chew 1-2 tablets by mouth at bedtime as needed for indigestion or heartburn.   Yes [provider]  Cholecalciferol (VITAMIN D3) 50 MCG (2000 UT) TABS Take 2,000 Units by mouth daily at 2 PM.   Yes [provider]  Coenzyme Q10 (CO Q10) 100 MG CAPS Take 100 mg by mouth every evening.   Yes [provider]  FIBER PO Take 1  capsule by mouth in the morning and at bedtime.   Yes [provider]  Flaxseed, Linseed, (FLAX SEED OIL) 1000 MG CAPS Take 1,000 mg by mouth every evening.   Yes [provider]  glimepiride (AMARYL) 2 MG tablet Take 2-3 mg by mouth See admin instructions. Take 3 mg by mouth in the morning with breakfast and 2 mg with supper   Yes [provider]  ibuprofen (ADVIL,MOTRIN) 200 MG tablet Take 200-400 mg by mouth every 6 (six) hours as needed for mild pain, moderate pain or headache.   Yes [provider]  losartan (COZAAR) 100 MG tablet Take 100 mg by mouth every evening.   Yes  [provider]  Multiple Vitamin (MULTIVITAMIN WITH MINERALS) TABS tablet Take 1 tablet by mouth daily with breakfast.   Yes [provider]  Omega-3 Fatty Acids (FISH OIL PO) Take 1 capsule by mouth every evening.   Yes [provider]  omeprazole (PRILOSEC OTC) 20 MG tablet Take 20 mg by mouth See admin instructions. Take 20 mg by mouth in the morning as needed for heartburn   Yes [provider]  polyethylene glycol powder (GLYCOLAX/MIRALAX) 17 GM/SCOOP powder Take 17 g by mouth See admin instructions. Mix 17 grams of powder into 4-8 ounces of water and drink in the morning as needed for constipation   Yes [provider]  rosuvastatin (CRESTOR) 5 MG tablet Take 5 mg by mouth at bedtime.   Yes [provider]  TURMERIC PO Take 1 capsule by mouth in the morning.   Yes [provider]  vitamin C (ASCORBIC ACID) 500 MG tablet Take 500 mg by mouth in the morning and at bedtime.   Yes [provider]  magnesium oxide (MAG-OX) 400 MG tablet Take 400 mg by mouth daily. Patient not taking: Reported on 07/18/2021    [provider]  spironolactone (ALDACTONE) 25 MG tablet Take 25 mg by mouth daily. Patient not taking: Reported on 07/18/2021    [provider]     History reviewed. No pertinent family history.  Social History   Socioeconomic History   Marital status: Married    Spouse name: Not on file   Number of children: Not on file   Years of education: Not on file   Highest education level: Not on file  Occupational History   Not on file  Tobacco Use   Smoking status: Former    Packs/day: 0.50    Years: 2.00    Pack years: 1.00    Types: Cigarettes   Smokeless tobacco: Never  Substance and Sexual Activity   Alcohol use: Yes    Comment: occasional   Drug use: No   Sexual activity: Not on file  Other Topics Concern   Not on file  Social History Narrative   Not on file   Social  Determinants of Health   Financial Resource Strain: Not on file  Food Insecurity: Not on file  Transportation Needs: Not on file  Physical Activity: Not on file  Stress: Not on file  Social Connections: Not on file       Review of Systems: A 12 point ROS discussed and pertinent positives are indicated in the HPI above.  All other systems are negative.  Review of Systems  Vital Signs: BP (!) 154/76 (BP Location: Right Arm)   Pulse 92   Temp 97.9 F (36.6 C) (Oral)   Resp 16   Ht 5\' 3"  (1.6 m)  Wt 87.1 kg   SpO2 96%   BMI 34.01 kg/m   Physical Exam General: 75 yo female appearing stated age.  Well-developed, well-nourished.  No distress. HEENT: Atraumatic, normocephalic.  Conjugate gaze, extra-ocular motor intact. No scleral icterus or scleral injection. No lesions on external ears, nose, lips, or gums.  Oral mucosa moist, pink.  Neck: Symmetric with no goiter enlargement.  Chest/Lungs:  Symmetric chest with inspiration/expiration.  No labored breathing.     Heart:  No JVD appreciated.  Abdomen:  Soft, NT/ND, obese Genito-urinary: Deferred Neurologic: Alert & Oriented to person, place, and time.   Normal affect and insight.  Appropriate questions.  Moving all 4 extremities with gross sensory intact.  Pulse Exam:  Palpable CFA pulses bilateral  Imaging: NM GI Blood Loss  Result Date: 07/19/2021 CLINICAL DATA:  Lower gastrointestinal hemorrhage EXAM: NUCLEAR MEDICINE GASTROINTESTINAL BLEEDING SCAN TECHNIQUE: Sequential abdominal images were obtained following intravenous administration of Tc-64m labeled red blood cells. RADIOPHARMACEUTICALS:  20.8 mCi Tc-70m pertechnetate in-vitro labeled red cells. COMPARISON:  CTA 07/18/2021 FINDINGS: There is active gastrointestinal hemorrhage identified at the fifty-seventh amended involving the splenic flexure of the colon. This hemorrhage is episodic in nature but continues through the ensuing our to fill the transverse colon retrograde  fashion. IMPRESSION: Active gastrointestinal hemorrhage within the splenic flexure of the colon. Note that the hemorrhages episodic in nature. Electronically Signed   By: Fidela Salisbury M.D.   On: 07/19/2021 19:27   CT ANGIO GI BLEED  Result Date: 07/18/2021 CLINICAL DATA:  Constipation, blood in stool EXAM: CTA ABDOMEN AND PELVIS WITHOUT AND WITH CONTRAST TECHNIQUE: Multidetector CT imaging of the abdomen and pelvis was performed using the standard protocol during bolus administration of intravenous contrast. Multiplanar reconstructed images and MIPs were obtained and reviewed to evaluate the vascular anatomy. CONTRAST:  75mL OMNIPAQUE IOHEXOL 350 MG/ML SOLN COMPARISON:  None. FINDINGS: VASCULAR Aorta: Normal caliber aorta without aneurysm, dissection, vasculitis or significant stenosis. Minimal atherosclerosis. Celiac: Patent without evidence of aneurysm, dissection, vasculitis or significant stenosis. SMA: Patent without evidence of aneurysm, dissection, vasculitis or significant stenosis. Renals: There are 3 left renal arteries. The 2 superior left renal arteries are widely patent without atherosclerosis, aneurysm, or vasculitis. The most inferior left renal artery demonstrates mild atherosclerosis at its origin, with less than 50% stenosis. 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 The main renal artery is unremarkable. Accessory right renal artery supplying portions of the lower pole right kidney are unremarkable without stenosis, aneurysm, or vasculitis. IMA: Patent without evidence of aneurysm, dissection, vasculitis or significant stenosis. Inflow: Patent without evidence of aneurysm, dissection, vasculitis or significant stenosis. Proximal Outflow: Bilateral common femoral and visualized portions of the superficial and profunda femoral arteries are patent without evidence of aneurysm, dissection, vasculitis or significant stenosis. Veins: No obvious venous abnormality within the limitations of this  arterial phase study. Review of the MIP images confirms the above findings. NON-VASCULAR Lower chest: No acute pleural or parenchymal lung disease. Hepatobiliary: Mild diffuse hepatic steatosis. No focal liver abnormality. The gallbladder is unremarkable. Pancreas: Unremarkable. No pancreatic ductal dilatation or surrounding inflammatory changes. Spleen: Normal in size without focal abnormality. Adrenals/Urinary Tract: Adrenal glands are unremarkable. Kidneys are normal, without renal calculi, focal lesion, or hydronephrosis. Bladder is minimally distended. Streak artifact from right hip arthroplasty limits evaluation of the bladder. Stomach/Bowel: No bowel obstruction or ileus. Postsurgical changes are seen from sigmoid colon resection and reanastomosis. There is diffuse colonic diverticulosis without evidence of acute diverticulitis. There is no evidence of  intraluminal contrast accumulation to suggest active gastrointestinal hemorrhage. Lymphatic: No pathologic adenopathy within the abdomen or pelvis. Reproductive: Uterus and bilateral adnexa are unremarkable. Other: No free fluid or free gas. There is a [employer] treasurerichter type umbilical hernia containing the anti mesenteric aspect of the mid transverse colon. No evidence of bowel incarceration or obstruction. Musculoskeletal: No acute or destructive bony lesions. Right hip arthroplasty is unremarkable. Reconstructed images demonstrate no additional findings. IMPRESSION: VASCULAR 1. No evidence of active gastrointestinal hemorrhage. 2.  Aortic Atherosclerosis (ICD10-I70.0). NON-VASCULAR 1. Diffuse colonic diverticulosis without diverticulitis. 2. No bowel obstruction or ileus. 3. 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 No incarceration or obstruction. 4. Hepatic steatosis. Electronically Signed   By: Sharlet SalinaMichael  Brown M.D.   On: 07/18/2021 17:12    Labs:  CBC: Recent Labs    07/19/21 0114 07/19/21 0540 07/19/21 1443 07/19/21 1857  WBC 7.0  8.1 6.5 6.9  HGB 9.9* 9.8* 8.8* 8.9*  HCT 30.9* 30.5* 27.5* 28.1*  PLT 251 269 248 267    COAGS: No results for input(s): INR, APTT in the last 8760 hours.  BMP: Recent Labs    07/18/21 1126  NA 138  K 4.4  CL 105  CO2 25  GLUCOSE 199*  BUN 23  CALCIUM 9.7  CREATININE 0.79  GFRNONAA >60    LIVER FUNCTION TESTS: Recent Labs    07/18/21 1126  BILITOT 0.5  AST 23  ALT 24  ALKPHOS 81  PROT 8.0  ALBUMIN 4.2    TUMOR MARKERS: No results for input(s): AFPTM, CEA, CA199, CHROMGRNA in the last 8760 hours.  Assessment and Plan:  Ms Lance MorinVestal is very pleasant 75 yo female admitted with GI bleeding, currently hemodynamically stable, with her last 2 H&H unchanged.   She has not had any transfusion on this admission.   I discussed with her the common etiology of lower GI bleeding, the typical natural history and sporadic starting/stopping, the historical role of surgery, and the contemporaneous role of angiogram and embolization.   I did let her know that frequently angiogram is performed and is negative for findings, which precludes any possible embolization in the lower GI tract.  I did let her know that I favor that this would be the case for her at this time, given her vital signs and how good she looks.    After I introduced the idea of angiogram and the concepts of embolization, she did let me know that she is hopeful that this bleed stops by itself.    A formal consent was not obtained for angiogram/embolization at this time given her status and our intention to observe. I did let her know that should anything change, such as drop in blood pressure, increasing heart rate, or other signs of ongoing bleeding, we may need to consider angiogram. She understands.   Plan is to continue to observe.  VIR is available on call should there be any changes. We will follow along and reassess in the am.    Thank you for this interesting consult.  I greatly enjoyed meeting Melissa [name] and look forward to participating in their care.  A copy of this report was sent to the requesting provider on this date.  Electronically Signed: Gilmer MorJaime Ardys Hataway, DO 07/19/2021, 9:37 PM   I spent a total of 55 Mi[place]utes    in face to face in clinical consultation, greater than 50% of which was counseling/coordinating care for lower GI bleeding, possible angiogram, possible embolization.

## 2021-07-20 DIAGNOSIS — E118 Type 2 diabetes mellitus with unspecified complications: Secondary | ICD-10-CM

## 2021-07-20 LAB — CBC
HCT: 24.1 % — ABNORMAL LOW (ref 36.0–46.0)
HCT: 23.4 % — ABNORMAL LOW (ref 36.0–46.0)
HCT: 23.6 % — ABNORMAL LOW (ref 36.0–46.0)
Hemoglobin: 7.7 g/dL — ABNORMAL LOW (ref 12.0–15.0)
Hemoglobin: 7.5 g/dL — ABNORMAL LOW (ref 12.0–15.0)
Hemoglobin: 7.5 g/dL — ABNORMAL LOW (ref 12.0–15.0)
MCH: 31.6 pg (ref 26.0–34.0)
MCH: 31.8 pg (ref 26.0–34.0)
MCH: 31.5 pg (ref 26.0–34.0)
MCHC: 32 g/dL (ref 30.0–36.0)
MCHC: 32.1 g/dL (ref 30.0–36.0)
MCHC: 31.8 g/dL (ref 30.0–36.0)
MCV: 98.8 fL (ref 80.0–100.0)
MCV: 99.2 fL (ref 80.0–100.0)
MCV: 99.2 fL (ref 80.0–100.0)
Platelets: 219 10*3/uL (ref 150–400)
Platelets: 227 10*3/uL (ref 150–400)
Platelets: 263 10*3/uL (ref 150–400)
RBC: 2.44 MIL/uL — ABNORMAL LOW (ref 3.87–5.11)
RBC: 2.36 MIL/uL — ABNORMAL LOW (ref 3.87–5.11)
RBC: 2.38 MIL/uL — ABNORMAL LOW (ref 3.87–5.11)
RDW: 13.4 % (ref 11.5–15.5)
RDW: 13.7 % (ref 11.5–15.5)
RDW: 13.6 % (ref 11.5–15.5)
WBC: 6.1 10*3/uL (ref 4.0–10.5)
WBC: 6.1 10*3/uL (ref 4.0–10.5)
WBC: 7 10*3/uL (ref 4.0–10.5)
nRBC: 0 % (ref 0.0–0.2)
nRBC: 0 % (ref 0.0–0.2)
nRBC: 0 % (ref 0.0–0.2)

## 2021-07-20 LAB — GLUCOSE, CAPILLARY
Glucose-Capillary: 111 mg/dL — ABNORMAL HIGH (ref 70–99)
Glucose-Capillary: 113 mg/dL — ABNORMAL HIGH (ref 70–99)
Glucose-Capillary: 138 mg/dL — ABNORMAL HIGH (ref 70–99)
Glucose-Capillary: 146 mg/dL — ABNORMAL HIGH (ref 70–99)
Glucose-Capillary: 162 mg/dL — ABNORMAL HIGH (ref 70–99)
Glucose-Capillary: 166 mg/dL — ABNORMAL HIGH (ref 70–99)
Glucose-Capillary: 167 mg/dL — ABNORMAL HIGH (ref 70–99)
Glucose-Capillary: 96 mg/dL (ref 70–99)

## 2021-07-20 NOTE — Progress Notes (Signed)
PROGRESS NOTE    Melissa [name]  [id number] DOB: 1946-06-22 DOA: 07/18/2021 PCP: Josetta Huddle, MD    Brief Narrative:  75 y.o. female with history of prior GI bleed in 2017 and subsequent colonoscopy in 2018 was unremarkable with history of diabetes mellitus hypertension and hyperlipidemia presents to the ER with complaints of having multiple episodes of bleeding per rectum bright blood per rectum.  Denies any abdominal pain vomiting. In the ED, CTA without active bleed seen. Presenting hgb 12.7  Assessment & Plan:   Principal Problem:   Acute GI bleeding Active Problems:   Essential hypertension   Diabetes mellitus with complication (HCC)   Acute blood loss anemia   Acute GI bleed likely diverticular origin given the CT scan that showed diverticulosis Aspirin currently on hold CTA neg for bleed with nuc med pos for bleed at splenic flexure Chart reviewed. IR consulted. Pt declined embolization GI consulted followig Cont to follow serial hgb trends. If continued bleed, would ask IR to see again Passing clots today Recheck CBC in AM History of hypertension  BP stable Continued on PRN hydralazine Given downtrending hgb, would cont to hold home bp meds at this time Diabetes mellitus type 2  Cont with SSI coverage as needed Hold oral hyperglycemic meds while in hospital History of hyperlipidemia. Cholesterol meds currently on hold Would resume when acute blood loss anemia is stabilized/resolved  DVT prophylaxis: SCD's Code Status: Full Family Communication: Pt in room, family at bedside  Status is: Inpatient  Requires inpatient because: severity of illness with down trending hgb that may need blood transfusion or diagnostic workup   Consultants:  GI  Procedures:    Antimicrobials: Anti-infectives (From admission, onward)    None       Subjective: Without abd pain. Passing clots   Objective: Vitals:   07/19/21 0803 07/19/21 1159 07/19/21 2027  07/20/21 0437  BP: (!) 127/55 (!) 148/60 (!) 154/76 (!) 146/61  Pulse: 82 84 92 71  Resp: 18 18 16 16   Temp: 98.6 F (37 C)  97.9 F (36.6 C) 98.8 F (37.1 C)  TempSrc: Oral  Oral Oral  SpO2: 94% 97% 96% 95%  Weight:      Height:        Intake/Output Summary (Last 24 hours) at 07/20/2021 1526 Last data filed at 07/20/2021 [id number] Gross per 24 hour  Intake 945 ml  Output --  Net 945 ml    Filed Weights   07/18/21 1051  Weight: 87.1 kg    Examination: General exam: Conversant, in no acute distress Respiratory system: normal chest rise, clear, no audible wheezing Cardiovascular system: regular rhythm, s1-s2 Gastrointestinal system: Nondistended, nontender, pos BS Central nervous system: No seizures, no tremors Extremities: No cyanosis, no joint deformities Skin: No rashes, no pallor Psychiatry: Affect normal // no auditory hallucinations   Data Reviewed: I have personally reviewed following labs and imaging studies  CBC: Recent Labs  Lab 07/19/21 0540 07/19/21 1443 07/19/21 1857 07/19/21 2231 07/20/21 0505  WBC 8.1 6.5 6.9 7.7 6.1  HGB 9.8* 8.8* 8.9* 8.0* 7.7*  HCT 30.5* 27.5* 28.1* 25.1* 24.1*  MCV 98.1 98.6 97.9 97.7 98.8  PLT 269 248 267 226 [id number]    Basic Metabolic Panel: Recent Labs  Lab 07/18/21 1126  NA 138  K 4.4  CL 105  CO2 25  GLUCOSE 199*  BUN 23  CREATININE 0.79  CALCIUM 9.7    GFR: Estimated Creatinine Clearance: 61.6 mL/min (by C-G formula based on SCr  of 0.79 mg/dL). Liver Function Tests: Recent Labs  Lab 07/18/21 1126  AST 23  ALT 24  ALKPHOS 81  BILITOT 0.5  PROT 8.0  ALBUMIN 4.2    No results for input(s): LIPASE, AMYLASE in the last 168 hours. No results for input(s): AMMONIA in the last 168 hours. Coagulation Profile: No results for input(s): INR, PROTIME in the last 168 hours. Cardiac Enzymes: No results for input(s): CKTOTAL, CKMB, CKMBINDEX, TROPONINI in the last 168 hours. BNP (last 3 results) No results for  input(s): PROBNP in the last 8760 hours. HbA1C: No results for input(s): HGBA1C in the last 72 hours. CBG: Recent Labs  Lab 07/20/21 0009 07/20/21 0410 07/20/21 0434 07/20/21 0744 07/20/21 1157  GLUCAP 111* 146* 138* 167* 162*    Lipid Profile: No results for input(s): CHOL, HDL, LDLCALC, TRIG, CHOLHDL, LDLDIRECT in the last 72 hours. Thyroid Function Tests: No results for input(s): TSH, T4TOTAL, FREET4, T3FREE, THYROIDAB in the last 72 hours. Anemia Panel: No results for input(s): VITAMINB12, FOLATE, FERRITIN, TIBC, IRON, RETICCTPCT in the last 72 hours. Sepsis Labs: No results for input(s): PROCALCITON, LATICACIDVEN in the last 168 hours.  Recent Results (from the past 240 hour(s))  Resp Panel by RT-PCR (Flu A&B, Covid) Nasopharyngeal Swab     Status: None   Collection Time: 07/18/21  6:30 PM   Specimen: Nasopharyngeal Swab; Nasopharyngeal(NP) swabs in vial transport medium  Result Value Ref Range Status   SARS Coronavirus 2 by RT PCR NEGATIVE NEGATIVE Final    Comment: (NOTE) SARS-CoV-2 target nucleic acids are NOT DETECTED.  The SARS-CoV-2 RNA is generally detectable in upper respiratory specimens during the acute phase of infection. The lowest concentration of SARS-CoV-2 viral copies this assay can detect is 138 copies/mL. A negative result does not preclude SARS-Cov-2 infection and should not be used as the sole basis for treatment or other patient management decisions. A negative result may occur with  improper specimen collection/handling, submission of specimen other than nasopharyngeal swab, presence of viral mutation(s) within the areas targeted by this assay, and inadequate number of viral copies(<138 copies/mL). A negative result must be combined with clinical observations, patient history, and epidemiological information. The expected result is Negative.  Fact Sheet for Patients:  BloggerCourse.com  Fact Sheet for Healthcare  Providers:  SeriousBroker.it  This test is no t yet approved or cleared by the [place] FDA and  has been authorized for detection and/or diagnosis of SARS-CoV-2 by FDA under an Emergency Use Authorization (EUA). This EUA will remain  in effect (meaning this test can be used) for the duration of the COVID-19 declaration under Section 564(b)(1) of the Act, 21 U.S.C.section 360bbb-3(b)(1), unless the authorization is terminated  or revoked sooner.       Influenza A by PCR NEGATIVE NEGATIVE Final   Influenza B by PCR NEGATIVE NEGATIVE Final    Comment: (NOTE) The Xpert Xpress SARS-CoV-2/FLU/RSV plus assay is intended as an aid in the diagnosis of influenza from Nasopharyngeal swab specimens and should not be used as a sole basis for treatment. Nasal washings and aspirates are unacceptable for Xpert Xpress SARS-CoV-2/FLU/RSV testing.  Fact Sheet for Patients: BloggerCourse.com  Fact Sheet for Healthcare Providers: SeriousBroker.it  This test is not yet approved or cleared by the [place] FDA and has been authorized for detection and/or diagnosis of SARS-CoV-2 by FDA under an Emergency Use Authorization (EUA). This EUA will remain in effect (meaning this test can be used) for the duration of the COVID-19 declaration under  Section 564(b)(1) of the Act, 21 U.S.C. section 360bbb-3(b)(1), unless the authorization is terminated or revoked.  Performed at [facility], [place] [address] [place], [place] [address]       Radiology Studies: NM GI Blood Loss  Result Date: 07/19/2021 CLINICAL DATA:  Lower gastrointestinal hemorrhage EXAM: NUCLEAR MEDICINE GASTROINTESTINAL BLEEDING SCAN TECHNIQUE: Sequential abdominal images were obtained following intravenous administration of Tc-41m labeled red blood cells. RADIOPHARMACEUTICALS:  20.8 mCi Tc-25m pertechnetate in-vitro labeled red cells.  COMPARISON:  CTA 07/18/2021 FINDINGS: There is active gastrointestinal hemorrhage identified at the fifty-seventh amended involving the splenic flexure of the colon. This hemorrhage is episodic in nature but continues through the ensuing our to fill the transverse colon retrograde fashion. IMPRESSION: Active gastrointestinal hemorrhage within the splenic flexure of the colon. Note that the hemorrhages episodic in nature. Electronically Signed   By: Fidela Salisbury M.D.   On: 07/19/2021 19:27   CT ANGIO GI BLEED  Result Date: 07/18/2021 CLINICAL DATA:  Constipation, blood in stool EXAM: CTA ABDOMEN AND PELVIS WITHOUT AND WITH CONTRAST TECHNIQUE: Multidetector CT imaging of the abdomen and pelvis was performed using the standard protocol during bolus administration of intravenous contrast. Multiplanar reconstructed images and MIPs were obtained and reviewed to evaluate the vascular anatomy. CONTRAST:  46mL OMNIPAQUE IOHEXOL 350 MG/ML SOLN COMPARISON:  None. FINDINGS: VASCULAR Aorta: Normal caliber aorta without aneurysm, dissection, vasculitis or significant stenosis. Minimal atherosclerosis. Celiac: Patent without evidence of aneurysm, dissection, vasculitis or significant stenosis. SMA: Patent without evidence of aneurysm, dissection, vasculitis or significant stenosis. Renals: There are 3 left renal arteries. The 2 superior left renal arteries are widely patent without atherosclerosis, aneurysm, or vasculitis. The most inferior left renal artery demonstrates mild atherosclerosis at its origin, with less than 50% stenosis. 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 The main renal artery is unremarkable. Accessory right renal artery supplying portions of the lower pole right kidney are unremarkable without stenosis, aneurysm, or vasculitis. IMA: Patent without evidence of aneurysm, dissection, vasculitis or significant stenosis. Inflow: Patent without evidence of aneurysm, dissection, vasculitis or significant  stenosis. Proximal Outflow: Bilateral common femoral and visualized portions of the superficial and profunda femoral arteries are patent without evidence of aneurysm, dissection, vasculitis or significant stenosis. Veins: No obvious venous abnormality within the limitations of this arterial phase study. Review of the MIP images confirms the above findings. NON-VASCULAR Lower chest: No acute pleural or parenchymal lung disease. Hepatobiliary: Mild diffuse hepatic steatosis. No focal liver abnormality. The gallbladder is unremarkable. Pancreas: Unremarkable. No pancreatic ductal dilatation or surrounding inflammatory changes. Spleen: Normal in size without focal abnormality. Adrenals/Urinary Tract: Adrenal glands are unremarkable. Kidneys are normal, without renal calculi, focal lesion, or hydronephrosis. Bladder is minimally distended. Streak artifact from right hip arthroplasty limits evaluation of the bladder. Stomach/Bowel: No bowel obstruction or ileus. Postsurgical changes are seen from sigmoid colon resection and reanastomosis. There is diffuse colonic diverticulosis without evidence of acute diverticulitis. There is no evidence of intraluminal contrast accumulation to suggest active gastrointestinal hemorrhage. Lymphatic: No pathologic adenopathy within the abdomen or pelvis. Reproductive: Uterus and bilateral adnexa are unremarkable. Other: No free fluid or free gas. There is a Forensic psychologist type umbilical hernia containing the anti mesenteric aspect of the mid transverse colon. No evidence of bowel incarceration or obstruction. Musculoskeletal: No acute or destructive bony lesions. Right hip arthroplasty is unremarkable. Reconstructed images demonstrate no additional findings. IMPRESSION: VASCULAR 1. No evidence of active gastrointestinal hemorrhage. 2.  Aortic Atherosclerosis (ICD10-I70.0). NON-VASCULAR 1. Diffuse colonic diverticulosis  without diverticulitis. 2. No bowel obstruction or ileus. 3. 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 No incarceration or obstruction. 4. Hepatic steatosis. Electronically Signed   By: Randa Ngo M.D.   On: 07/18/2021 17:12    Scheduled Meds:  insulin aspart  0-9 Units Subcutaneous Q4H   Continuous Infusions:     LOS: 1 day   Marylu Lund, MD [facility] Pager On Amion  If 7PM-7AM, please contact night-coverage 07/20/2021, 3:26 PM

## 2021-07-20 NOTE — Progress Notes (Signed)
[facility] Gastroenterology Progress Note  Melissa [name] 75 y.o. 05-04-1946  CC: Lower GI bleed  Subjective: Patient seen and examined at bedside.  She is feeling better.  She is passing blood clots in the stool.  Denies any abdominal pain.  ROS : Negative for nausea and vomiting.   Objective: Vital signs in last 24 hours: Vitals:   07/19/21 2027 07/20/21 0437  BP: (!) 154/76 (!) 146/61  Pulse: 92 71  Resp: 16 16  Temp: 97.9 F (36.6 C) 98.8 F (37.1 C)  SpO2: 96% 95%    Physical Exam:  General:  Alert, cooperative, no distress, appears stated age  Head:  Normocephalic, without obvious abnormality, atraumatic  Eyes:  , EOM's intact,   Lungs:   Clear to auscultation bilaterally, respirations unlabored  Heart:  Regular rate and rhythm, S1, S2 normal  Abdomen:   Soft, non-tender, bowel sounds active all four quadrants,  no masses,   Extremities: Extremities normal, atraumatic, no  edema  Pulses: 2+ and symmetric    Lab Results: Recent Labs    07/18/21 1126  NA 138  K 4.4  CL 105  CO2 25  GLUCOSE 199*  BUN 23  CREATININE 0.79  CALCIUM 9.7   Recent Labs    07/18/21 1126  AST 23  ALT 24  ALKPHOS 81  BILITOT 0.5  PROT 8.0  ALBUMIN 4.2   Recent Labs    07/19/21 2231 07/20/21 0505  WBC 7.7 6.1  HGB 8.0* 7.7*  HCT 25.1* 24.1*  MCV 97.7 98.8  PLT 226 219   No results for input(s): LABPROT, INR in the last 72 hours.    Assessment/Plan: -Lower GI bleed.  GI bleeding scan positive for bleeding from splenic flexure.  Was seen by intervention radiology yesterday. -Acute blood loss anemia -History of partial colon resection in 1990s  Recommendations ------------------------ -Monitor H&H. -Advance diet to full liquid -Case discussed with family at bedside.  Also discussed with hospitalist.  If evidence of ongoing active bleeding, may need IR guided embolization. -GI will follow   Kathi Der MD, FACP 07/20/2021, 10:26 AM  Contact #   [phone]

## 2021-07-20 NOTE — Progress Notes (Signed)
Large bloody stool at 1900  On call aware

## 2021-07-21 ENCOUNTER — Inpatient Hospital Stay (HOSPITAL_COMMUNITY): Payer: Medicare Other

## 2021-07-21 ENCOUNTER — Encounter (HOSPITAL_COMMUNITY): Payer: Self-pay | Admitting: Internal Medicine

## 2021-07-21 DIAGNOSIS — D62 Acute posthemorrhagic anemia: Secondary | ICD-10-CM

## 2021-07-21 LAB — CBC
HCT: 22.7 % — ABNORMAL LOW (ref 36.0–46.0)
HCT: 20.1 % — ABNORMAL LOW (ref 36.0–46.0)
Hemoglobin: 7.2 g/dL — ABNORMAL LOW (ref 12.0–15.0)
Hemoglobin: 6.4 g/dL — CL (ref 12.0–15.0)
MCH: 31.7 pg (ref 26.0–34.0)
MCH: 32 pg (ref 26.0–34.0)
MCHC: 31.7 g/dL (ref 30.0–36.0)
MCHC: 31.8 g/dL (ref 30.0–36.0)
MCV: 100 fL (ref 80.0–100.0)
MCV: 100.5 fL — ABNORMAL HIGH (ref 80.0–100.0)
Platelets: 242 10*3/uL (ref 150–400)
Platelets: 230 10*3/uL (ref 150–400)
RBC: 2.27 MIL/uL — ABNORMAL LOW (ref 3.87–5.11)
RBC: 2 MIL/uL — ABNORMAL LOW (ref 3.87–5.11)
RDW: 13.5 % (ref 11.5–15.5)
RDW: 13.8 % (ref 11.5–15.5)
WBC: 6.8 10*3/uL (ref 4.0–10.5)
WBC: 6.4 10*3/uL (ref 4.0–10.5)
nRBC: 0 % (ref 0.0–0.2)
nRBC: 0 % (ref 0.0–0.2)

## 2021-07-21 LAB — GLUCOSE, CAPILLARY
Glucose-Capillary: 102 mg/dL — ABNORMAL HIGH (ref 70–99)
Glucose-Capillary: 134 mg/dL — ABNORMAL HIGH (ref 70–99)
Glucose-Capillary: 142 mg/dL — ABNORMAL HIGH (ref 70–99)
Glucose-Capillary: 155 mg/dL — ABNORMAL HIGH (ref 70–99)
Glucose-Capillary: 157 mg/dL — ABNORMAL HIGH (ref 70–99)
Glucose-Capillary: 161 mg/dL — ABNORMAL HIGH (ref 70–99)

## 2021-07-21 LAB — PREPARE RBC (CROSSMATCH)

## 2021-07-21 MED ORDER — SODIUM CHLORIDE 0.9% IV SOLUTION: Freq: Once | INTRAVENOUS | Status: AC

## 2021-07-21 MED ORDER — PEG 3350-KCL-NA BICARB-NACL 420 G PO SOLR
4000.0000 mL | Freq: Once | ORAL | Status: AC
  Administered 2021-07-21: 4000 mL via ORAL

## 2021-07-21 MED ORDER — SODIUM CHLORIDE 0.9 % IV SOLN: INTRAVENOUS | Status: DC

## 2021-07-21 MED ORDER — SODIUM CHLORIDE (PF) 0.9 % IJ SOLN
INTRAMUSCULAR | Status: AC
  Filled 2021-07-21: qty 50

## 2021-07-21 MED ORDER — IOHEXOL 350 MG/ML SOLN
80.0000 mL | Freq: Once | INTRAVENOUS | Status: AC | PRN
  Administered 2021-07-21: 100 mL via INTRAVENOUS

## 2021-07-21 NOTE — Progress Notes (Signed)
Received a critical lab result from lab: Hgb noted to be 6.4. MD paged and charge nurse made aware.

## 2021-07-21 NOTE — Progress Notes (Signed)
Total of 3 bloody stools throughout night

## 2021-07-21 NOTE — Progress Notes (Signed)
[facility] Gastroenterology Progress Note  Melissa [name] 75 y.o. May 22, 1946  CC: Lower GI bleed  Subjective: Patient seen and examined at bedside.  Somewhat anxious.  3 episodes of bloody bowel movements overnight.  ROS : Negative for nausea and vomiting.   Objective: Vital signs in last 24 hours: Vitals:   07/21/21 0411 07/21/21 0718  BP: (!) 131/59 (!) 123/51  Pulse: 80 72  Resp: 20 20  Temp: 98.7 F (37.1 C) 98 F (36.7 C)  SpO2: 96% 95%    Physical Exam:  General:  Alert, cooperative, no distress, appears stated age  Head:  Normocephalic, without obvious abnormality, atraumatic  Eyes:  , EOM's intact,   Lungs:   Clear to auscultation bilaterally, respirations unlabored  Heart:  Regular rate and rhythm, S1, S2 normal  Abdomen:   Soft, non-tender, bowel sounds active all four quadrants,  no masses,   Extremities: Extremities normal, atraumatic, no  edema  Pulses: 2+ and symmetric    Lab Results: No results for input(s): NA, K, CL, CO2, GLUCOSE, BUN, CREATININE, CALCIUM, MG, PHOS in the last 72 hours.  No results for input(s): AST, ALT, ALKPHOS, BILITOT, PROT, ALBUMIN in the last 72 hours.  Recent Labs    07/20/21 2115 07/21/21 0446  WBC 7.0 6.8  HGB 7.5* 7.2*  HCT 23.6* 22.7*  MCV 99.2 100.0  PLT 263 242   No results for input(s): LABPROT, INR in the last 72 hours.    Assessment/Plan: -Lower GI bleed.  GI bleeding scan positive for bleeding from splenic flexure.  Was seen by intervention radiology yesterday. 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  Mild drop in hemoglobin noted. -Dr. Rhona Leavens has discussed case with interventional radiology.  They have recommended repeat CT angiogram.  If repeat CT angio negative, we will plan for colonoscopy tomorrow if there is enough time to do colon prep.  Otherwise we will plan for  colonoscopy on Thursday or Friday  -Continue clear liquid diet for now -Monitor H and H.  Transfuse if hemoglobin less than 7 -Long discussion with family at bedside.  Multiple questions answered. -GI will follow   Kathi Der MD, FACP 07/21/2021, 11:44 AM  Contact #  [phone]

## 2021-07-21 NOTE — Progress Notes (Signed)
PROGRESS NOTE    Melissa [name]  PJA:[id number] DOB: [birth year] DOA: 07/18/2021 PCP: Marden Noble, MD    Brief Narrative:  75 y.o. female with history of prior GI bleed in 2017 and subsequent colonoscopy in 2018 was unremarkable with history of diabetes mellitus hypertension and hyperlipidemia presents to the ER with complaints of having multiple episodes of bleeding per rectum bright blood per rectum.  Denies any abdominal pain vomiting. In the ED, CTA without active bleed seen. Presenting hgb 12.7  Assessment & Plan:   Principal Problem:   Acute GI bleeding Active Problems:   Essential hypertension   Diabetes mellitus with complication (HCC)   Acute blood loss anemia   Acute GI bleed likely diverticular origin given the CT scan that showed diverticulosis Aspirin currently on hold CTA neg for bleed but f/u nuc med pos for bleed at splenic flexure on 07/19/21. IR was consulted but pt declined embolization at that time Hgb cont to trend down with continued bleeding Pt is now agreeable to embolization. Discussed with IR who recommends repeat CTA for bleed, if neg, then plan on colonoscopy GI cont to follow Transfuse for hgb <7 Recheck CBC in AM History of hypertension  BP stable Continued on PRN hydralazine Given downtrending hgb, would cont to hold home bp meds at this time Diabetes mellitus type 2  Cont with SSI coverage as needed Glycemic trends stable Hold oral hyperglycemic meds while in hospital History of hyperlipidemia. Cholesterol meds currently on hold Would resume when acute blood loss anemia is stabilized/resolved  DVT prophylaxis: SCD's Code Status: Full Family Communication: Pt in room, family at bedside  Status is: Inpatient  Requires inpatient because: severity of illness with down trending hgb that may need blood transfusion or diagnostic workup   Consultants:  GI IR  Procedures:    Antimicrobials: Anti-infectives (From admission, onward)     None       Subjective: Denies abd pain, Passing blood per rectum overngiht  Objective: Vitals:   07/20/21 2011 07/21/21 0411 07/21/21 0718 07/21/21 1251  BP: (!) 178/74 (!) 131/59 (!) 123/51 (!) 154/62  Pulse: (!) 106 80 72 93  Resp: 18 20 20 18   Temp:  98.7 F (37.1 C) 98 F (36.7 C) 98 F (36.7 C)  TempSrc:  Oral Oral Oral  SpO2: 96% 96% 95% 97%  Weight:      Height:        Intake/Output Summary (Last 24 hours) at 07/21/2021 1422 Last data filed at 07/20/2021 1836 Gross per 24 hour  Intake 590 ml  Output --  Net 590 ml    Filed Weights   07/18/21 1051  Weight: 87.1 kg    Examination: General exam: Awake, laying in bed, in nad Respiratory system: Normal respiratory effort, no wheezing Cardiovascular system: regular rate, s1, s2 Gastrointestinal system: Soft, nondistended, positive BS Central nervous system: CN2-12 grossly intact, strength intact Extremities: Perfused, no clubbing Skin: Normal skin turgor, no notable skin lesions seen Psychiatry: Mood normal // no visual hallucinations   Data Reviewed: I have personally reviewed following labs and imaging studies  CBC: Recent Labs  Lab 07/19/21 2231 07/20/21 0505 07/20/21 1735 07/20/21 2115 07/21/21 0446  WBC 7.7 6.1 6.1 7.0 6.8  HGB 8.0* 7.7* 7.5* 7.5* 7.2*  HCT 25.1* 24.1* 23.4* 23.6* 22.7*  MCV 97.7 98.8 99.2 99.2 100.0  PLT 226 219 227 263 242    Basic Metabolic Panel: Recent Labs  Lab 07/18/21 1126  NA 138  K 4.4  CL 105  CO2 25  GLUCOSE 199*  BUN 23  CREATININE 0.79  CALCIUM 9.7    GFR: Estimated Creatinine Clearance: 61.6 mL/min (by C-G formula based on SCr of 0.79 mg/dL). Liver Function Tests: Recent Labs  Lab 07/18/21 1126  AST 23  ALT 24  ALKPHOS 81  BILITOT 0.5  PROT 8.0  ALBUMIN 4.2    No results for input(s): LIPASE, AMYLASE in the last 168 hours. No results for input(s): AMMONIA in the last 168 hours. Coagulation Profile: No results for input(s): INR, PROTIME  in the last 168 hours. Cardiac Enzymes: No results for input(s): CKTOTAL, CKMB, CKMBINDEX, TROPONINI in the last 168 hours. BNP (last 3 results) No results for input(s): PROBNP in the last 8760 hours. HbA1C: No results for input(s): HGBA1C in the last 72 hours. CBG: Recent Labs  Lab 07/20/21 1656 07/20/21 2015 07/21/21 0415 07/21/21 0758 07/21/21 1213  GLUCAP 96 166* 157* 161* 155*    Lipid Profile: No results for input(s): CHOL, HDL, LDLCALC, TRIG, CHOLHDL, LDLDIRECT in the last 72 hours. Thyroid Function Tests: No results for input(s): TSH, T4TOTAL, FREET4, T3FREE, THYROIDAB in the last 72 hours. Anemia Panel: No results for input(s): VITAMINB12, FOLATE, FERRITIN, TIBC, IRON, RETICCTPCT in the last 72 hours. Sepsis Labs: No results for input(s): PROCALCITON, LATICACIDVEN in the last 168 hours.  Recent Results (from the past 240 hour(s))  Resp Panel by RT-PCR (Flu A&B, Covid) Nasopharyngeal Swab     Status: None   Collection Time: 07/18/21  6:30 PM   Specimen: Nasopharyngeal Swab; Nasopharyngeal(NP) swabs in vial transport medium  Result Value Ref Range Status   SARS Coronavirus 2 by RT PCR NEGATIVE NEGATIVE Final    Comment: (NOTE) SARS-CoV-2 target nucleic acids are NOT DETECTED.  The SARS-CoV-2 RNA is generally detectable in upper respiratory specimens during the acute phase of infection. The lowest concentration of SARS-CoV-2 viral copies this assay can detect is 138 copies/mL. A negative result does not preclude SARS-Cov-2 infection and should not be used as the sole basis for treatment or other patient management decisions. A negative result may occur with  improper specimen collection/handling, submission of specimen other than nasopharyngeal swab, presence of viral mutation(s) within the areas targeted by this assay, and inadequate number of viral copies(<138 copies/mL). A negative result must be combined with clinical observations, patient history, and  epidemiological information. The expected result is Negative.  Fact Sheet for Patients:  BloggerCourse.com  Fact Sheet for Healthcare Providers:  SeriousBroker.it  This test is no t yet approved or cleared by the [place] FDA and  has been authorized for detection and/or diagnosis of SARS-CoV-2 by FDA under an Emergency Use Authorization (EUA). This EUA will remain  in effect (meaning this test can be used) for the duration of the COVID-19 declaration under Section 564(b)(1) of the Act, 21 U.S.C.section 360bbb-3(b)(1), unless the authorization is terminated  or revoked sooner.       Influenza A by PCR NEGATIVE NEGATIVE Final   Influenza B by PCR NEGATIVE NEGATIVE Final    Comment: (NOTE) The Xpert Xpress SARS-CoV-2/FLU/RSV plus assay is intended as an aid in the diagnosis of influenza from Nasopharyngeal swab specimens and should not be used as a sole basis for treatment. Nasal washings and aspirates are unacceptable for Xpert Xpress SARS-CoV-2/FLU/RSV testing.  Fact Sheet for Patients: BloggerCourse.com  Fact Sheet for Healthcare Providers: SeriousBroker.it  This test is not yet approved or cleared by the [place] FDA and has been authorized for detection  and/or diagnosis of SARS-CoV-2 by FDA under an Emergency Use Authorization (EUA). This EUA will remain in effect (meaning this test can be used) for the duration of the COVID-19 declaration under Section 564(b)(1) of the Act, 21 U.S.C. section 360bbb-3(b)(1), unless the authorization is terminated or revoked.  Performed at [facility], [address], [place], [place] [address]       Radiology Studies: NM GI Blood Loss  Result Date: 07/19/2021 CLINICAL DATA:  Lower gastrointestinal hemorrhage EXAM: NUCLEAR MEDICINE GASTROINTESTINAL BLEEDING SCAN TECHNIQUE: Sequential abdominal images  were obtained following intravenous administration of Tc-66m labeled red blood cells. RADIOPHARMACEUTICALS:  20.8 mCi Tc-89m pertechnetate in-vitro labeled red cells. COMPARISON:  CTA 07/18/2021 FINDINGS: There is active gastrointestinal hemorrhage identified at the fifty-seventh amended involving the splenic flexure of the colon. This hemorrhage is episodic in nature but continues through the ensuing our to fill the transverse colon retrograde fashion. IMPRESSION: Active gastrointestinal hemorrhage within the splenic flexure of the colon. Note that the hemorrhages episodic in nature. Electronically Signed   By: Helyn Numbers M.D.   On: 07/19/2021 19:27    Scheduled Meds:  insulin aspart  0-9 Units Subcutaneous Q4H   sodium chloride (PF)       Continuous Infusions:     LOS: 2 days   Rickey Barbara, MD [facility] Pager On Amion  If 7PM-7AM, please contact night-coverage 07/21/2021, 2:22 PM

## 2021-07-21 NOTE — H&P (View-Only) (Signed)
[facility] Gastroenterology Progress Note  Melissa [name] 75 y.o. May 22, 1946  CC: Lower GI bleed  Subjective: Patient seen and examined at bedside.  Somewhat anxious.  3 episodes of bloody bowel movements overnight.  ROS : Negative for nausea and vomiting.   Objective: Vital signs in last 24 hours: Vitals:   07/21/21 0411 07/21/21 0718  BP: (!) 131/59 (!) 123/51  Pulse: 80 72  Resp: 20 20  Temp: 98.7 F (37.1 C) 98 F (36.7 C)  SpO2: 96% 95%    Physical Exam:  General:  Alert, cooperative, no distress, appears stated age  Head:  Normocephalic, without obvious abnormality, atraumatic  Eyes:  , EOM's intact,   Lungs:   Clear to auscultation bilaterally, respirations unlabored  Heart:  Regular rate and rhythm, S1, S2 normal  Abdomen:   Soft, non-tender, bowel sounds active all four quadrants,  no masses,   Extremities: Extremities normal, atraumatic, no  edema  Pulses: 2+ and symmetric    Lab Results: No results for input(s): NA, K, CL, CO2, GLUCOSE, BUN, CREATININE, CALCIUM, MG, PHOS in the last 72 hours.  No results for input(s): AST, ALT, ALKPHOS, BILITOT, PROT, ALBUMIN in the last 72 hours.  Recent Labs    07/20/21 2115 07/21/21 0446  WBC 7.0 6.8  HGB 7.5* 7.2*  HCT 23.6* 22.7*  MCV 99.2 100.0  PLT 263 242   No results for input(s): LABPROT, INR in the last 72 hours.    Assessment/Plan: -Lower GI bleed.  GI bleeding scan positive for bleeding from splenic flexure.  Was seen by intervention radiology yesterday. 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  Mild drop in hemoglobin noted. -Dr. Rhona Leavens has discussed case with interventional radiology.  They have recommended repeat CT angiogram.  If repeat CT angio negative, we will plan for colonoscopy tomorrow if there is enough time to do colon prep.  Otherwise we will plan for  colonoscopy on Thursday or Friday  -Continue clear liquid diet for now -Monitor H and H.  Transfuse if hemoglobin less than 7 -Long discussion with family at bedside.  Multiple questions answered. -GI will follow   Kathi Der MD, FACP 07/21/2021, 11:44 AM  Contact #  [phone]

## 2021-07-22 ENCOUNTER — Inpatient Hospital Stay (HOSPITAL_COMMUNITY): Payer: Medicare Other | Admitting: Anesthesiology

## 2021-07-22 ENCOUNTER — Encounter (HOSPITAL_COMMUNITY)
Admission: EM | Disposition: A | Discharge status: Home / Self Care | Payer: Self-pay | Source: Home / Self Care | Attending: Obstetrics and Gynecology

## 2021-07-22 ENCOUNTER — Encounter (HOSPITAL_COMMUNITY): Payer: Self-pay | Admitting: Internal Medicine

## 2021-07-22 HISTORY — PX: COLONOSCOPY WITH PROPOFOL: SHX5780

## 2021-07-22 LAB — GLUCOSE, CAPILLARY
Glucose-Capillary: 113 mg/dL — ABNORMAL HIGH (ref 70–99)
Glucose-Capillary: 129 mg/dL — ABNORMAL HIGH (ref 70–99)
Glucose-Capillary: 140 mg/dL — ABNORMAL HIGH (ref 70–99)
Glucose-Capillary: 147 mg/dL — ABNORMAL HIGH (ref 70–99)
Glucose-Capillary: 148 mg/dL — ABNORMAL HIGH (ref 70–99)
Glucose-Capillary: 172 mg/dL — ABNORMAL HIGH (ref 70–99)
Glucose-Capillary: 175 mg/dL — ABNORMAL HIGH (ref 70–99)

## 2021-07-22 LAB — CBC
HCT: 24.4 % — ABNORMAL LOW (ref 36.0–46.0)
Hemoglobin: 8 g/dL — ABNORMAL LOW (ref 12.0–15.0)
MCH: 31.3 pg (ref 26.0–34.0)
MCHC: 32.8 g/dL (ref 30.0–36.0)
MCV: 95.3 fL (ref 80.0–100.0)
Platelets: 227 10*3/uL (ref 150–400)
RBC: 2.56 MIL/uL — ABNORMAL LOW (ref 3.87–5.11)
RDW: 15 % (ref 11.5–15.5)
WBC: 6.9 10*3/uL (ref 4.0–10.5)
nRBC: 0 % (ref 0.0–0.2)

## 2021-07-22 LAB — BPAM RBC
Blood Product Expiration Date: 202212262359
Blood Product Expiration Date: 202212262359
ISSUE DATE / TIME: 202211221726
ISSUE DATE / TIME: 202211222025
Unit Type and Rh: 5100
Unit Type and Rh: 5100

## 2021-07-22 LAB — TYPE AND SCREEN
ABO/RH(D): O POS
Antibody Screen: NEGATIVE
Unit division: 0
Unit division: 0

## 2021-07-22 LAB — PREPARE RBC (CROSSMATCH)

## 2021-07-22 LAB — HEMOGLOBIN AND HEMATOCRIT, BLOOD
HCT: 25.2 % — ABNORMAL LOW (ref 36.0–46.0)
Hemoglobin: 8.3 g/dL — ABNORMAL LOW (ref 12.0–15.0)

## 2021-07-22 LAB — HEMOGLOBIN: Hemoglobin: 7.9 g/dL — ABNORMAL LOW (ref 12.0–15.0)

## 2021-07-22 SURGERY — COLONOSCOPY WITH PROPOFOL
Anesthesia: Monitor Anesthesia Care

## 2021-07-22 MED ORDER — LACTATED RINGERS IV SOLN: INTRAVENOUS | Status: DC

## 2021-07-22 MED ORDER — PROPOFOL 500 MG/50ML IV EMUL
INTRAVENOUS | Status: DC | PRN
  Administered 2021-07-22: 125 ug/kg/min via INTRAVENOUS

## 2021-07-22 MED ORDER — PHENYLEPHRINE HCL (PRESSORS) 10 MG/ML IV SOLN
INTRAVENOUS | Status: DC | PRN
  Administered 2021-07-22: 120 ug via INTRAVENOUS

## 2021-07-22 MED ORDER — ROSUVASTATIN CALCIUM 5 MG PO TABS
5.0000 mg | ORAL_TABLET | Freq: Every day | ORAL | Status: DC
  Administered 2021-07-24 – 2021-07-27 (×4): 5 mg via ORAL
  Filled 2021-07-22 (×5): qty 1

## 2021-07-22 MED ORDER — SODIUM CHLORIDE 0.9 % IV SOLN: INTRAVENOUS | Status: DC

## 2021-07-22 MED ORDER — PANTOPRAZOLE SODIUM 40 MG PO TBEC
40.0000 mg | DELAYED_RELEASE_TABLET | Freq: Every day | ORAL | Status: DC
  Administered 2021-07-22 – 2021-07-28 (×7): 40 mg via ORAL
  Filled 2021-07-22 (×7): qty 1

## 2021-07-22 SURGICAL SUPPLY — 22 items

## 2021-07-22 NOTE — Progress Notes (Signed)
100 mL of loose bright red blood emptied from specimen hat. MD paged and notified.

## 2021-07-22 NOTE — Transfer of Care (Signed)
Immediate Anesthesia Transfer of Care Note  Patient: Melissa [name]  Procedure(s) Performed: COLONOSCOPY WITH PROPOFOL  Patient Location: PACU  Anesthesia Type:MAC  Level of Consciousness: sedated, patient cooperative and responds to stimulation  Airway & Oxygen Therapy: Patient Spontanous Breathing and Patient connected to face mask oxygen  Post-op Assessment: Report given to RN and Post -op Vital signs reviewed and stable  Post vital signs: Reviewed and stable  Last Vitals:  Vitals Value Taken Time  BP 138/102 07/22/21 1028  Temp 36.6 C 07/22/21 1028  Pulse 75 07/22/21 1029  Resp 22 07/22/21 1029  SpO2 100 % 07/22/21 1029  Vitals shown include unvalidated device data.  Last Pain:  Vitals:   07/22/21 1028  TempSrc: Oral  PainSc: 0-No pain      Patients Stated Pain Goal: 2 (01/15/32 5825)  Complications: No notable events documented.

## 2021-07-22 NOTE — Anesthesia Preprocedure Evaluation (Addendum)
Anesthesia Evaluation  Patient identified by MRN, date of birth, ID band Patient awake    Reviewed: Allergy & Precautions, NPO status , Patient's Chart, lab work & pertinent test results  History of Anesthesia Complications Negative for: history of anesthetic complications  Airway Mallampati: III  TM Distance: >3 FB Neck ROM: Full    Dental  (+) Dental Advisory Given   Pulmonary former smoker,    Pulmonary exam normal        Cardiovascular hypertension, Pt. on medications Normal cardiovascular exam     Neuro/Psych PSYCHIATRIC DISORDERS Anxiety Depression negative neurological ROS     GI/Hepatic Neg liver ROS, PUD, GERD  Medicated and Controlled,  Endo/Other  diabetes Obesity   Renal/GU negative Renal ROS     Musculoskeletal  Gout    Abdominal   Peds  Hematology  (+) anemia ,   Anesthesia Other Findings   Reproductive/Obstetrics                            Anesthesia Physical Anesthesia Plan  ASA: 2  Anesthesia Plan: MAC   Post-op Pain Management:    Induction:   PONV Risk Score and Plan: 2 and Propofol infusion and Treatment may vary due to age or medical condition  Airway Management Planned: Natural Airway and Simple Face Mask  Additional Equipment: None  Intra-op Plan:   Post-operative Plan:   Informed Consent: I have reviewed the patients History and Physical, chart, labs and discussed the procedure including the risks, benefits and alternatives for the proposed anesthesia with the patient or authorized representative who has indicated his/her understanding and acceptance.       Plan Discussed with: CRNA and Anesthesiologist  Anesthesia Plan Comments:        Anesthesia Quick Evaluation

## 2021-07-22 NOTE — Brief Op Note (Signed)
07/18/2021 - 07/22/2021  10:29 AM  PATIENT:  Melissa [name]  75 y.o. female  PRE-OPERATIVE DIAGNOSIS:  Rectal bleeding  POST-OPERATIVE DIAGNOSIS:  Diverticulosis, no bleeding found, hemorrhoids  PROCEDURE:  Procedure(s): COLONOSCOPY WITH PROPOFOL (N/A)  SURGEON:  Surgeon(s) and Role:    * Martie Fulgham, MD - Primary  Findings ----------- -Colonoscopy showed red and clotted blood throughout the colon.  Normal terminal ileum.  Multiple diverticula seen without any evidence of active bleeding.  Large volume lavage was performed but I could not active bleeding seen.  Recommendations ----------------------- -Start full liquid diet -Monitor H&H -repeat GI bleeding scan during episode of active bleeding -GI will follow  Kathi Der MD, FACP 07/22/2021, 10:30 AM  Contact #  [phone]

## 2021-07-22 NOTE — Op Note (Signed)
[facility] Patient Name: Melissa [name] Procedure Date: 07/22/2021 MRN: [id number] Attending MD: Kathi Der , MD Date of Birth: 1945-12-04 CSN: [id number] Age: 75 Admit Type: Inpatient Procedure:                Colonoscopy Indications:              Rectal bleeding Providers:                Kathi Der, MD, Fransisca Connors, Priscella Mann, Technician Referring MD:              Medicines:                Sedation Administered by an Anesthesia Professional Complications:            No immediate complications. Estimated Blood Loss:     Estimated blood loss was minimal. Procedure:                Pre-Anesthesia Assessment:                           - Prior to the procedure, a History and Physical                            was performed, and patient medications and                            allergies were reviewed. The patient's tolerance of                            previous anesthesia was also reviewed. The risks                            and benefits of the procedure and the sedation                            options and risks were discussed with the patient.                            All questions were answered, and informed consent                            was obtained. Prior Anticoagulants: The patient has                            taken no previous anticoagulant or antiplatelet                            agents. ASA Grade Assessment: II - A patient with                            mild systemic disease. After reviewing the risks  and benefits, the patient was deemed in                            satisfactory condition to undergo the procedure.                           After obtaining informed consent, the colonoscope                            was passed under direct vision. Throughout the                            procedure, the patient's blood pressure, pulse, and                             oxygen saturations were monitored continuously. The                            [id number]-HQ190L ([id number]) Olympus colonoscope was                            introduced through the anus and advanced to the the                            terminal ileum, with identification of the                            appendiceal orifice and IC valve. The colonoscopy                            was performed without difficulty. The patient                            tolerated the procedure well. The quality of the                            bowel preparation was fair. Scope In: 9:52:34 AM Scope Out: 10:21:42 AM Scope Withdrawal Time: 0 hours 25 minutes 2 seconds  Total Procedure Duration: 0 hours 29 minutes 8 seconds  Findings:      Skin tags were found on perianal exam.      The terminal ileum appeared normal.      Red and clotted blood was found in the entire colon without any evidence       of active bleeding. Large-volume lavage was performed but I could not       identify any active bleeding site.      Multiple small and large-mouthed diverticula were found in the entire       colon.      Internal hemorrhoids were found during retroflexion. The hemorrhoids       were small. Impression:               - Preparation of the colon was fair.                           - Perianal skin tags  found on perianal exam.                           - The examined portion of the ileum was normal.                           - Blood in the entire examined colon.                           - Diverticulosis in the entire examined colon.                           - Internal hemorrhoids.                           - No specimens collected. Moderate Sedation:      Moderate (conscious) sedation was personally administered by an       anesthesia professional. The following parameters were monitored: oxygen       saturation, heart rate, blood pressure, and response to care. Recommendation:           - Return patient to hospital  ward for ongoing care.                           - Full liquid diet.                           - Continue present medications.                           - Do a GI bleeding (tagged RBC) scan if symptoms                            persist. Procedure Code(s):        --- Professional ---                           [phone], Colonoscopy, flexible; diagnostic, including                            collection of specimen(s) by brushing or washing,                            when performed (separate procedure) Diagnosis Code(s):        --- Professional ---                           U27.2, Other hemorrhoids                           K92.2, Gastrointestinal hemorrhage, unspecified                           K64.4, Residual hemorrhoidal skin tags                           K62.5, Hemorrhage of anus and rectum  K57.30, Diverticulosis of large intestine without                            perforation or abscess without bleeding CPT copyright 2019 [employer]. All rights reserved. The codes documented in this report are preliminary and upon coder review may  be revised to meet current compliance requirements. Kathi Der, MD Kathi Der, MD 07/22/2021 10:29:18 AM Number of Addenda: 0

## 2021-07-22 NOTE — Progress Notes (Signed)
PROGRESS NOTE    Melissa [name]  [id number] DOB: Jul 24, 1946 DOA: 07/18/2021 PCP: Josetta Huddle, MD    Brief Narrative:  75 y.o. female with history of prior GI bleed in 2017 and subsequent colonoscopy in 2018 was unremarkable with history of diabetes mellitus hypertension and hyperlipidemia presents to the ER with complaints of having multiple episodes of bleeding per rectum bright blood per rectum.  Denies any abdominal pain vomiting. In the ED, CTA without active bleed seen. Presenting hgb 12.7  Assessment & Plan:   Principal Problem:   Acute GI bleeding Active Problems:   Essential hypertension   Diabetes mellitus with complication (HCC)   Acute blood loss anemia   Acute GI bleed likely diverticular origin given the CT scan that showed diverticulosis Aspirin currently on hold CTA neg for bleed but f/u nuc med pos for bleed at splenic flexure on 07/19/21. IR was consulted but pt declined embolization at that time.  Hgb cont to trend down with continued bleeding Pt then agreeable to embolization. Discussed with IR who recommends repeat CTA for bleed, if neg, then plan on colonoscopy. It was neg so colonoscopy performed today, copious blood in colon, no source identified GI cont to follow Received 2 units 11/22 for hgb drop to 6s. Appropriate increase today to 8s Continues to have bright red BMs. Will repeat cbc. Discussed w/ gi, advise CTM, consider repeat tagged rbc scan in AM Npo for now, will start IVF Will discuss maintaining 2 PIVs w/ nursing History of hypertension  BP stable Continued on PRN hydralazine Given downtrending hgb, would cont to hold home bp meds at this time Diabetes mellitus type 2  Cont with SSI coverage as needed Glycemic trends stable Hold oral hyperglycemic meds while in hospital History of hyperlipidemia. Cholesterol meds currently on hold Would resume when acute blood loss anemia is stabilized/resolved  DVT prophylaxis: SCD's Code Status:  Full Family Communication: husband updated @ bedside 11/23  Status is: Inpatient  Requires inpatient because: severity of illness with down trending hgb that may need blood transfusion or diagnostic workup   Consultants:  GI IR  Procedures:    Antimicrobials: Anti-infectives (From admission, onward)    None       Subjective: Denies abd pain, Passing blood per rectum after colonoscopy. No abd pain. No n/v.  Objective: Vitals:   07/22/21 1028 07/22/21 1030 07/22/21 1040 07/22/21 1326  BP: (!) 138/102 (!) 145/43 (!) 156/58 (!) 136/56  Pulse: 84 89 81 88  Resp: 17 (!) 21 20 16   Temp: 97.8 F (36.6 C)   98 F (36.7 C)  TempSrc: Oral   Oral  SpO2: 100% 100% 98% 95%  Weight:      Height:        Intake/Output Summary (Last 24 hours) at 07/22/2021 1413 Last data filed at 07/22/2021 1014 Gross per 24 hour  Intake 1596.67 ml  Output --  Net 1596.67 ml   Filed Weights   07/18/21 1051  Weight: 87.1 kg    Examination: General exam: Awake, laying in bed, in nad Respiratory system: Normal respiratory effort, no wheezing Cardiovascular system: regular rate, s1, s2 Gastrointestinal system: Soft, nondistended, positive BS Central nervous system: CN2-12 grossly intact, strength intact Extremities: Perfused, no clubbing Skin: Normal skin turgor, no notable skin lesions seen Psychiatry: Mood normal // no visual hallucinations   Data Reviewed: I have personally reviewed following labs and imaging studies  CBC: Recent Labs  Lab 07/20/21 1735 07/20/21 2115 07/21/21 0446 07/21/21 1526 07/22/21  [id number] 07/22/21 1358  WBC 6.1 7.0 6.8 6.4  --  6.9  HGB 7.5* 7.5* 7.2* 6.4* 8.3* 8.0*  HCT 23.4* 23.6* 22.7* 20.1* 25.2* 24.4*  MCV 99.2 99.2 100.0 100.5*  --  95.3  PLT 227 263 242 230  --  [id number]   Basic Metabolic Panel: Recent Labs  Lab 07/18/21 1126  NA 138  K 4.4  CL 105  CO2 25  GLUCOSE 199*  BUN 23  CREATININE 0.79  CALCIUM 9.7   GFR: Estimated Creatinine  Clearance: 61.6 mL/min (by C-G formula based on SCr of 0.79 mg/dL). Liver Function Tests: Recent Labs  Lab 07/18/21 1126  AST 23  ALT 24  ALKPHOS 81  BILITOT 0.5  PROT 8.0  ALBUMIN 4.2   No results for input(s): LIPASE, AMYLASE in the last 168 hours. No results for input(s): AMMONIA in the last 168 hours. Coagulation Profile: No results for input(s): INR, PROTIME in the last 168 hours. Cardiac Enzymes: No results for input(s): CKTOTAL, CKMB, CKMBINDEX, TROPONINI in the last 168 hours. BNP (last 3 results) No results for input(s): PROBNP in the last 8760 hours. HbA1C: No results for input(s): HGBA1C in the last 72 hours. CBG: Recent Labs  Lab 07/21/21 2352 07/22/21 0356 07/22/21 0800 07/22/21 0847 07/22/21 1145  GLUCAP 102* 172* 140* 129* 148*   Lipid Profile: No results for input(s): CHOL, HDL, LDLCALC, TRIG, CHOLHDL, LDLDIRECT in the last 72 hours. Thyroid Function Tests: No results for input(s): TSH, T4TOTAL, FREET4, T3FREE, THYROIDAB in the last 72 hours. Anemia Panel: No results for input(s): VITAMINB12, FOLATE, FERRITIN, TIBC, IRON, RETICCTPCT in the last 72 hours. Sepsis Labs: No results for input(s): PROCALCITON, LATICACIDVEN in the last 168 hours.  Recent Results (from the past 240 hour(s))  Resp Panel by RT-PCR (Flu A&B, Covid) Nasopharyngeal Swab     Status: None   Collection Time: 07/18/21  6:30 PM   Specimen: Nasopharyngeal Swab; Nasopharyngeal(NP) swabs in vial transport medium  Result Value Ref Range Status   SARS Coronavirus 2 by RT PCR NEGATIVE NEGATIVE Final    Comment: (NOTE) SARS-CoV-2 target nucleic acids are NOT DETECTED.  The SARS-CoV-2 RNA is generally detectable in upper respiratory specimens during the acute phase of infection. The lowest concentration of SARS-CoV-2 viral copies this assay can detect is 138 copies/mL. A negative result does not preclude SARS-Cov-2 infection and should not be used as the sole basis for treatment or other  patient management decisions. A negative result may occur with  improper specimen collection/handling, submission of specimen other than nasopharyngeal swab, presence of viral mutation(s) within the areas targeted by this assay, and inadequate number of viral copies(<138 copies/mL). A negative result must be combined with clinical observations, patient history, and epidemiological information. The expected result is Negative.  Fact Sheet for Patients:  EntrepreneurPulse.com.au  Fact Sheet for Healthcare Providers:  IncredibleEmployment.be  This test is no t yet approved or cleared by the [place] FDA and  has been authorized for detection and/or diagnosis of SARS-CoV-2 by FDA under an Emergency Use Authorization (EUA). This EUA will remain  in effect (meaning this test can be used) for the duration of the COVID-19 declaration under Section 564(b)(1) of the Act, 21 U.S.C.section 360bbb-3(b)(1), unless the authorization is terminated  or revoked sooner.       Influenza A by PCR NEGATIVE NEGATIVE Final   Influenza B by PCR NEGATIVE NEGATIVE Final    Comment: (NOTE) The Xpert Xpress SARS-CoV-2/FLU/RSV plus assay is intended as an aid  in the diagnosis of influenza from Nasopharyngeal swab specimens and should not be used as a sole basis for treatment. Nasal washings and aspirates are unacceptable for Xpert Xpress SARS-CoV-2/FLU/RSV testing.  Fact Sheet for Patients: BloggerCourse.com  Fact Sheet for Healthcare Providers: SeriousBroker.it  This test is not yet approved or cleared by the [place] FDA and has been authorized for detection and/or diagnosis of SARS-CoV-2 by FDA under an Emergency Use Authorization (EUA). This EUA will remain in effect (meaning this test can be used) for the duration of the COVID-19 declaration under Section 564(b)(1) of the Act, 21 U.S.C. section  360bbb-3(b)(1), unless the authorization is terminated or revoked.  Performed at [facility], [address] [place], [place] [address]       Radiology Studies: CT ANGIO GI BLEED  Result Date: 07/21/2021 CLINICAL DATA:  Gastrointestinal bleeding. EXAM: CTA ABDOMEN AND PELVIS WITHOUT AND WITH CONTRAST TECHNIQUE: Multidetector CT imaging of the abdomen and pelvis was performed using the standard protocol during bolus administration of intravenous contrast. Multiplanar reconstructed images and MIPs were obtained and reviewed to evaluate the vascular anatomy. CONTRAST:  OMNIPAQUE IOHEXOL 350 MG/ML SOLN COMPARISON:  July 18, 2021. FINDINGS: VASCULAR Aorta: Atherosclerosis of abdominal aorta is noted without aneurysm or dissection. Celiac: Patent without evidence of aneurysm, dissection, vasculitis or significant stenosis. SMA: Patent without evidence of aneurysm, dissection, vasculitis or significant stenosis. Renals: Bilateral renal arteries are patent without evidence of aneurysm, dissection, vasculitis, fibromuscular dysplasia or significant stenosis. IMA: Patent without evidence of aneurysm, dissection, vasculitis or significant stenosis. Inflow: Patent without evidence of aneurysm, dissection, vasculitis or significant stenosis. Proximal Outflow: Bilateral common femoral and visualized portions of the superficial and profunda femoral arteries are patent without evidence of aneurysm, dissection, vasculitis or significant stenosis. Veins: No obvious venous abnormality within the limitations of this arterial phase study. Review of the MIP images confirms the above findings. NON-VASCULAR Lower chest: No acute abnormality. Hepatobiliary: Hepatic steatosis is noted. No gallstones or biliary dilatation is noted. Pancreas: Unremarkable. No pancreatic ductal dilatation or surrounding inflammatory changes. Spleen: Normal in size without focal abnormality. Adrenals/Urinary Tract:  Adrenal glands are unremarkable. Kidneys are normal, without renal calculi, focal lesion, or hydronephrosis. Bladder is unremarkable. Stomach/Bowel: Stomach is unremarkable. Postsurgical changes are seen at the rectosigmoid junction. Diverticulosis is noted throughout the colon. There is no evidence of bowel obstruction or inflammation. Lymphatic: No adenopathy is noted. Reproductive: Uterus and bilateral adnexa are unremarkable. Other: Stable small periumbilical hernia. No abdominopelvic ascites. Musculoskeletal: Status post right hip arthroplasty. No acute osseous abnormality is noted. IMPRESSION: VASCULAR No definite evidence of active gastrointestinal hemorrhage. Aortic Atherosclerosis (ICD10-I70.0). NON-VASCULAR Hepatic steatosis. Colonic diverticulosis is noted without inflammation. No acute abnormality seen in the abdomen or pelvis. Electronically Signed   By: Lupita Raider M.D.   On: 07/21/2021 14:30    Scheduled Meds:  insulin aspart  0-9 Units Subcutaneous Q4H   Continuous Infusions:     LOS: 3 days   Silvano Bilis, MD [facility] Pager On Amion  If 7PM-7AM, please contact night-coverage 07/22/2021, 2:13 PM

## 2021-07-22 NOTE — Progress Notes (Signed)
Completed bowel prep, running clear/red

## 2021-07-22 NOTE — Anesthesia Postprocedure Evaluation (Signed)
Anesthesia Post Note  Patient: Melissa [name]  Procedure(s) Performed: COLONOSCOPY WITH PROPOFOL     Patient location during evaluation: PACU Anesthesia Type: MAC Level of consciousness: awake and alert Pain management: pain level controlled Vital Signs Assessment: post-procedure vital signs reviewed and stable Respiratory status: spontaneous breathing, nonlabored ventilation and respiratory function stable Cardiovascular status: stable and blood pressure returned to baseline Anesthetic complications: no   No notable events documented.  Last Vitals:  Vitals:   07/22/21 1040 07/22/21 1326  BP: (!) 156/58 (!) 136/56  Pulse: 81 88  Resp: 20 16  Temp:  36.7 C  SpO2: 98% 95%    Last Pain:  Vitals:   07/22/21 1326  TempSrc: Oral  PainSc:                  Audry Pili

## 2021-07-22 NOTE — Interval H&P Note (Signed)
History and Physical Interval Note:  07/22/2021 8:47 AM  Melissa [name]  has presented today for surgery, with the diagnosis of Rectal bleeding.  The various methods of treatment have been discussed with the patient and family. After consideration of risks, benefits and other options for treatment, the patient has consented to  Procedure(s): COLONOSCOPY WITH PROPOFOL (N/A) as a surgical intervention.  The patient's history has been reviewed, patient examined, no change in status, stable for surgery.  I have reviewed the patient's chart and labs.  Questions were answered to the patient's satisfaction.     [name]

## 2021-07-23 ENCOUNTER — Inpatient Hospital Stay (HOSPITAL_COMMUNITY): Payer: Medicare Other

## 2021-07-23 HISTORY — PX: IR ANGIOGRAM VISCERAL SELECTIVE: IMG657

## 2021-07-23 HISTORY — PX: IR US GUIDE VASC ACCESS RIGHT: IMG2390

## 2021-07-23 HISTORY — PX: IR EMBO ART  VEN HEMORR LYMPH EXTRAV  INC GUIDE ROADMAPPING: IMG5450

## 2021-07-23 HISTORY — PX: IR ANGIOGRAM SELECTIVE EACH ADDITIONAL VESSEL: IMG667

## 2021-07-23 LAB — CBC
HCT: 20.4 % — ABNORMAL LOW (ref 36.0–46.0)
Hemoglobin: 6.7 g/dL — CL (ref 12.0–15.0)
MCH: 32.1 pg (ref 26.0–34.0)
MCHC: 32.8 g/dL (ref 30.0–36.0)
MCV: 97.6 fL (ref 80.0–100.0)
Platelets: 211 10*3/uL (ref 150–400)
RBC: 2.09 MIL/uL — ABNORMAL LOW (ref 3.87–5.11)
RDW: 15.5 % (ref 11.5–15.5)
WBC: 5.1 10*3/uL (ref 4.0–10.5)
nRBC: 0.4 % — ABNORMAL HIGH (ref 0.0–0.2)

## 2021-07-23 LAB — BASIC METABOLIC PANEL
Anion gap: 6 (ref 5–15)
BUN: 9 mg/dL (ref 8–23)
CO2: 24 mmol/L (ref 22–32)
Calcium: 8.3 mg/dL — ABNORMAL LOW (ref 8.9–10.3)
Chloride: 110 mmol/L (ref 98–111)
Creatinine, Ser: 0.61 mg/dL (ref 0.44–1.00)
GFR, Estimated: >60 mL/min (ref >60)
Glucose, Bld: 152 mg/dL — ABNORMAL HIGH (ref 70–99)
Potassium: 3.7 mmol/L (ref 3.5–5.1)
Sodium: 140 mmol/L (ref 135–145)

## 2021-07-23 LAB — HEMOGLOBIN: Hemoglobin: 8.1 g/dL — ABNORMAL LOW (ref 12.0–15.0)

## 2021-07-23 LAB — PREPARE RBC (CROSSMATCH)

## 2021-07-23 LAB — GLUCOSE, CAPILLARY
Glucose-Capillary: 143 mg/dL — ABNORMAL HIGH (ref 70–99)
Glucose-Capillary: 167 mg/dL — ABNORMAL HIGH (ref 70–99)
Glucose-Capillary: 140 mg/dL — ABNORMAL HIGH (ref 70–99)
Glucose-Capillary: 135 mg/dL — ABNORMAL HIGH (ref 70–99)

## 2021-07-23 MED ORDER — FENTANYL CITRATE (PF) 100 MCG/2ML IJ SOLN
INTRAMUSCULAR | Status: AC | PRN
  Administered 2021-07-23 (×4): 25 ug via INTRAVENOUS

## 2021-07-23 MED ORDER — MIDAZOLAM HCL 2 MG/2ML IJ SOLN
INTRAMUSCULAR | Status: AC | PRN
  Administered 2021-07-23: 1 mg via INTRAVENOUS
  Administered 2021-07-23 (×2): .5 mg via INTRAVENOUS

## 2021-07-23 MED ORDER — MIDAZOLAM HCL 2 MG/2ML IJ SOLN
INTRAMUSCULAR | Status: AC
  Filled 2021-07-23: qty 2

## 2021-07-23 MED ORDER — ALPRAZOLAM 0.25 MG PO TABS
0.1250 mg | ORAL_TABLET | Freq: Once | ORAL | Status: AC
  Administered 2021-07-23: 0.125 mg via ORAL
  Filled 2021-07-23: qty 1

## 2021-07-23 MED ORDER — IOHEXOL 300 MG/ML  SOLN
100.0000 mL | Freq: Once | INTRAMUSCULAR | Status: AC | PRN
  Administered 2021-07-23: 41 mL via INTRA_ARTERIAL

## 2021-07-23 MED ORDER — HYDRALAZINE HCL 20 MG/ML IJ SOLN
INTRAMUSCULAR | Status: AC
  Filled 2021-07-23: qty 1

## 2021-07-23 MED ORDER — LIDOCAINE HCL 1 % IJ SOLN
INTRAMUSCULAR | Status: AC
  Filled 2021-07-23: qty 20

## 2021-07-23 MED ORDER — SODIUM CHLORIDE 0.9% IV SOLUTION: Freq: Once | INTRAVENOUS | Status: AC

## 2021-07-23 MED ORDER — NITROGLYCERIN 1 MG/10 ML FOR IR/CATH LAB: 100.0000 ug | Freq: Once | INTRA_ARTERIAL | Status: DC

## 2021-07-23 MED ORDER — FENTANYL CITRATE (PF) 100 MCG/2ML IJ SOLN
INTRAMUSCULAR | Status: AC
  Filled 2021-07-23: qty 2

## 2021-07-23 MED ORDER — TECHNETIUM TC 99M-LABELED RED BLOOD CELLS IV KIT: 23.3000 | PACK | Freq: Once | INTRAVENOUS | Status: DC

## 2021-07-23 MED ORDER — HYDRALAZINE HCL 20 MG/ML IJ SOLN
INTRAMUSCULAR | Status: AC | PRN
  Administered 2021-07-23: 10 mg via INTRAVENOUS

## 2021-07-23 MED ORDER — NITROGLYCERIN 1 MG/10 ML FOR IR/CATH LAB
INTRA_ARTERIAL | Status: AC | PRN
  Administered 2021-07-23: 100 ug via INTRA_ARTERIAL

## 2021-07-23 MED ORDER — SODIUM CHLORIDE 0.9% IV SOLUTION: Freq: Once | INTRAVENOUS | Status: DC

## 2021-07-23 MED ORDER — IOHEXOL 300 MG/ML  SOLN
100.0000 mL | Freq: Once | INTRAMUSCULAR | Status: AC | PRN
  Administered 2021-07-23: 50 mL via INTRA_ARTERIAL

## 2021-07-23 NOTE — Progress Notes (Signed)
CRITICAL VALUE STICKER  CRITICAL VALUE:hgb 6.7  RECEIVER TR  DATE & TIME NOTIFIED: 11/23 0530  MESSENGER (representative from lab):  MD NOTIFIED:y  TIME OF NOTIFICATION:0533  RESPONSE:  pending

## 2021-07-23 NOTE — Progress Notes (Signed)
150 mL of loose bright red blood emptied from specimen hat. MD paged and notified.

## 2021-07-23 NOTE — Sedation Documentation (Addendum)
Per Dr. Bryn Gulling at the bedside. ASA 2/Airway 2

## 2021-07-23 NOTE — Progress Notes (Addendum)
PROGRESS NOTE    AI BREAULT  [id number] DOB: November 11, 1945 DOA: 07/18/2021 PCP: Josetta Huddle, MD    Brief Narrative:  75 y.o. female with history of prior GI bleed in 2017 and subsequent colonoscopy in 2018 was unremarkable with history of diabetes mellitus hypertension and hyperlipidemia presents to the ER with complaints of having multiple episodes of bleeding per rectum bright blood per rectum.  Denies any abdominal pain vomiting. In the ED, CTA without active bleed seen. Presenting hgb 12.7  Assessment & Plan:   Principal Problem:   Acute GI bleeding Active Problems:   Essential hypertension   Diabetes mellitus with complication (HCC)   Acute blood loss anemia   Acute GI bleed likely diverticular origin given the CT scan that showed diverticulosis Aspirin currently on hold CTA neg for bleed but f/u nuc med pos for bleed at splenic flexure on 07/19/21. IR was consulted but per prior provider pt declined embolization at that time (husband says embolization was advised by an NP and they don't trust NPs).  Hgb cont to trend down with continued bleeding Pt then agreeable to embolization. Discussed with IR who recommends repeat CTA for bleed, if neg, then plan on colonoscopy. It was neg so colonoscopy performed 11/23, copious blood in colon, no source identified Received 2 units 11/22 for hgb drop to 6s. Continued to have bloody BMs on 11/23 after colonoscopy but hgb stable at 8. Overnight no hematochezia but it has returned this morning. Hgb 6.7, transfused one unit. GI has requested an additional 2 units which I have ordered Tagged rbc scan today shows bleeding @ splenic flexure. Spoke w/ Mir of IR, will plan on angiography and possible intervention tomorrow. He advised 1 unit plts today as well as check INR. Would intervene today if becomes hemodynamically unstable. Will make NPO after midnight. Monitor hgb q6 Monitor vitals q4 Maintain 2 PIVs w/ nursing Of note patient and  husband are very upset, they report they do not have confidence in this hospital or in the doctors here. I offered exploring transfer but they declined History of hypertension  BP stable Continued on PRN hydralazine Given downtrending hgb, would cont to hold home bp meds at this time Diabetes mellitus type 2  Cont with SSI coverage as needed Glycemic trends stable Hold oral hyperglycemic meds while in hospital History of hyperlipidemia. Cholesterol meds currently on hold Would resume when acute blood loss anemia is stabilized/resolved  DVT prophylaxis: SCD's Code Status: Full Family Communication: husband updated @ bedside 11/24  Status is: Inpatient  Requires inpatient because: severity of illness    Consultants:  GI IR  Procedures:    Antimicrobials: Anti-infectives (From admission, onward)    None       Subjective: Denies abd pain, no hematochezia overnight but several episodes this morning  Objective: Vitals:   07/22/21 2012 07/23/21 0439 07/23/21 0633 07/23/21 0652  BP: 137/79 (!) 132/55 (!) 159/72 (!) 157/62  Pulse: 98 82 100 92  Resp: 18 18 18    Temp: 98 F (36.7 C) 98.6 F (37 C) (!) 97.4 F (36.3 C) 98.1 F (36.7 C)  TempSrc: Oral Oral Oral Oral  SpO2: 98% 97% 94% 98%  Weight:      Height:        Intake/Output Summary (Last 24 hours) at 07/23/2021 1017 Last data filed at 07/22/2021 1630 Gross per 24 hour  Intake 45 ml  Output --  Net 45 ml   Filed Weights   07/18/21 1051  Weight:  87.1 kg    Examination: General exam: Awake, laying in bed, in nad Respiratory system: Normal respiratory effort, no wheezing Cardiovascular system: regular rate, s1, s2 Gastrointestinal system: Soft, nondistended, positive BS Central nervous system: CN2-12 grossly intact, strength intact Extremities: Perfused, no clubbing Skin: Normal skin turgor, no notable skin lesions seen Psychiatry: Mood normal // no visual hallucinations   Data Reviewed: I have  personally reviewed following labs and imaging studies  CBC: Recent Labs  Lab 07/20/21 2115 07/21/21 0446 07/21/21 1526 07/22/21 0638 07/22/21 1358 07/22/21 1913 07/23/21 0511  WBC 7.0 6.8 6.4  --  6.9  --  5.1  HGB 7.5* 7.2* 6.4* 8.3* 8.0* 7.9* 6.7*  HCT 23.6* 22.7* 20.1* 25.2* 24.4*  --  20.4*  MCV 99.2 100.0 100.5*  --  95.3  --  97.6  PLT 263 242 230  --  227  --  [id number]   Basic Metabolic Panel: Recent Labs  Lab 07/18/21 1126 07/23/21 0511  NA 138 140  K 4.4 3.7  CL 105 110  CO2 25 24  GLUCOSE 199* 152*  BUN 23 9  CREATININE 0.79 0.61  CALCIUM 9.7 8.3*   GFR: Estimated Creatinine Clearance: 61.6 mL/min (by C-G formula based on SCr of 0.61 mg/dL). Liver Function Tests: Recent Labs  Lab 07/18/21 1126  AST 23  ALT 24  ALKPHOS 81  BILITOT 0.5  PROT 8.0  ALBUMIN 4.2   No results for input(s): LIPASE, AMYLASE in the last 168 hours. No results for input(s): AMMONIA in the last 168 hours. Coagulation Profile: No results for input(s): INR, PROTIME in the last 168 hours. Cardiac Enzymes: No results for input(s): CKTOTAL, CKMB, CKMBINDEX, TROPONINI in the last 168 hours. BNP (last 3 results) No results for input(s): PROBNP in the last 8760 hours. HbA1C: No results for input(s): HGBA1C in the last 72 hours. CBG: Recent Labs  Lab 07/22/21 1550 07/22/21 2007 07/22/21 2333 07/23/21 0436 07/23/21 0745  GLUCAP 175* 147* 113* 143* 167*   Lipid Profile: No results for input(s): CHOL, HDL, LDLCALC, TRIG, CHOLHDL, LDLDIRECT in the last 72 hours. Thyroid Function Tests: No results for input(s): TSH, T4TOTAL, FREET4, T3FREE, THYROIDAB in the last 72 hours. Anemia Panel: No results for input(s): VITAMINB12, FOLATE, FERRITIN, TIBC, IRON, RETICCTPCT in the last 72 hours. Sepsis Labs: No results for input(s): PROCALCITON, LATICACIDVEN in the last 168 hours.  Recent Results (from the past 240 hour(s))  Resp Panel by RT-PCR (Flu A&B, Covid) Nasopharyngeal Swab      Status: None   Collection Time: 07/18/21  6:30 PM   Specimen: Nasopharyngeal Swab; Nasopharyngeal(NP) swabs in vial transport medium  Result Value Ref Range Status   SARS Coronavirus 2 by RT PCR NEGATIVE NEGATIVE Final    Comment: (NOTE) SARS-CoV-2 target nucleic acids are NOT DETECTED.  The SARS-CoV-2 RNA is generally detectable in upper respiratory specimens during the acute phase of infection. The lowest concentration of SARS-CoV-2 viral copies this assay can detect is 138 copies/mL. A negative result does not preclude SARS-Cov-2 infection and should not be used as the sole basis for treatment or other patient management decisions. A negative result may occur with  improper specimen collection/handling, submission of specimen other than nasopharyngeal swab, presence of viral mutation(s) within the areas targeted by this assay, and inadequate number of viral copies(<138 copies/mL). A negative result must be combined with clinical observations, patient history, and epidemiological information. The expected result is Negative.  Fact Sheet for Patients:  EntrepreneurPulse.com.au  Fact Sheet for  Healthcare Providers:  SeriousBroker.it  This test is no t yet approved or cleared by the [place] and  has been authorized for detection and/or diagnosis of SARS-CoV-2 by FDA under an Emergency Use Authorization (EUA). This EUA will remain  in effect (meaning this test can be used) for the duration of the COVID-19 declaration under Section 564(b)(1) of the Act, 21 U.S.C.section 360bbb-3(b)(1), unless the authorization is terminated  or revoked sooner.       Influenza A by PCR NEGATIVE NEGATIVE Final   Influenza B by PCR NEGATIVE NEGATIVE Final    Comment: (NOTE) The Xpert Xpress SARS-CoV-2/FLU/RSV plus assay is intended as an aid in the diagnosis of influenza from Nasopharyngeal swab specimens and should not be used as a sole basis  for treatment. Nasal washings and aspirates are unacceptable for Xpert Xpress SARS-CoV-2/FLU/RSV testing.  Fact Sheet for Patients: BloggerCourse.com  Fact Sheet for Healthcare Providers: SeriousBroker.it  This test is not yet approved or cleared by the [place] FDA and has been authorized for detection and/or diagnosis of SARS-CoV-2 by FDA under an Emergency Use Authorization (EUA). This EUA will remain in effect (meaning this test can be used) for the duration of the COVID-19 declaration under Section 564(b)(1) of the Act, 21 U.S.C. section 360bbb-3(b)(1), unless the authorization is terminated or revoked.  Performed at [facility], [address] [place], [place] [address]       Radiology Studies: CT ANGIO GI BLEED  Result Date: 07/21/2021 CLINICAL DATA:  Gastrointestinal bleeding. EXAM: CTA ABDOMEN AND PELVIS WITHOUT AND WITH CONTRAST TECHNIQUE: Multidetector CT imaging of the abdomen and pelvis was performed using the standard protocol during bolus administration of intravenous contrast. Multiplanar reconstructed images and MIPs were obtained and reviewed to evaluate the vascular anatomy. CONTRAST:  OMNIPAQUE IOHEXOL 350 MG/ML SOLN COMPARISON:  July 18, 2021. FINDINGS: VASCULAR Aorta: Atherosclerosis of abdominal aorta is noted without aneurysm or dissection. Celiac: Patent without evidence of aneurysm, dissection, vasculitis or significant stenosis. SMA: Patent without evidence of aneurysm, dissection, vasculitis or significant stenosis. Renals: Bilateral renal arteries are patent without evidence of aneurysm, dissection, vasculitis, fibromuscular dysplasia or significant stenosis. IMA: Patent without evidence of aneurysm, dissection, vasculitis or significant stenosis. Inflow: Patent without evidence of aneurysm, dissection, vasculitis or significant stenosis. Proximal Outflow: Bilateral common  femoral and visualized portions of the superficial and profunda femoral arteries are patent without evidence of aneurysm, dissection, vasculitis or significant stenosis. Veins: No obvious venous abnormality within the limitations of this arterial phase study. Review of the MIP images confirms the above findings. NON-VASCULAR Lower chest: No acute abnormality. Hepatobiliary: Hepatic steatosis is noted. No gallstones or biliary dilatation is noted. Pancreas: Unremarkable. No pancreatic ductal dilatation or surrounding inflammatory changes. Spleen: Normal in size without focal abnormality. Adrenals/Urinary Tract: Adrenal glands are unremarkable. Kidneys are normal, without renal calculi, focal lesion, or hydronephrosis. Bladder is unremarkable. Stomach/Bowel: Stomach is unremarkable. Postsurgical changes are seen at the rectosigmoid junction. Diverticulosis is noted throughout the colon. There is no evidence of bowel obstruction or inflammation. Lymphatic: No adenopathy is noted. Reproductive: Uterus and bilateral adnexa are unremarkable. Other: Stable small periumbilical hernia. No abdominopelvic ascites. Musculoskeletal: Status post right hip arthroplasty. No acute osseous abnormality is noted. IMPRESSION: VASCULAR No definite evidence of active gastrointestinal hemorrhage. Aortic Atherosclerosis (ICD10-I70.0). NON-VASCULAR Hepatic steatosis. Colonic diverticulosis is noted without inflammation. No acute abnormality seen in the abdomen or pelvis. Electronically Signed   By: Lupita Raider M.D.   On: 07/21/2021 14:30  Scheduled Meds:  sodium chloride   Intravenous Once   insulin aspart  0-9 Units Subcutaneous Q4H   pantoprazole  40 mg Oral Daily   rosuvastatin  5 mg Oral QHS   Continuous Infusions:  sodium chloride 100 mL/hr at 07/23/21 0021      LOS: 4 days   Desma Maxim, MD [facility] Pager On Amion  If 7PM-7AM, please contact night-coverage 07/23/2021, 10:17 AM

## 2021-07-23 NOTE — Sedation Documentation (Signed)
6 Fr angioseal deployed right groin ?

## 2021-07-23 NOTE — Procedures (Signed)
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  Complications: None  EBL: < 10 mL  Acquanetta Belling, MD [phone]

## 2021-07-23 NOTE — Progress Notes (Signed)
Pt left unit for angiography at 1953.  Pt assessment not completed. At bedside shift report, pt was on RA with no S&S of respiratory distress. No complaints of pain or discomfort.

## 2021-07-23 NOTE — Progress Notes (Signed)
[name]  Melissa [name] 75 y.o. [birth year]  CC: Lower GI bleed  Subjective: Patient seen and examined at bedside.  Continues to have multiple episodes of rectal bleeding.  Patient and family are upset because of ongoing bleeding.  ROS : Negative for nausea and vomiting.   Objective: Vital signs in last 24 hours: Vitals:   07/23/21 1019 07/23/21 1050  BP: (!) 179/67 110/73  Pulse: 94 92  Resp: 20   Temp: 98.4 F (36.9 C)   SpO2: 96%     Physical Exam:  General:  Alert, cooperative, no distress, appears stated age  Head:  Normocephalic, without obvious abnormality, atraumatic  Eyes:  , EOM's intact,   Lungs:   Clear to auscultation bilaterally, respirations unlabored  Heart:  Regular rate and rhythm, S1, S2 normal  Abdomen:   Soft, non-tender, bowel sounds active all four quadrants,  no masses,   Extremities: Extremities normal, atraumatic, no  edema  Pulses: 2+ and symmetric    Lab Results: Recent Labs    07/23/21 0511  NA 140  K 3.7  CL 110  CO2 24  GLUCOSE 152*  BUN 9  CREATININE 0.61  CALCIUM 8.3*    No results for input(s): AST, ALT, ALKPHOS, BILITOT, PROT, ALBUMIN in the last 72 hours.  Recent Labs    07/22/21 1358 07/22/21 1913 07/23/21 0511  WBC 6.9  --  5.1  HGB 8.0* 7.9* 6.7*  HCT 24.4*  --  20.4*  MCV 95.3  --  97.6  PLT 227  --  211   No results for input(s): LABPROT, INR in the last 72 hours.    Assessment/Plan: -Lower GI bleed.  Likely diverticular.  CT Angio 11/19 -negative for active bleeding  GI bleeding scan 11/20 positive for bleeding from splenic flexure.  Repeat CT Angio - 11/22 -negative for active bleeding Colonoscopy 11/23 -blood throughout the colon without evidence of active bleeding.   -Acute blood loss anemia -History of partial colon resection in 1990s  Recommendations ------------------------ -Difficult situation because of ongoing bleeding but CT Angio X 2 and colonoscopy did not  reveal any active bleeding source. -Patient and family understandably upset. -Getting another bleeding scan today.  If bleeding scan positive, recommend to call IR for angiogram. -if bleeding scan negative, we will consider another colonoscopy tomorrow.  More than 35-minute spent in patient encounter with more than 50% time spent in face-to-face consultation.  -Discussed with hospitalist and RN.   Kathi Der MD, FACP 07/23/2021, 11:44 AM  Contact #  [phone]

## 2021-07-24 LAB — GLUCOSE, CAPILLARY
Glucose-Capillary: 150 mg/dL — ABNORMAL HIGH (ref 70–99)
Glucose-Capillary: 160 mg/dL — ABNORMAL HIGH (ref 70–99)
Glucose-Capillary: 136 mg/dL — ABNORMAL HIGH (ref 70–99)
Glucose-Capillary: 129 mg/dL — ABNORMAL HIGH (ref 70–99)
Glucose-Capillary: 115 mg/dL — ABNORMAL HIGH (ref 70–99)
Glucose-Capillary: 143 mg/dL — ABNORMAL HIGH (ref 70–99)

## 2021-07-24 LAB — BASIC METABOLIC PANEL
Anion gap: 8 (ref 5–15)
BUN: 8 mg/dL (ref 8–23)
CO2: 22 mmol/L (ref 22–32)
Calcium: 8.2 mg/dL — ABNORMAL LOW (ref 8.9–10.3)
Chloride: 108 mmol/L (ref 98–111)
Creatinine, Ser: 0.66 mg/dL (ref 0.44–1.00)
GFR, Estimated: >60 mL/min (ref >60)
Glucose, Bld: 150 mg/dL — ABNORMAL HIGH (ref 70–99)
Potassium: 3.4 mmol/L — ABNORMAL LOW (ref 3.5–5.1)
Sodium: 138 mmol/L (ref 135–145)

## 2021-07-24 LAB — CBC
HCT: 24.4 % — ABNORMAL LOW (ref 36.0–46.0)
Hemoglobin: 7.8 g/dL — ABNORMAL LOW (ref 12.0–15.0)
MCH: 31.2 pg (ref 26.0–34.0)
MCHC: 32 g/dL (ref 30.0–36.0)
MCV: 97.6 fL (ref 80.0–100.0)
Platelets: 194 10*3/uL (ref 150–400)
RBC: 2.5 MIL/uL — ABNORMAL LOW (ref 3.87–5.11)
RDW: 15.8 % — ABNORMAL HIGH (ref 11.5–15.5)
WBC: 6.4 10*3/uL (ref 4.0–10.5)
nRBC: 0 % (ref 0.0–0.2)

## 2021-07-24 LAB — HEMOGLOBIN
Hemoglobin: 7.7 g/dL — ABNORMAL LOW (ref 12.0–15.0)
Hemoglobin: 8.5 g/dL — ABNORMAL LOW (ref 12.0–15.0)
Hemoglobin: 8 g/dL — ABNORMAL LOW (ref 12.0–15.0)
Hemoglobin: 7.5 g/dL — ABNORMAL LOW (ref 12.0–15.0)

## 2021-07-24 LAB — PROTIME-INR
INR: 1.1 (ref 0.8–1.2)
Prothrombin Time: 14.2 seconds (ref 11.4–15.2)

## 2021-07-24 LAB — LACTIC ACID, PLASMA: Lactic Acid, Venous: 0.9 mmol/L (ref 0.5–1.9)

## 2021-07-24 LAB — MAGNESIUM: Magnesium: 1.6 mg/dL — ABNORMAL LOW (ref 1.7–2.4)

## 2021-07-24 MED ORDER — POTASSIUM CHLORIDE IN NACL 20-0.9 MEQ/L-% IV SOLN
INTRAVENOUS | Status: DC
  Filled 2021-07-24 (×4): qty 1000

## 2021-07-24 MED ORDER — LIP MEDEX EX OINT
TOPICAL_OINTMENT | CUTANEOUS | Status: DC | PRN
  Filled 2021-07-24: qty 7

## 2021-07-24 MED ORDER — MORPHINE SULFATE (PF) 2 MG/ML IV SOLN
1.0000 mg | Freq: Once | INTRAVENOUS | Status: AC
  Administered 2021-07-24: 1 mg via INTRAVENOUS

## 2021-07-24 MED ORDER — HYDROMORPHONE HCL 1 MG/ML IJ SOLN
1.0000 mg | INTRAMUSCULAR | Status: DC | PRN
  Administered 2021-07-24: 2 mg via INTRAVENOUS
  Filled 2021-07-24: qty 2

## 2021-07-24 MED ORDER — MAGNESIUM SULFATE 2 GM/50ML IV SOLN
2.0000 g | Freq: Once | INTRAVENOUS | Status: AC
  Administered 2021-07-24: 2 g via INTRAVENOUS
  Filled 2021-07-24: qty 50

## 2021-07-24 NOTE — Progress Notes (Signed)
MD Wouk at bedside. Told to run platelets, will be updating order in computer.

## 2021-07-24 NOTE — Progress Notes (Signed)
Referring Physician(s): Brahmbhatt,P  Supervising Physician: Michaelle Birks  Patient Status:  [facility] - In-pt  Chief Complaint: Abdominal pain, lower GI bleeding   Subjective: Pt having some persistent mid lower abdominal discomfort; denies fever/chills, N/V; also still with some bloody stools but latest visit to bathroom only urinated   Allergies: Ciprofloxacin, Amlodipine besylate, Atorvastatin, Metformin hcl er, and Spironolactone  Medications: Prior to Admission medications   Medication Sig Start Date End Date Taking? Authorizing Provider  acetaminophen (TYLENOL) 500 MG tablet Take 500-1,000 mg by mouth every 6 (six) hours as needed for mild pain or headache.   Yes [provider]  Alpha-Lipoic Acid 600 MG CAPS Take 600 mg by mouth in the morning.   Yes [provider]  aspirin EC 81 MG tablet Take 81 mg by mouth daily with supper.   Yes [provider]  B Complex Vitamins (VITAMIN B COMPLEX) TABS Take 1 tablet by mouth in the morning.   Yes [provider]  Biotin 2500 MCG CAPS Take 2,500 mcg by mouth daily at 2 PM.   Yes [provider]  Calcium Carbonate (CALCIUM 500 PO) Take 1,000 mg by mouth every evening.   Yes [provider]  calcium carbonate (TUMS - DOSED IN MG ELEMENTAL CALCIUM) 500 MG chewable tablet Chew 1-2 tablets by mouth at bedtime as needed for indigestion or heartburn.   Yes [provider]  Cholecalciferol (VITAMIN D3) 50 MCG (2000 UT) TABS Take 2,000 Units by mouth daily at 2 PM.   Yes [provider]  Coenzyme Q10 (CO Q10) 100 MG CAPS Take 100 mg by mouth every evening.   Yes [provider]  FIBER PO Take 1 capsule by mouth in the morning and at bedtime.   Yes [provider]  Flaxseed, Linseed, (FLAX SEED OIL) 1000 MG CAPS Take 1,000 mg by mouth every evening.   Yes [provider]  glimepiride (AMARYL) 2 MG tablet Take 2-3 mg by mouth See admin instructions.  Take 3 mg by mouth in the morning with breakfast and 2 mg with supper   Yes [provider]  ibuprofen (ADVIL,MOTRIN) 200 MG tablet Take 200-400 mg by mouth every 6 (six) hours as needed for mild pain, moderate pain or headache.   Yes [provider]  losartan (COZAAR) 100 MG tablet Take 100 mg by mouth every evening.   Yes [provider]  Multiple Vitamin (MULTIVITAMIN WITH MINERALS) TABS tablet Take 1 tablet by mouth daily with breakfast.   Yes [provider]  Omega-3 Fatty Acids (FISH OIL PO) Take 1 capsule by mouth every evening.   Yes [provider]  omeprazole (PRILOSEC OTC) 20 MG tablet Take 20 mg by mouth See admin instructions. Take 20 mg by mouth in the morning as needed for heartburn   Yes [provider]  polyethylene glycol powder (GLYCOLAX/MIRALAX) 17 GM/SCOOP powder Take 17 g by mouth See admin instructions. Mix 17 grams of powder into 4-8 ounces of water and drink in the morning as needed for constipation   Yes [provider]  rosuvastatin (CRESTOR) 5 MG tablet Take 5 mg by mouth at bedtime.   Yes [provider]  TURMERIC PO Take 1 capsule by mouth in the morning.   Yes [provider]  vitamin C (ASCORBIC ACID) 500 MG tablet Take 500 mg by mouth in the morning and at bedtime.   Yes [provider]  magnesium oxide (MAG-OX) 400 MG tablet Take  400 mg by mouth daily. Patient not taking: Reported on 07/18/2021    [provider]  spironolactone (ALDACTONE) 25 MG tablet Take 25 mg by mouth daily. Patient not taking: Reported on 07/18/2021    [provider]     Vital Signs: BP (!) 151/52 (BP Location: Left Arm)   Pulse 82   Temp 98.1 F (36.7 C) (Oral)   Resp 15   Ht 5\' 3"  (1.6 m)   Wt 192 lb (87.1 kg)   SpO2 92%   BMI 34.01 kg/m   Physical Exam awake/alert; access site rt CFA soft, clean/dry, NT, no discrete hematoma; intact distal pulses  Imaging: NM GI Blood  Loss  Result Date: 07/23/2021 CLINICAL DATA:  75 year old female with GI bleeding. EXAM: NUCLEAR MEDICINE GASTROINTESTINAL BLEEDING SCAN TECHNIQUE: Sequential abdominal images were obtained following intravenous administration of Tc-14m labeled red blood cells. RADIOPHARMACEUTICALS:  23.3 mCi Tc-37m pertechnetate in-vitro labeled red cells. COMPARISON:  07/21/2021 CTA, 07/19/2021 nuclear medicine GI bleeding study and prior exams FINDINGS: There is abnormal activity within a tubular structure within the mid abdomen compatible with active GI bleeding within the transverse colon, likely in the more distal aspect of the transverse colon/splenic flexure. This abnormal activity slightly progresses towards the end of the study. IMPRESSION: Active colonic GI bleeding with probable bleeding site in the more distal aspect of the transverse colon/splenic flexure. Electronically Signed   By: 07/21/2021 M.D.   On: 07/23/2021 15:13   IR Angiogram Visceral Selective  Result Date: 07/24/2021 INDICATION: 75 year old woman with brisk lower GI bleed presents to IR for mesenteric angiogram and embolization. EXAM: 1. Ultrasound-guided access of right common femoral artery 2. Superior mesenteric angiogram 3. Inferior mesenteric angiogram 4. Left colic angiogram 5. Middle colic angiogram and pseudoaneurysm embolization MEDICATIONS: None ANESTHESIA/SEDATION: Moderate (conscious) sedation was employed during this procedure. A total of Versed 2 mg and Fentanyl 100 mcg was administered intravenously by the radiology nurse. Total intra-service moderate Sedation Time: 143 minutes. The patient's level of consciousness and vital signs were monitored continuously by radiology nursing throughout the procedure under my direct supervision. CONTRAST:  141 mL Omnipaque 300 FLUOROSCOPY TIME:  Fluoroscopy Time: 40 minutes 42 seconds (3726 mGy). COMPLICATIONS: None immediate. PROCEDURE: Informed consent was obtained from the patient following  explanation of the procedure, risks, benefits and alternatives. Specifically, I discussed with the patient that the risks included, but are not limited to bleeding, infection, arterial injury, and bowel infarction requiring surgical resection. The patient understands, agrees and consents for the procedure. All questions were addressed. A time out was performed prior to the initiation of the procedure. Maximal barrier sterile technique utilized including caps, mask, sterile gowns, sterile gloves, large sterile drape, hand hygiene, and chlorhexidine prep. Right groin skin prepped and draped in usual fashion. Ultrasound image documenting patency of the right common femoral artery was obtained and placed in permanent medical record. Sterile ultrasound probe cover and gel utilized throughout the procedure. Utilizing continuous ultrasound guidance, the right common femoral artery was accessed with a 21 gauge needle. 21 gauge needle exchanged for transitional dilator set over 0.018 inch guidewire. Transitional dilator set exchanged for 5 French sheath over 0.035 inch guidewire. Mickelson catheter utilized to select the superior mesenteric artery. Superior mesenteric arteriogram demonstrated replaced right hepatic artery, patent jejunal, right colic, and middle colic branches. 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 No active extravasation was seen. Mickelson catheter was then utilized to select the inferior mesenteric artery.  Angiogram demonstrated patent left colic artery with some flow to the middle colic arcade through the marginal artery of [place]. STC microcatheter was advanced into the left colic and superior rectal arteries and angiograms were performed. No active extravasation was seen. Mickelson catheter was again utilized to select the superior mesenteric artery. SCC microcatheter advanced into the middle colic artery and angiogram was performed. This again showed in  the pseudoaneurysm near the proximal transverse colon. The microcatheter was advanced into the front door arterial branch and embolization was performed with a 3 mm Ruby coil. Post embolization DSA showed no significant flow to the pseudoaneurysm from the front door branch. Microcatheter was then utilized to select the back door feeder of the pseudoaneurysm. There was significant arterial spasm, therefore 100 mcg of nitro was administered slowly into the branch resulting in significant improvement of spasm. The microcatheter was advanced to the level of the pseudoaneurysm and embolization was performed with 3 and 4 mm Ruby coils. Post embolization angiogram showed no significant flow to the pseudoaneurysm. Sheath angiogram showed appropriate puncture of the common femoral artery at the level of the femoral head prosthesis. The groin was re-prepped and draped and arteriotomy was closed with 6 [place] Angio-Seal device. 5 minutes of manual compression applied at the access site for additional hemostasis. IMPRESSION: 1. 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 The pseudoaneurysm was successfully coil embolized. 2. No active extravasation or other vascular abnormalities identified on superior or inferior mesenteric angiogram. Electronically Signed   By: Miachel Roux M.D.   On: 07/24/2021 11:32   IR Angiogram Visceral Selective  Result Date: 07/24/2021 INDICATION: 75 year old woman with brisk lower GI bleed presents to IR for mesenteric angiogram and embolization. EXAM: 1. Ultrasound-guided access of right common femoral artery 2. Superior mesenteric angiogram 3. Inferior mesenteric angiogram 4. Left colic angiogram 5. Middle colic angiogram and pseudoaneurysm embolization MEDICATIONS: None ANESTHESIA/SEDATION: Moderate (conscious) sedation was employed during this procedure. A total of Versed 2 mg and Fentanyl 100  mcg was administered intravenously by the radiology nurse. Total intra-service moderate Sedation Time: 143 minutes. The patient's level of consciousness and vital signs were monitored continuously by radiology nursing throughout the procedure under my direct supervision. CONTRAST:  141 mL Omnipaque 300 FLUOROSCOPY TIME:  Fluoroscopy Time: 40 minutes 42 seconds (3726 mGy). COMPLICATIONS: None immediate. PROCEDURE: Informed consent was obtained from the patient following explanation of the procedure, risks, benefits and alternatives. Specifically, I discussed with the patient that the risks included, but are not limited to bleeding, infection, arterial injury, and bowel infarction requiring surgical resection. The patient understands, agrees and consents for the procedure. All questions were addressed. A time out was performed prior to the initiation of the procedure. Maximal barrier sterile technique utilized including caps, mask, sterile gowns, sterile gloves, large sterile drape, hand hygiene, and chlorhexidine prep. Right groin skin prepped and draped in usual fashion. Ultrasound image documenting patency of the right common femoral artery was obtained and placed in permanent medical record. Sterile ultrasound probe cover and gel utilized throughout the procedure. Utilizing continuous ultrasound guidance, the right common femoral artery was accessed with a 21 gauge needle. 21 gauge needle exchanged for transitional dilator set over 0.018 inch guidewire. Transitional dilator set exchanged for 5 French sheath over 0.035 inch guidewire. Mickelson catheter utilized to select the superior mesenteric artery. Superior mesenteric arteriogram demonstrated replaced right hepatic artery, patent jejunal, right colic, and middle colic branches. Small pseudoaneurysm  was seen originating from a branch of the middle colic artery. No active extravasation was seen. Mickelson catheter was then utilized to select the inferior  mesenteric artery. Angiogram demonstrated patent left colic artery with some flow to the middle colic arcade through the marginal artery of [place]. STC microcatheter was advanced into the left colic and superior rectal arteries and angiograms were performed. No active extravasation was seen. Mickelson catheter was again utilized to select the superior mesenteric artery. SCC microcatheter advanced into the middle colic artery and angiogram was performed. 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 The microcatheter was advanced into the front door arterial branch and embolization was performed with a 3 mm Ruby coil. Post embolization DSA showed no significant flow to the pseudoaneurysm from the front door branch. Microcatheter was then utilized to select the back door feeder of the pseudoaneurysm. There was significant arterial spasm, therefore 100 mcg of nitro was administered slowly into the branch resulting in significant improvement of spasm. The microcatheter was advanced to the level of the pseudoaneurysm and embolization was performed with 3 and 4 mm Ruby coils. Post embolization angiogram showed no significant flow to the pseudoaneurysm. Sheath angiogram showed appropriate puncture of the common femoral artery at the level of the femoral head prosthesis. The groin was re-prepped and draped and arteriotomy was closed with 6 [place] Angio-Seal device. 5 minutes of manual compression applied at the access site for additional hemostasis. IMPRESSION: 1. 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 The pseudoaneurysm was successfully coil embolized. 2. No active extravasation or other vascular abnormalities identified on superior or inferior mesenteric angiogram. Electronically Signed   By: Miachel Roux M.D.   On: 07/24/2021 11:32   IR Angiogram Selective Each Additional  Vessel  Result Date: 07/24/2021 INDICATION: 75 year old woman with brisk lower GI bleed presents to IR for mesenteric angiogram and embolization. EXAM: 1. Ultrasound-guided access of right common femoral artery 2. Superior mesenteric angiogram 3. Inferior mesenteric angiogram 4. Left colic angiogram 5. Middle colic angiogram and pseudoaneurysm embolization MEDICATIONS: None ANESTHESIA/SEDATION: Moderate (conscious) sedation was employed during this procedure. A total of Versed 2 mg and Fentanyl 100 mcg was administered intravenously by the radiology nurse. Total intra-service moderate Sedation Time: 143 minutes. The patient's level of consciousness and vital signs were monitored continuously by radiology nursing throughout the procedure under my direct supervision. CONTRAST:  141 mL Omnipaque 300 FLUOROSCOPY TIME:  Fluoroscopy Time: 40 minutes 42 seconds (3726 mGy). COMPLICATIONS: None immediate. PROCEDURE: Informed consent was obtained from the patient following explanation of the procedure, risks, benefits and alternatives. Specifically, I discussed with the patient that the risks included, but are not limited to bleeding, infection, arterial injury, and bowel infarction requiring surgical resection. The patient understands, agrees and consents for the procedure. All questions were addressed. A time out was performed prior to the initiation of the procedure. Maximal barrier sterile technique utilized including caps, mask, sterile gowns, sterile gloves, large sterile drape, hand hygiene, and chlorhexidine prep. Right groin skin prepped and draped in usual fashion. Ultrasound image documenting patency of the right common femoral artery was obtained and placed in permanent medical record. Sterile ultrasound probe cover and gel utilized throughout the procedure. Utilizing continuous ultrasound guidance, the right common femoral artery was accessed with a 21 gauge needle. 21 gauge needle exchanged for transitional  dilator set over 0.018 inch guidewire. Transitional dilator set exchanged for 5 French sheath over  0.035 inch guidewire. Mickelson catheter utilized to select the superior mesenteric artery. Superior mesenteric arteriogram demonstrated replaced right hepatic artery, patent jejunal, right colic, and middle colic branches. 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 No active extravasation was seen. Mickelson catheter was then utilized to select the inferior mesenteric artery. Angiogram demonstrated patent left colic artery with some flow to the middle colic arcade through the marginal artery of [place]. STC microcatheter was advanced into the left colic and superior rectal arteries and angiograms were performed. No active extravasation was seen. Mickelson catheter was again utilized to select the superior mesenteric artery. SCC microcatheter advanced into the middle colic artery and angiogram was performed. 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 The microcatheter was advanced into the front door arterial branch and embolization was performed with a 3 mm Ruby coil. Post embolization DSA showed no significant flow to the pseudoaneurysm from the front door branch. Microcatheter was then utilized to select the back door feeder of the pseudoaneurysm. There was significant arterial spasm, therefore 100 mcg of nitro was administered slowly into the branch resulting in significant improvement of spasm. The microcatheter was advanced to the level of the pseudoaneurysm and embolization was performed with 3 and 4 mm Ruby coils. Post embolization angiogram showed no significant flow to the pseudoaneurysm. Sheath angiogram showed appropriate puncture of the common femoral artery at the level of the femoral head prosthesis. The groin was re-prepped and draped and arteriotomy was closed with 6 [place] Angio-Seal device. 5 minutes of manual compression  applied at the access site for additional hemostasis. IMPRESSION: 1. 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 The pseudoaneurysm was successfully coil embolized. 2. No active extravasation or other vascular abnormalities identified on superior or inferior mesenteric angiogram. Electronically Signed   By: Miachel Roux M.D.   On: 07/24/2021 11:32   IR US Guide Vasc Access Right  Result Date: 07/24/2021 INDICATION: 75 year old woman with brisk lower GI bleed presents to IR for mesenteric angiogram and embolization. EXAM: 1. Ultrasound-guided access of right common femoral artery 2. Superior mesenteric angiogram 3. Inferior mesenteric angiogram 4. Left colic angiogram 5. Middle colic angiogram and pseudoaneurysm embolization MEDICATIONS: None ANESTHESIA/SEDATION: Moderate (conscious) sedation was employed during this procedure. A total of Versed 2 mg and Fentanyl 100 mcg was administered intravenously by the radiology nurse. Total intra-service moderate Sedation Time: 143 minutes. The patient's level of consciousness and vital signs were monitored continuously by radiology nursing throughout the procedure under my direct supervision. CONTRAST:  141 mL Omnipaque 300 FLUOROSCOPY TIME:  Fluoroscopy Time: 40 minutes 42 seconds (3726 mGy). COMPLICATIONS: None immediate. PROCEDURE: Informed consent was obtained from the patient following explanation of the procedure, risks, benefits and alternatives. Specifically, I discussed with the patient that the risks included, but are not limited to bleeding, infection, arterial injury, and bowel infarction requiring surgical resection. The patient understands, agrees and consents for the procedure. All questions were addressed. A time out was performed prior to the initiation of the procedure. Maximal barrier sterile technique utilized including caps, mask, sterile gowns,  sterile gloves, large sterile drape, hand hygiene, and chlorhexidine prep. Right groin skin prepped and draped in usual fashion. Ultrasound image documenting patency of the right common femoral artery was obtained and placed in permanent medical record. Sterile ultrasound probe cover and gel utilized throughout the procedure. Utilizing continuous ultrasound guidance, the right common femoral  artery was accessed with a 21 gauge needle. 21 gauge needle exchanged for transitional dilator set over 0.018 inch guidewire. Transitional dilator set exchanged for 5 French sheath over 0.035 inch guidewire. Mickelson catheter utilized to select the superior mesenteric artery. Superior mesenteric arteriogram demonstrated replaced right hepatic artery, patent jejunal, right colic, and middle colic branches. 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 No active extravasation was seen. Mickelson catheter was then utilized to select the inferior mesenteric artery. Angiogram demonstrated patent left colic artery with some flow to the middle colic arcade through the marginal artery of [place]. STC microcatheter was advanced into the left colic and superior rectal arteries and angiograms were performed. No active extravasation was seen. Mickelson catheter was again utilized to select the superior mesenteric artery. SCC microcatheter advanced into the middle colic artery and angiogram was performed. 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 The microcatheter was advanced into the front door arterial branch and embolization was performed with a 3 mm Ruby coil. Post embolization DSA showed no significant flow to the pseudoaneurysm from the front door branch. Microcatheter was then utilized to select the back door feeder of the pseudoaneurysm. There was significant arterial spasm, therefore 100 mcg of nitro was administered slowly into the branch resulting in  significant improvement of spasm. The microcatheter was advanced to the level of the pseudoaneurysm and embolization was performed with 3 and 4 mm Ruby coils. Post embolization angiogram showed no significant flow to the pseudoaneurysm. Sheath angiogram showed appropriate puncture of the common femoral artery at the level of the femoral head prosthesis. The groin was re-prepped and draped and arteriotomy was closed with 6 [place] Angio-Seal device. 5 minutes of manual compression applied at the access site for additional hemostasis. IMPRESSION: 1. 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 The pseudoaneurysm was successfully coil embolized. 2. No active extravasation or other vascular abnormalities identified on superior or inferior mesenteric angiogram. Electronically Signed   By: Miachel Roux M.D.   On: 07/24/2021 11:32   IR EMBO ART  VEN HEMORR LYMPH EXTRAV  INC GUIDE ROADMAPPING  Result Date: 07/24/2021 INDICATION: 75 year old woman with brisk lower GI bleed presents to IR for mesenteric angiogram and embolization. EXAM: 1. Ultrasound-guided access of right common femoral artery 2. Superior mesenteric angiogram 3. Inferior mesenteric angiogram 4. Left colic angiogram 5. Middle colic angiogram and pseudoaneurysm embolization MEDICATIONS: None ANESTHESIA/SEDATION: Moderate (conscious) sedation was employed during this procedure. A total of Versed 2 mg and Fentanyl 100 mcg was administered intravenously by the radiology nurse. Total intra-service moderate Sedation Time: 143 minutes. The patient's level of consciousness and vital signs were monitored continuously by radiology nursing throughout the procedure under my direct supervision. CONTRAST:  141 mL Omnipaque 300 FLUOROSCOPY TIME:  Fluoroscopy Time: 40 minutes 42 seconds (3726 mGy). COMPLICATIONS: None immediate. PROCEDURE: Informed consent was obtained from  the patient following explanation of the procedure, risks, benefits and alternatives. Specifically, I discussed with the patient that the risks included, but are not limited to bleeding, infection, arterial injury, and bowel infarction requiring surgical resection. The patient understands, agrees and consents for the procedure. All questions were addressed. A time out was performed prior to the initiation of the procedure. Maximal barrier sterile technique utilized including caps, mask, sterile gowns, sterile gloves, large sterile drape, hand hygiene, and chlorhexidine prep. Right groin skin prepped and draped in usual fashion. Ultrasound  image documenting patency of the right common femoral artery was obtained and placed in permanent medical record. Sterile ultrasound probe cover and gel utilized throughout the procedure. Utilizing continuous ultrasound guidance, the right common femoral artery was accessed with a 21 gauge needle. 21 gauge needle exchanged for transitional dilator set over 0.018 inch guidewire. Transitional dilator set exchanged for 5 French sheath over 0.035 inch guidewire. Mickelson catheter utilized to select the superior mesenteric artery. Superior mesenteric arteriogram demonstrated replaced right hepatic artery, patent jejunal, right colic, and middle colic branches. 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 No active extravasation was seen. Mickelson catheter was then utilized to select the inferior mesenteric artery. Angiogram demonstrated patent left colic artery with some flow to the middle colic arcade through the marginal artery of [place]. STC microcatheter was advanced into the left colic and superior rectal arteries and angiograms were performed. No active extravasation was seen. Mickelson catheter was again utilized to select the superior mesenteric artery. SCC microcatheter advanced into the middle colic artery and angiogram was performed.  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 The microcatheter was advanced into the front door arterial branch and embolization was performed with a 3 mm Ruby coil. Post embolization DSA showed no significant flow to the pseudoaneurysm from the front door branch. Microcatheter was then utilized to select the back door feeder of the pseudoaneurysm. There was significant arterial spasm, therefore 100 mcg of nitro was administered slowly into the branch resulting in significant improvement of spasm. The microcatheter was advanced to the level of the pseudoaneurysm and embolization was performed with 3 and 4 mm Ruby coils. Post embolization angiogram showed no significant flow to the pseudoaneurysm. Sheath angiogram showed appropriate puncture of the common femoral artery at the level of the femoral head prosthesis. The groin was re-prepped and draped and arteriotomy was closed with 6 [place] Angio-Seal device. 5 minutes of manual compression applied at the access site for additional hemostasis. IMPRESSION: 1. 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 The pseudoaneurysm was successfully coil embolized. 2. No active extravasation or other vascular abnormalities identified on superior or inferior mesenteric angiogram. Electronically Signed   By: Miachel Roux M.D.   On: 07/24/2021 11:32   CT ANGIO GI BLEED  Result Date: 07/21/2021 CLINICAL DATA:  Gastrointestinal bleeding. EXAM: CTA ABDOMEN AND PELVIS WITHOUT AND WITH CONTRAST TECHNIQUE: Multidetector CT imaging of the abdomen and pelvis was performed using the standard protocol during bolus administration of intravenous contrast. Multiplanar reconstructed images and MIPs were obtained and reviewed to evaluate the vascular anatomy. CONTRAST:  172mL OMNIPAQUE IOHEXOL 350 MG/ML SOLN COMPARISON:  July 18, 2021. FINDINGS: VASCULAR Aorta:  Atherosclerosis of abdominal aorta is noted without aneurysm or dissection. Celiac: Patent without evidence of aneurysm, dissection, vasculitis or significant stenosis. SMA: Patent without evidence of aneurysm, dissection, vasculitis or significant stenosis. Renals: Bilateral renal arteries are patent without evidence of aneurysm, dissection, vasculitis, fibromuscular dysplasia or significant stenosis. IMA: Patent without evidence of aneurysm, dissection, vasculitis or significant stenosis. Inflow: Patent without evidence of aneurysm, dissection, vasculitis or significant stenosis. Proximal Outflow: Bilateral common femoral and visualized portions of the superficial and profunda femoral arteries are patent without evidence of aneurysm, dissection, vasculitis or significant stenosis. Veins: No obvious venous abnormality within the limitations of this arterial phase study. Review of the MIP images confirms the above findings. NON-VASCULAR Lower chest: No acute abnormality.  Hepatobiliary: Hepatic steatosis is noted. No gallstones or biliary dilatation is noted. Pancreas: Unremarkable. No pancreatic ductal dilatation or surrounding inflammatory changes. Spleen: Normal in size without focal abnormality. Adrenals/Urinary Tract: Adrenal glands are unremarkable. Kidneys are normal, without renal calculi, focal lesion, or hydronephrosis. Bladder is unremarkable. Stomach/Bowel: Stomach is unremarkable. Postsurgical changes are seen at the rectosigmoid junction. Diverticulosis is noted throughout the colon. There is no evidence of bowel obstruction or inflammation. Lymphatic: No adenopathy is noted. Reproductive: Uterus and bilateral adnexa are unremarkable. Other: Stable small periumbilical hernia. No abdominopelvic ascites. Musculoskeletal: Status post right hip arthroplasty. No acute osseous abnormality is noted. IMPRESSION: VASCULAR No definite evidence of active gastrointestinal hemorrhage. Aortic Atherosclerosis  (ICD10-I70.0). NON-VASCULAR Hepatic steatosis. Colonic diverticulosis is noted without inflammation. No acute abnormality seen in the abdomen or pelvis. Electronically Signed   By: Marijo Conception M.D.   On: 07/21/2021 14:30    Labs:  CBC: Recent Labs    07/21/21 1526 07/22/21 0638 07/22/21 1358 07/22/21 1913 07/23/21 0511 07/23/21 1140 07/24/21 0032 07/24/21 0618 07/24/21 1243  WBC 6.4  --  6.9  --  5.1  --  6.4  --   --   HGB 6.4* 8.3* 8.0*   < > 6.7* 8.1* 7.7*  7.8* 8.5* 8.0*  HCT 20.1* 25.2* 24.4*  --  20.4*  --  24.4*  --   --   PLT 230  --  227  --  211  --  194  --   --    < > = values in this interval not displayed.    COAGS: Recent Labs    07/24/21 0032  INR 1.1    BMP: Recent Labs    07/18/21 1126 07/23/21 0511 07/24/21 0032  NA 138 140 138  K 4.4 3.7 3.4*  CL 105 110 108  CO2 25 24 22   GLUCOSE 199* 152* 150*  BUN 23 9 8   CALCIUM 9.7 8.3* 8.2*  CREATININE 0.79 0.61 0.66  GFRNONAA >60 >60 >60    LIVER FUNCTION TESTS: Recent Labs    07/18/21 1126  BILITOT 0.5  AST 23  ALT 24  ALKPHOS 81  PROT 8.0  ALBUMIN 4.2    Assessment and Plan: Pt with hx lower GI bleed, s/p mesenteric angio with coiling of middle colic artery branch PSA last night; no active extravasation or other vascular abnormalities identified on superior or inferior mesenteric angiogram; afebrile; hgb 8.5(7.7), creat nl; groin access site ok; Dr. Dwaine Gale also updated; monitor closely for any bowel ischemia; further plans as per GI/TRH   Electronically Signed: D. Rowe Robert, PA-C 07/24/2021, 1:02 PM   I spent a total of 15 Minutes at the the patient's bedside AND on the patient's hospital floor or unit, greater than 50% of which was counseling/coordinating care for mesenteric arteriogram with coil embolization of middle colic artery branch pseudoaneurysm    Patient ID: Melissa [name], female   DOB: 09/09/1945, 75 y.o.   MRN: [id number]

## 2021-07-24 NOTE — Progress Notes (Signed)
2 mg morphine pulled from pyxis not under this pt's name at 0155, only 1mg  of morphine IV given to this pt as ordered. Pharmacist [place] notified of event. Charge nurse [employer] [employer] notified and witnessed the waste of the remaining 1mg .

## 2021-07-24 NOTE — Progress Notes (Signed)
[facility] Gastroenterology Progress Note  Melissa [name] 75 y.o. 08-05-1946  CC: Lower GI bleed  Subjective: Patient seen and examined at bedside.  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  Started having lower abdominal pain radiating to her suprapubic area after the procedure yesterday.  ROS : Negative for nausea and vomiting.  Afebrile.   Objective: Vital signs in last 24 hours: Vitals:   07/24/21 0600 07/24/21 0800  BP: (!) 128/53 (!) 128/52  Pulse: 80 84  Resp: 20 16  Temp:    SpO2: 96% 96%    Physical Exam:  General:  Alert, cooperative, no distress, appears stated age  Head:  Normocephalic, without obvious abnormality, atraumatic  Eyes:  , EOM's intact,   Lungs:   Clear to auscultation bilaterally, respirations unlabored  Heart:  Regular rate and rhythm, S1, S2 normal  Abdomen:   Soft, suprapubic tenderness to palpation, bowel sounds present, no peritoneal signs.  Extremities: Extremities normal, atraumatic, no  edema  Pulses: 2+ and symmetric    Lab Results: Recent Labs    07/23/21 0511 07/24/21 0032  NA 140 138  K 3.7 3.4*  CL 110 108  CO2 24 22  GLUCOSE 152* 150*  BUN 9 8  CREATININE 0.61 0.66  CALCIUM 8.3* 8.2*    No results for input(s): AST, ALT, ALKPHOS, BILITOT, PROT, ALBUMIN in the last 72 hours.  Recent Labs    07/23/21 0511 07/23/21 1140 07/24/21 0032 07/24/21 0618  WBC 5.1  --  6.4  --   HGB 6.7*   < > 7.7*  7.8* 8.5*  HCT 20.4*  --  24.4*  --   MCV 97.6  --  97.6  --   PLT 211  --  194  --    < > = values in this interval not displayed.   Recent Labs    07/24/21 0032  LABPROT 14.2  INR 1.1      Assessment/Plan: -Lower GI bleed. 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  CT Angio 11/19 -negative for active bleeding  GI bleeding scan 11/20 positive for bleeding from splenic flexure.  Repeat CT Angio - 11/22 -negative for active  bleeding Colonoscopy 11/23 -blood throughout the colon without evidence of active bleeding.   -Acute blood loss anemia -History of partial colon resection in 1990s  Recommendations ------------------------ -Repeat bleeding scan positive for possible bleeding at distal transverse or splenic flexure.   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  -She is having lower abdominal pain radiating to her suprapubic area since procedure yesterday.  Physical exam findings not consistent with peritonitis but she received Dilaudid just prior to my examination  -If pain persist, recommend CT abdomen pelvis with IV contrast to rule out any complication from embolization.  -May benefit from surgery consultation  -Keep n.p.o. for now.  Okay to start clear liquid diet if improvement in pain without receiving pain medication.  -Hemoglobin stable this morning -No further inpatient GI work-up planned.   -GI will sign off.  Call us back if needed   Kathi Der MD, FACP 07/24/2021, 9:28 AM  Contact #  [phone]

## 2021-07-24 NOTE — Consult Note (Signed)
Reason for Consult: lower GI bleeding, abdominal pain after embolization  Referring Physician: Dr. Shonna Chock, [facility]  Melissa [name] is an 75 y.o. female.  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  Patient underwent subsequent colostomy takedown.  Patient has a known history of diverticular disease which has been noted on her most recent colonoscopy.  Patient was admitted 6 days ago with lower GI bleeding.  There was a significant drop in her hemoglobin.  Patient underwent colonoscopy with the finding of diverticulosis and blood throughout the colon without an active site of bleeding identified.  Patient subsequently underwent a nuclear medicine bleeding scan which localized evidence of bleeding to the transverse colon.  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  I have personally reviewed the studies.  Patient subsequently developed abdominal pain.  Concern was raised over the possibility of ischemia.  General surgery is consulted at this time for evaluation and recommendations for management.  Other abdominal surgery includes cesarean section.  Patient's husband is in the room.  Her daughter is also in the room.  Her daughter is a physician and specializes in oculoplastic surgery.  Past Medical History:  Diagnosis Date   Diabetes mellitus without complication (HCC)    Diverticulosis    Hypercholesteremia    Hypertension    Kidney stones     Past Surgical History:  Procedure Laterality Date   CESAREAN SECTION     COLON SURGERY     COLONOSCOPY WITH PROPOFOL N/A 10/12/2016   Procedure: COLONOSCOPY WITH PROPOFOL;  Surgeon: Charolett Bumpers, MD;  Location: [facility];  Service: Endoscopy;  Laterality:  N/A;   IR ANGIOGRAM SELECTIVE EACH ADDITIONAL VESSEL  07/23/2021   IR ANGIOGRAM VISCERAL SELECTIVE  07/23/2021   IR ANGIOGRAM VISCERAL SELECTIVE  07/23/2021   IR EMBO ART  VEN HEMORR LYMPH EXTRAV  INC GUIDE ROADMAPPING  07/23/2021   IR US GUIDE VASC ACCESS RIGHT  07/23/2021    History reviewed. No pertinent family history.  Social History:  reports that she has quit smoking. She has a 1.00 pack-year smoking history. She has never used smokeless tobacco. She reports current alcohol use. She reports that she does not use drugs.  Allergies:  Allergies  Allergen Reactions   Ciprofloxacin Other (See Comments)    Tendonitis and joint pain   Amlodipine Besylate Other (See Comments)    Joint pain   Atorvastatin Other (See Comments)    "Joint pains / ?gout"   Metformin Hcl Er Diarrhea   Spironolactone Other (See Comments)    "Hair changes"    Medications: I have reviewed the patient's current medications.  Results for orders placed or performed during the hospital encounter of 07/18/21 (from the past 48 hour(s))  Glucose, capillary     Status: Abnormal   Collection Time: 07/22/21  3:50 PM  Result Value Ref Range   Glucose-Capillary 175 (H) 70 - 99 mg/dL    Comment: Glucose reference range applies only to samples taken after fasting for at least 8 hours.   Comment 1 Notify RN   Hemoglobin     Status: Abnormal   Collection Time: 07/22/21  7:13 PM  Result Value Ref Range   Hemoglobin 7.9 (L) 12.0 - 15.0 g/dL    Comment: Performed at [facility], [address] [place], [place] [address]  Glucose, capillary     Status: Abnormal   Collection Time: 07/22/21  8:07 PM  Result Value Ref Range   Glucose-Capillary 147 (H) 70 - 99 mg/dL    Comment: Glucose reference range applies only to samples taken after fasting for at least 8 hours.  Glucose, capillary     Status: Abnormal   Collection Time: 07/22/21 11:33 PM  Result Value Ref Range   Glucose-Capillary 113 (H) 70 -  99 mg/dL    Comment: Glucose reference range applies only to samples taken after fasting for at least 8 hours.  Glucose, capillary     Status: Abnormal   Collection Time: 07/23/21  4:36 AM  Result Value Ref Range   Glucose-Capillary 143 (H) 70 - 99 mg/dL    Comment: Glucose reference range applies only to samples taken after fasting for at least 8 hours.  CBC     Status: Abnormal   Collection Time: 07/23/21  5:11 AM  Result Value Ref Range   WBC 5.1 4.0 - 10.5 K/uL   RBC 2.09 (L) 3.87 - 5.11 MIL/uL   Hemoglobin 6.7 (LL) 12.0 - 15.0 g/dL    Comment: REPEATED TO VERIFY THIS CRITICAL RESULT HAS VERIFIED AND BEEN CALLED TO RYAN,T. RN BY NICOLE MCCOY ON 11 24 2022 AT 0531, AND HAS BEEN READ BACK. CRITICAL RESULT VERIFIED    HCT 20.4 (L) 36.0 - 46.0 %   MCV 97.6 80.0 - 100.0 fL   MCH 32.1 26.0 - 34.0 pg   MCHC 32.8 30.0 - 36.0 g/dL   RDW 81.1 91.4 - 78.2 %   Platelets 211 150 - 400 K/uL   nRBC 0.4 (H) 0.0 - 0.2 %    Comment: Performed at [facility], [address], [place], [place] [address]  Basic metabolic panel     Status: Abnormal   Collection Time: 07/23/21  5:11 AM  Result Value Ref Range   Sodium 140 135 - 145 mmol/L   Potassium 3.7 3.5 - 5.1 mmol/L   Chloride 110 98 - 111 mmol/L   CO2 24 22 - 32 mmol/L   Glucose, Bld 152 (H) 70 - 99 mg/dL    Comment: Glucose reference range applies only to samples taken after fasting for at least 8 hours.   BUN 9 8 - 23 mg/dL   Creatinine, Ser 3.08 0.44 - 1.00 mg/dL   Calcium 8.3 (L) 8.9 - 10.3 mg/dL   GFR, Estimated >65 >78 mL/min    Comment: (NOTE) Calculated using the CKD-EPI Creatinine Equation (2021)    Anion gap 6 5 - 15    Comment: Performed at [facility], [address] [place], [place] [address]  Prepare RBC (crossmatch)     Status: None   Collection Time: 07/23/21  5:44 AM  Result Value Ref Range   Order Confirmation      BB SAMPLE OR UNITS ALREADY AVAILABLE Performed at [facility], [address], [place], [place] [address]   Glucose, capillary     Status: Abnormal   Collection Time: 07/23/21  7:45 AM  Result Value Ref Range   Glucose-Capillary 167 (H) 70 - 99 mg/dL    Comment: Glucose reference range applies only to samples taken after fasting for at least 8 hours.  Glucose, capillary     Status: Abnormal   Collection Time: 07/23/21 10:55 AM  Result Value Ref Range   Glucose-Capillary 140 (  H) 70 - 99 mg/dL    Comment: Glucose reference range applies only to samples taken after fasting for at least 8 hours.  Hemoglobin     Status: Abnormal   Collection Time: 07/23/21 11:40 AM  Result Value Ref Range   Hemoglobin 8.1 (L) 12.0 - 15.0 g/dL    Comment: Performed at [facility], [address] [place], [place] [address]  Prepare RBC (crossmatch)     Status: None   Collection Time: 07/23/21 12:18 PM  Result Value Ref Range   Order Confirmation      ORDER PROCESSED BY BLOOD BANK Performed at [facility], [address], [place], [place] [address]   Prepare platelet pheresis     Status: None (Preliminary result)   Collection Time: 07/23/21  3:32 PM  Result Value Ref Range   Unit Number [id number]    Blood Component Type PLTP2 PSORALEN TREATED    Unit division 00    Status of Unit REL FROM [facility]    Transfusion Status OK TO TRANSFUSE    Unit Number [id number]    Blood Component Type PLTP1 PSORALEN TREATED    Unit division 00    Status of Unit ISSUED    Transfusion Status      OK TO TRANSFUSE Performed at [facility], [address], [place], [place] [address]   Glucose, capillary     Status: Abnormal   Collection Time: 07/23/21  3:45 PM  Result Value Ref Range   Glucose-Capillary 135 (H) 70 - 99 mg/dL    Comment: Glucose reference range applies only to samples taken after fasting for at least 8 hours.  Hemoglobin     Status: Abnormal   Collection Time: 07/24/21 12:32 AM   Result Value Ref Range   Hemoglobin 7.7 (L) 12.0 - 15.0 g/dL    Comment: Performed at [facility], [address], [place], [place] [address]  Protime-INR     Status: None   Collection Time: 07/24/21 12:32 AM  Result Value Ref Range   Prothrombin Time 14.2 11.4 - 15.2 seconds   INR 1.1 0.8 - 1.2    Comment: (NOTE) INR goal varies based on device and disease states. Performed at [facility], [address] [place], [place] [address]   Basic metabolic panel     Status: Abnormal   Collection Time: 07/24/21 12:32 AM  Result Value Ref Range   Sodium 138 135 - 145 mmol/L   Potassium 3.4 (L) 3.5 - 5.1 mmol/L   Chloride 108 98 - 111 mmol/L   CO2 22 22 - 32 mmol/L   Glucose, Bld 150 (H) 70 - 99 mg/dL    Comment: Glucose reference range applies only to samples taken after fasting for at least 8 hours.   BUN 8 8 - 23 mg/dL   Creatinine, Ser 8.25 0.44 - 1.00 mg/dL   Calcium 8.2 (L) 8.9 - 10.3 mg/dL   GFR, Estimated >05 >39 mL/min    Comment: (NOTE) Calculated using the CKD-EPI Creatinine Equation (2021)    Anion gap 8 5 - 15    Comment: Performed at [facility], [address], [place], [place] [address]  CBC     Status: Abnormal   Collection Time: 07/24/21 12:32 AM  Result Value Ref Range   WBC 6.4 4.0 - 10.5 K/uL   RBC 2.50 (L) 3.87 - 5.11 MIL/uL   Hemoglobin 7.8 (L) 12.0 - 15.0 g/dL  HCT 24.4 (L) 36.0 - 46.0 %   MCV 97.6 80.0 - 100.0 fL   MCH 31.2 26.0 - 34.0 pg   MCHC 32.0 30.0 - 36.0 g/dL   RDW 50.3 (H) 88.8 - 28.0 %   Platelets 194 150 - 400 K/uL   nRBC 0.0 0.0 - 0.2 %    Comment: Performed at [facility], [address] [place], [place] [address]  Glucose, capillary     Status: Abnormal   Collection Time: 07/24/21  1:01 AM  Result Value Ref Range   Glucose-Capillary 150 (H) 70 - 99 mg/dL    Comment: Glucose reference range applies only to samples taken after fasting for at least 8 hours.   Glucose, capillary     Status: Abnormal   Collection Time: 07/24/21  4:01 AM  Result Value Ref Range   Glucose-Capillary 160 (H) 70 - 99 mg/dL    Comment: Glucose reference range applies only to samples taken after fasting for at least 8 hours.  Hemoglobin     Status: Abnormal   Collection Time: 07/24/21  6:18 AM  Result Value Ref Range   Hemoglobin 8.5 (L) 12.0 - 15.0 g/dL    Comment: Performed at [facility], [address] [place], [place] [address]  Glucose, capillary     Status: Abnormal   Collection Time: 07/24/21  7:43 AM  Result Value Ref Range   Glucose-Capillary 136 (H) 70 - 99 mg/dL    Comment: Glucose reference range applies only to samples taken after fasting for at least 8 hours.  Glucose, capillary     Status: Abnormal   Collection Time: 07/24/21 11:19 AM  Result Value Ref Range   Glucose-Capillary 129 (H) 70 - 99 mg/dL    Comment: Glucose reference range applies only to samples taken after fasting for at least 8 hours.  Magnesium     Status: Abnormal   Collection Time: 07/24/21 12:43 PM  Result Value Ref Range   Magnesium 1.6 (L) 1.7 - 2.4 mg/dL    Comment: Performed at [facility], [address], [place], [place] [address]  Lactic acid, plasma     Status: None   Collection Time: 07/24/21 12:43 PM  Result Value Ref Range   Lactic Acid, Venous 0.9 0.5 - 1.9 mmol/L    Comment: Performed at [facility], [address], [place], [place] [address]  Hemoglobin     Status: Abnormal   Collection Time: 07/24/21 12:43 PM  Result Value Ref Range   Hemoglobin 8.0 (L) 12.0 - 15.0 g/dL    Comment: Performed at [facility], [address], [place], [place] [address]    NM GI Blood Loss  Result Date: 07/23/2021 CLINICAL DATA:  75 year old female with GI bleeding. EXAM: NUCLEAR MEDICINE GASTROINTESTINAL BLEEDING SCAN TECHNIQUE: Sequential abdominal images were obtained following intravenous  administration of Tc-60m labeled red blood cells. RADIOPHARMACEUTICALS:  23.3 mCi Tc-35m pertechnetate in-vitro labeled red cells. COMPARISON:  07/21/2021 CTA, 07/19/2021 nuclear medicine GI bleeding study and prior exams FINDINGS: There is abnormal activity within a tubular structure within the mid abdomen compatible with active GI bleeding within the transverse colon, likely in the more distal aspect of the transverse colon/splenic flexure. This abnormal activity slightly progresses towards the end of the study. IMPRESSION: Active colonic GI bleeding with probable bleeding site in the more distal aspect of the transverse colon/splenic flexure. Electronically Signed   By: Harmon Pier M.D.   On:  07/23/2021 15:13   IR Angiogram Visceral Selective  Result Date: 07/24/2021 INDICATION: 75 year old woman with brisk lower GI bleed presents to IR for mesenteric angiogram and embolization. EXAM: 1. Ultrasound-guided access of right common femoral artery 2. Superior mesenteric angiogram 3. Inferior mesenteric angiogram 4. Left colic angiogram 5. Middle colic angiogram and pseudoaneurysm embolization MEDICATIONS: None ANESTHESIA/SEDATION: Moderate (conscious) sedation was employed during this procedure. A total of Versed 2 mg and Fentanyl 100 mcg was administered intravenously by the radiology nurse. Total intra-service moderate Sedation Time: 143 minutes. The patient's level of consciousness and vital signs were monitored continuously by radiology nursing throughout the procedure under my direct supervision. CONTRAST:  141 mL Omnipaque 300 FLUOROSCOPY TIME:  Fluoroscopy Time: 40 minutes 42 seconds (3726 mGy). COMPLICATIONS: None immediate. PROCEDURE: Informed consent was obtained from the patient following explanation of the procedure, risks, benefits and alternatives. Specifically, I discussed with the patient that the risks included, but are not limited to bleeding, infection, arterial injury, and bowel infarction  requiring surgical resection. The patient understands, agrees and consents for the procedure. All questions were addressed. A time out was performed prior to the initiation of the procedure. Maximal barrier sterile technique utilized including caps, mask, sterile gowns, sterile gloves, large sterile drape, hand hygiene, and chlorhexidine prep. Right groin skin prepped and draped in usual fashion. Ultrasound image documenting patency of the right common femoral artery was obtained and placed in permanent medical record. Sterile ultrasound probe cover and gel utilized throughout the procedure. Utilizing continuous ultrasound guidance, the right common femoral artery was accessed with a 21 gauge needle. 21 gauge needle exchanged for transitional dilator set over 0.018 inch guidewire. Transitional dilator set exchanged for 5 French sheath over 0.035 inch guidewire. Mickelson catheter utilized to select the superior mesenteric artery. Superior mesenteric arteriogram demonstrated replaced right hepatic artery, patent jejunal, right colic, and middle colic branches. 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 No active extravasation was seen. Mickelson catheter was then utilized to select the inferior mesenteric artery. Angiogram demonstrated patent left colic artery with some flow to the middle colic arcade through the marginal artery of [place]. STC microcatheter was advanced into the left colic and superior rectal arteries and angiograms were performed. No active extravasation was seen. Mickelson catheter was again utilized to select the superior mesenteric artery. SCC microcatheter advanced into the middle colic artery and angiogram was performed. 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 The microcatheter was advanced into the front door arterial branch and embolization was performed with a 3 mm Ruby coil. Post embolization DSA showed no  significant flow to the pseudoaneurysm from the front door branch. Microcatheter was then utilized to select the back door feeder of the pseudoaneurysm. There was significant arterial spasm, therefore 100 mcg of nitro was administered slowly into the branch resulting in significant improvement of spasm. The microcatheter was advanced to the level of the pseudoaneurysm and embolization was performed with 3 and 4 mm Ruby coils. Post embolization angiogram showed no significant flow to the pseudoaneurysm. Sheath angiogram showed appropriate puncture of the common femoral artery at the level of the femoral head prosthesis. The groin was re-prepped and draped and arteriotomy was closed with 6 [place] Angio-Seal device. 5 minutes of manual compression applied at the access site for additional hemostasis. IMPRESSION: 1. 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 The pseudoaneurysm was successfully coil embolized.  2. No active extravasation or other vascular abnormalities identified on superior or inferior mesenteric angiogram. Electronically Signed   By: Acquanetta Belling M.D.   On: 07/24/2021 11:32   IR Angiogram Visceral Selective  Result Date: 07/24/2021 INDICATION: 75 year old woman with brisk lower GI bleed presents to IR for mesenteric angiogram and embolization. EXAM: 1. Ultrasound-guided access of right common femoral artery 2. Superior mesenteric angiogram 3. Inferior mesenteric angiogram 4. Left colic angiogram 5. Middle colic angiogram and pseudoaneurysm embolization MEDICATIONS: None ANESTHESIA/SEDATION: Moderate (conscious) sedation was employed during this procedure. A total of Versed 2 mg and Fentanyl 100 mcg was administered intravenously by the radiology nurse. Total intra-service moderate Sedation Time: 143 minutes. The patient's level of consciousness and vital signs were monitored continuously by  radiology nursing throughout the procedure under my direct supervision. CONTRAST:  141 mL Omnipaque 300 FLUOROSCOPY TIME:  Fluoroscopy Time: 40 minutes 42 seconds (3726 mGy). COMPLICATIONS: None immediate. PROCEDURE: Informed consent was obtained from the patient following explanation of the procedure, risks, benefits and alternatives. Specifically, I discussed with the patient that the risks included, but are not limited to bleeding, infection, arterial injury, and bowel infarction requiring surgical resection. The patient understands, agrees and consents for the procedure. All questions were addressed. A time out was performed prior to the initiation of the procedure. Maximal barrier sterile technique utilized including caps, mask, sterile gowns, sterile gloves, large sterile drape, hand hygiene, and chlorhexidine prep. Right groin skin prepped and draped in usual fashion. Ultrasound image documenting patency of the right common femoral artery was obtained and placed in permanent medical record. Sterile ultrasound probe cover and gel utilized throughout the procedure. Utilizing continuous ultrasound guidance, the right common femoral artery was accessed with a 21 gauge needle. 21 gauge needle exchanged for transitional dilator set over 0.018 inch guidewire. Transitional dilator set exchanged for 5 French sheath over 0.035 inch guidewire. Mickelson catheter utilized to select the superior mesenteric artery. Superior mesenteric arteriogram demonstrated replaced right hepatic artery, patent jejunal, right colic, and middle colic branches. 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 No active extravasation was seen. Mickelson catheter was then utilized to select the inferior mesenteric artery. Angiogram demonstrated patent left colic artery with some flow to the middle colic arcade through the marginal artery of [place]. STC microcatheter was advanced into the left colic and  superior rectal arteries and angiograms were performed. No active extravasation was seen. Mickelson catheter was again utilized to select the superior mesenteric artery. SCC microcatheter advanced into the middle colic artery and angiogram was performed. 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 The microcatheter was advanced into the front door arterial branch and embolization was performed with a 3 mm Ruby coil. Post embolization DSA showed no significant flow to the pseudoaneurysm from the front door branch. Microcatheter was then utilized to select the back door feeder of the pseudoaneurysm. There was significant arterial spasm, therefore 100 mcg of nitro was administered slowly into the branch resulting in significant improvement of spasm. The microcatheter was advanced to the level of the pseudoaneurysm and embolization was performed with 3 and 4 mm Ruby coils. Post embolization angiogram showed no significant flow to the pseudoaneurysm. Sheath angiogram showed appropriate puncture of the common femoral artery at the level of the femoral head prosthesis. The groin was re-prepped and draped and arteriotomy was closed with 6 [place] Angio-Seal device. 5 minutes of manual compression applied at the access site for additional  hemostasis. IMPRESSION: 1. 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 The pseudoaneurysm was successfully coil embolized. 2. No active extravasation or other vascular abnormalities identified on superior or inferior mesenteric angiogram. Electronically Signed   By: Acquanetta Belling M.D.   On: 07/24/2021 11:32   IR Angiogram Selective Each Additional Vessel  Result Date: 07/24/2021 INDICATION: 75 year old woman with brisk lower GI bleed presents to IR for mesenteric angiogram and embolization. EXAM: 1. Ultrasound-guided access of right common femoral artery 2.  Superior mesenteric angiogram 3. Inferior mesenteric angiogram 4. Left colic angiogram 5. Middle colic angiogram and pseudoaneurysm embolization MEDICATIONS: None ANESTHESIA/SEDATION: Moderate (conscious) sedation was employed during this procedure. A total of Versed 2 mg and Fentanyl 100 mcg was administered intravenously by the radiology nurse. Total intra-service moderate Sedation Time: 143 minutes. The patient's level of consciousness and vital signs were monitored continuously by radiology nursing throughout the procedure under my direct supervision. CONTRAST:  141 mL Omnipaque 300 FLUOROSCOPY TIME:  Fluoroscopy Time: 40 minutes 42 seconds (3726 mGy). COMPLICATIONS: None immediate. PROCEDURE: Informed consent was obtained from the patient following explanation of the procedure, risks, benefits and alternatives. Specifically, I discussed with the patient that the risks included, but are not limited to bleeding, infection, arterial injury, and bowel infarction requiring surgical resection. The patient understands, agrees and consents for the procedure. All questions were addressed. A time out was performed prior to the initiation of the procedure. Maximal barrier sterile technique utilized including caps, mask, sterile gowns, sterile gloves, large sterile drape, hand hygiene, and chlorhexidine prep. Right groin skin prepped and draped in usual fashion. Ultrasound image documenting patency of the right common femoral artery was obtained and placed in permanent medical record. Sterile ultrasound probe cover and gel utilized throughout the procedure. Utilizing continuous ultrasound guidance, the right common femoral artery was accessed with a 21 gauge needle. 21 gauge needle exchanged for transitional dilator set over 0.018 inch guidewire. Transitional dilator set exchanged for 5 French sheath over 0.035 inch guidewire. Mickelson catheter utilized to select the superior mesenteric artery. Superior mesenteric  arteriogram demonstrated replaced right hepatic artery, patent jejunal, right colic, and middle colic branches. 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 No active extravasation was seen. Mickelson catheter was then utilized to select the inferior mesenteric artery. Angiogram demonstrated patent left colic artery with some flow to the middle colic arcade through the marginal artery of [place]. STC microcatheter was advanced into the left colic and superior rectal arteries and angiograms were performed. No active extravasation was seen. Mickelson catheter was again utilized to select the superior mesenteric artery. SCC microcatheter advanced into the middle colic artery and angiogram was performed. 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 The microcatheter was advanced into the front door arterial branch and embolization was performed with a 3 mm Ruby coil. Post embolization DSA showed no significant flow to the pseudoaneurysm from the front door branch. Microcatheter was then utilized to select the back door feeder of the pseudoaneurysm. There was significant arterial spasm, therefore 100 mcg of nitro was administered slowly into the branch resulting in significant improvement of spasm. The microcatheter was advanced to the level of the pseudoaneurysm and embolization was performed with 3 and 4 mm Ruby coils. Post embolization angiogram showed no significant flow to the pseudoaneurysm. Sheath angiogram showed appropriate puncture of the common femoral artery at the level of the femoral  head prosthesis. The groin was re-prepped and draped and arteriotomy was closed with 6 [place] Angio-Seal device. 5 minutes of manual compression applied at the access site for additional hemostasis. IMPRESSION: 1. 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  The pseudoaneurysm was successfully coil embolized. 2. No active extravasation or other vascular abnormalities identified on superior or inferior mesenteric angiogram. Electronically Signed   By: Acquanetta Belling M.D.   On: 07/24/2021 11:32   IR US Guide Vasc Access Right  Result Date: 07/24/2021 INDICATION: 75 year old woman with brisk lower GI bleed presents to IR for mesenteric angiogram and embolization. EXAM: 1. Ultrasound-guided access of right common femoral artery 2. Superior mesenteric angiogram 3. Inferior mesenteric angiogram 4. Left colic angiogram 5. Middle colic angiogram and pseudoaneurysm embolization MEDICATIONS: None ANESTHESIA/SEDATION: Moderate (conscious) sedation was employed during this procedure. A total of Versed 2 mg and Fentanyl 100 mcg was administered intravenously by the radiology nurse. Total intra-service moderate Sedation Time: 143 minutes. The patient's level of consciousness and vital signs were monitored continuously by radiology nursing throughout the procedure under my direct supervision. CONTRAST:  141 mL Omnipaque 300 FLUOROSCOPY TIME:  Fluoroscopy Time: 40 minutes 42 seconds (3726 mGy). COMPLICATIONS: None immediate. PROCEDURE: Informed consent was obtained from the patient following explanation of the procedure, risks, benefits and alternatives. Specifically, I discussed with the patient that the risks included, but are not limited to bleeding, infection, arterial injury, and bowel infarction requiring surgical resection. The patient understands, agrees and consents for the procedure. All questions were addressed. A time out was performed prior to the initiation of the procedure. Maximal barrier sterile technique utilized including caps, mask, sterile gowns, sterile gloves, large sterile drape, hand hygiene, and chlorhexidine prep. Right groin skin prepped and draped in usual fashion. Ultrasound image documenting patency of the right common femoral artery was obtained and  placed in permanent medical record. Sterile ultrasound probe cover and gel utilized throughout the procedure. Utilizing continuous ultrasound guidance, the right common femoral artery was accessed with a 21 gauge needle. 21 gauge needle exchanged for transitional dilator set over 0.018 inch guidewire. Transitional dilator set exchanged for 5 French sheath over 0.035 inch guidewire. Mickelson catheter utilized to select the superior mesenteric artery. Superior mesenteric arteriogram demonstrated replaced right hepatic artery, patent jejunal, right colic, and middle colic branches. 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 No active extravasation was seen. Mickelson catheter was then utilized to select the inferior mesenteric artery. Angiogram demonstrated patent left colic artery with some flow to the middle colic arcade through the marginal artery of [place]. STC microcatheter was advanced into the left colic and superior rectal arteries and angiograms were performed. No active extravasation was seen. Mickelson catheter was again utilized to select the superior mesenteric artery. SCC microcatheter advanced into the middle colic artery and angiogram was performed. 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 The microcatheter was advanced into the front door arterial branch and embolization was performed with a 3 mm Ruby coil. Post embolization DSA showed no significant flow to the pseudoaneurysm from the front door branch. Microcatheter was then utilized to select the back door feeder of the pseudoaneurysm. There was significant arterial spasm, therefore 100 mcg of nitro was administered slowly into the branch resulting in significant improvement of spasm. The microcatheter was advanced to the level of the pseudoaneurysm and embolization was performed with 3 and 4  mm Ruby coils. Post embolization angiogram showed no significant flow to the  pseudoaneurysm. Sheath angiogram showed appropriate puncture of the common femoral artery at the level of the femoral head prosthesis. The groin was re-prepped and draped and arteriotomy was closed with 6 [place] Angio-Seal device. 5 minutes of manual compression applied at the access site for additional hemostasis. IMPRESSION: 1. 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 The pseudoaneurysm was successfully coil embolized. 2. No active extravasation or other vascular abnormalities identified on superior or inferior mesenteric angiogram. Electronically Signed   By: Acquanetta Belling M.D.   On: 07/24/2021 11:32   IR EMBO ART  VEN HEMORR LYMPH EXTRAV  INC GUIDE ROADMAPPING  Result Date: 07/24/2021 INDICATION: 75 year old woman with brisk lower GI bleed presents to IR for mesenteric angiogram and embolization. EXAM: 1. Ultrasound-guided access of right common femoral artery 2. Superior mesenteric angiogram 3. Inferior mesenteric angiogram 4. Left colic angiogram 5. Middle colic angiogram and pseudoaneurysm embolization MEDICATIONS: None ANESTHESIA/SEDATION: Moderate (conscious) sedation was employed during this procedure. A total of Versed 2 mg and Fentanyl 100 mcg was administered intravenously by the radiology nurse. Total intra-service moderate Sedation Time: 143 minutes. The patient's level of consciousness and vital signs were monitored continuously by radiology nursing throughout the procedure under my direct supervision. CONTRAST:  141 mL Omnipaque 300 FLUOROSCOPY TIME:  Fluoroscopy Time: 40 minutes 42 seconds (3726 mGy). COMPLICATIONS: None immediate. PROCEDURE: Informed consent was obtained from the patient following explanation of the procedure, risks, benefits and alternatives. Specifically, I discussed with the patient that the risks included, but are not limited to bleeding, infection, arterial injury, and  bowel infarction requiring surgical resection. The patient understands, agrees and consents for the procedure. All questions were addressed. A time out was performed prior to the initiation of the procedure. Maximal barrier sterile technique utilized including caps, mask, sterile gowns, sterile gloves, large sterile drape, hand hygiene, and chlorhexidine prep. Right groin skin prepped and draped in usual fashion. Ultrasound image documenting patency of the right common femoral artery was obtained and placed in permanent medical record. Sterile ultrasound probe cover and gel utilized throughout the procedure. Utilizing continuous ultrasound guidance, the right common femoral artery was accessed with a 21 gauge needle. 21 gauge needle exchanged for transitional dilator set over 0.018 inch guidewire. Transitional dilator set exchanged for 5 French sheath over 0.035 inch guidewire. Mickelson catheter utilized to select the superior mesenteric artery. Superior mesenteric arteriogram demonstrated replaced right hepatic artery, patent jejunal, right colic, and middle colic branches. 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 No active extravasation was seen. Mickelson catheter was then utilized to select the inferior mesenteric artery. Angiogram demonstrated patent left colic artery with some flow to the middle colic arcade through the marginal artery of [place]. STC microcatheter was advanced into the left colic and superior rectal arteries and angiograms were performed. No active extravasation was seen. Mickelson catheter was again utilized to select the superior mesenteric artery. SCC microcatheter advanced into the middle colic artery and angiogram was performed. 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 The microcatheter was advanced into the front door arterial branch and embolization was performed with a 3 mm Ruby coil. Post embolization DSA  showed no significant flow to the pseudoaneurysm from the front door branch. Microcatheter was then utilized to select the back door feeder of the pseudoaneurysm. There was significant arterial  spasm, therefore 100 mcg of nitro was administered slowly into the branch resulting in significant improvement of spasm. The microcatheter was advanced to the level of the pseudoaneurysm and embolization was performed with 3 and 4 mm Ruby coils. Post embolization angiogram showed no significant flow to the pseudoaneurysm. Sheath angiogram showed appropriate puncture of the common femoral artery at the level of the femoral head prosthesis. The groin was re-prepped and draped and arteriotomy was closed with 6 [place] Angio-Seal device. 5 minutes of manual compression applied at the access site for additional hemostasis. IMPRESSION: 1. 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 The pseudoaneurysm was successfully coil embolized. 2. No active extravasation or other vascular abnormalities identified on superior or inferior mesenteric angiogram. Electronically Signed   By: Acquanetta Belling M.D.   On: 07/24/2021 11:32    Review of Systems  Constitutional: Negative.   HENT: Negative.    Eyes: Negative.   Respiratory: Negative.    Cardiovascular: Negative.   Gastrointestinal:  Positive for abdominal pain and blood in stool.  Endocrine: Negative.   Genitourinary: Negative.   Musculoskeletal: Negative.   Skin: Negative.   Allergic/Immunologic: Negative.   Neurological: Negative.   Hematological: Negative.   Psychiatric/Behavioral: Negative.     Physical Exam  Blood pressure 140/68, pulse 82, temperature 98.5 F (36.9 C), temperature source Oral, resp. rate 15, height 5\' 3"  (1.6 m), weight 87.1 kg, SpO2 92 %.  CONSTITUTIONAL: no acute distress; conversant; no obvious deformities  EYES: conjunctiva moist; no lid lag;  anicteric; pupils equal bilaterally  NECK: trachea midline; no thyroid nodularity  LUNGS: respiratory effort normal & unlabored; no wheeze; no rales  CV: rate and rhythm regular; no palpable thrills; no murmur  GI: Abdomen is soft and without distention; there is no significant tenderness to palpation.  There is a well-healed midline abdominal incision.  There is a small incisional hernia at the level of the umbilicus.  There is a dry dressing in the right inguinal region.  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 , 1994 Rule out colonic ischemia following embolization procedure  WBC, potassium, bicarb, lactate all normal  Pain much improved this afternoon (has been given narcotics)  Exam benign  I discussed with the patient and the family the possibility of focal colonic ischemia following embolization by interventional radiology.  We discussed the presence of significant collateral blood flow and the likelihood that this would be self-limited.  We discussed the potential need for operative intervention.  I would like to keep the patient n.p.o. for this evening.  We will plan to repeat her laboratory studies in the morning and I will examine the patient in the morning.  If she has no further symptoms and remained stable, then we can initiate a diet tomorrow morning and advance as tolerated.  Hopefully this will improve without any need for operative  intervention.  Thank you for the consult.  We will follow the patient closely  with you.  Darnell Level, MD [facility] practice Office: [phone]   Darnell Level 07/24/2021, 3:09 PM

## 2021-07-24 NOTE — Progress Notes (Signed)
After pt arrival to unit at 2350 from [place], pt was given remaining unit of PRBC x1, which ended at 0415.   1 unit of platelets that is ordered was not given, primary care Rn was told that the ordered unit was expired and unit must be sent from another facility. Lab tech stated she will call with more information on unit of platelets.    Pt has been feeling discomfort in lower abd area, pain scored at 7/10 with no relief from the 1mg  of morphine IV or PRN dose of tylenol.  On call hospitalist team member notified of pt pain.  Pt did state that the warm blanker on her abdomen and being able to sit up was helpful, and relieve some pain.   No hematoma or discoloration was noted at surgical site, pulses in feet were consistently a +1 throughout the night shift as well.   Dayshift Rn notified of 1unit of platelets that still needs to be given to pt as soon as it becomes available.

## 2021-07-24 NOTE — Care Management Important Message (Signed)
Important Message  Patient Details IM Letter given to the Patient. Name: Melissa [name] MRN: [id number] Date of Birth: 10/11/1945   Medicare Important Message Given:  Yes     Caren Macadam 07/24/2021, 2:21 PM

## 2021-07-24 NOTE — Progress Notes (Signed)
PROGRESS NOTE    Melissa [name]  WGN:[id number] DOB: 06-27-1946 DOA: 07/18/2021 PCP: Marden Noble, MD    Brief Narrative:  75 y.o. female with history of prior GI bleed in 2017 and subsequent colonoscopy in 2018 was unremarkable with history of diabetes mellitus hypertension and hyperlipidemia presents to the ER with complaints of having multiple episodes of bleeding per rectum bright blood per rectum.  Denies any abdominal pain vomiting. In the ED, CTA without active bleed seen. Presenting hgb 12.7  Assessment & Plan:   Principal Problem:   Acute GI bleeding Active Problems:   Essential hypertension   Diabetes mellitus with complication (HCC)   Acute blood loss anemia   Acute GI bleed likely diverticular origin given the CT scan that showed diverticulosis Aspirin currently on hold Initial CTA neg for bleed but f/u nuc med pos for bleed at splenic flexure on 07/19/21. IR was consulted but per prior provider pt declined embolization at that time (husband says embolization was advised by an NP and they don't trust NPs).  Hgb cont to trend down with continued bleeding Pt then agreeable to embolization. Prior provider disucssed with IR who recommended repeat CTA for bleed, if neg, then plan on colonoscopy. It was neg so colonoscopy performed 11/23, copious blood in colon, no source identified Received 2 units 11/22 for hgb drop to 6s. Continued to have bloody BMs on 11/23 after colonoscopy but hgb stable at 8. Overnight no hematochezia but it returned morning of 11/24 and continued throughout the day. Hgb 6.7 to start the day, transfused 3 units (has now received total 5 units).  Tagged rbc scan 11/24 shows bleeding @ splenic flexure. IR consulted, performed angiography with embolization late evening of 11/24. Also advised transfusing one unit of platelets which was ordered. Since procedure has had one bloody bowel movement. Bleeding frequency much decreased. Hgb this morning 8.7 Since  procedure has developed 8/10 periumbilical pain. Discussed w/ GI and IR who advises gen surg consult Will monitor hgb q8 today Monitor vitals q4 Maintain 2 PIVs w/ nursing Maintain NPO History of hypertension  BP stable Continued on PRN hydralazine Given downtrending hgb, would cont to hold home bp meds at this time Diabetes mellitus type 2  Cont with SSI coverage as needed Glycemic trends stable Hold oral hyperglycemic meds while in hospital History of hyperlipidemia. Cholesterol meds currently on hold Would resume when acute blood loss anemia is stabilized/resolved  DVT prophylaxis: SCD's Code Status: Full Family Communication: husband and daughter updated @ bedside 11/25  Status is: Inpatient  Requires inpatient because: severity of illness    Consultants:  GI IR  Procedures:  IR angiography with embolization 11/24  Antimicrobials: Anti-infectives (From admission, onward)    None       Subjective: 8/10 abdominal pain midline just below umbilicus, no n/v/d  Objective: Vitals:   07/24/21 0900 07/24/21 1000 07/24/21 1100 07/24/21 1135  BP: (!) 131/54 (!) 145/56 (!) 127/57 (!) 143/58  Pulse: 79 85 94 91  Resp: 20 13 16 18   Temp:   98 F (36.7 C) 98.1 F (36.7 C)  TempSrc:   Oral Oral  SpO2: 94% 98% 97% 98%  Weight:      Height:        Intake/Output Summary (Last 24 hours) at 07/24/2021 1155 Last data filed at 07/24/2021 0415 Gross per 24 hour  Intake 3055.09 ml  Output 400 ml  Net 2655.09 ml   Filed Weights   07/18/21 1051  Weight: 87.1 kg  Examination: General exam: Awake, laying in bed, in nad Respiratory system: Normal respiratory effort, no wheezing Cardiovascular system: regular rate, s1, s2 Gastrointestinal system: Soft, mild ttp below umbilicus, no rebound or guarding Central nervous system: CN2-12 grossly intact, strength intact Extremities: Perfused, no clubbing Skin: Normal skin turgor, no notable skin lesions seen Psychiatry:  Mood normal // no visual hallucinations   Data Reviewed: I have personally reviewed following labs and imaging studies  CBC: Recent Labs  Lab 07/21/21 0446 07/21/21 1526 07/22/21 0638 07/22/21 1358 07/22/21 1913 07/23/21 0511 07/23/21 1140 07/24/21 0032 07/24/21 0618  WBC 6.8 6.4  --  6.9  --  5.1  --  6.4  --   HGB 7.2* 6.4* 8.3* 8.0* 7.9* 6.7* 8.1* 7.7*  7.8* 8.5*  HCT 22.7* 20.1* 25.2* 24.4*  --  20.4*  --  24.4*  --   MCV 100.0 100.5*  --  95.3  --  97.6  --  97.6  --   PLT 242 230  --  227  --  211  --  194  --    Basic Metabolic Panel: Recent Labs  Lab 07/18/21 1126 07/23/21 0511 07/24/21 0032  NA 138 140 138  K 4.4 3.7 3.4*  CL 105 110 108  CO2 25 24 22   GLUCOSE 199* 152* 150*  BUN 23 9 8   CREATININE 0.79 0.61 0.66  CALCIUM 9.7 8.3* 8.2*   GFR: Estimated Creatinine Clearance: 61.6 mL/min (by C-G formula based on SCr of 0.66 mg/dL). Liver Function Tests: Recent Labs  Lab 07/18/21 1126  AST 23  ALT 24  ALKPHOS 81  BILITOT 0.5  PROT 8.0  ALBUMIN 4.2   No results for input(s): LIPASE, AMYLASE in the last 168 hours. No results for input(s): AMMONIA in the last 168 hours. Coagulation Profile: Recent Labs  Lab 07/24/21 0032  INR 1.1   Cardiac Enzymes: No results for input(s): CKTOTAL, CKMB, CKMBINDEX, TROPONINI in the last 168 hours. BNP (last 3 results) No results for input(s): PROBNP in the last 8760 hours. HbA1C: No results for input(s): HGBA1C in the last 72 hours. CBG: Recent Labs  Lab 07/23/21 1545 07/24/21 0101 07/24/21 0401 07/24/21 0743 07/24/21 1119  GLUCAP 135* 150* 160* 136* 129*   Lipid Profile: No results for input(s): CHOL, HDL, LDLCALC, TRIG, CHOLHDL, LDLDIRECT in the last 72 hours. Thyroid Function Tests: No results for input(s): TSH, T4TOTAL, FREET4, T3FREE, THYROIDAB in the last 72 hours. Anemia Panel: No results for input(s): VITAMINB12, FOLATE, FERRITIN, TIBC, IRON, RETICCTPCT in the last 72 hours. Sepsis Labs: No  results for input(s): PROCALCITON, LATICACIDVEN in the last 168 hours.  Recent Results (from the past 240 hour(s))  Resp Panel by RT-PCR (Flu A&B, Covid) Nasopharyngeal Swab     Status: None   Collection Time: 07/18/21  6:30 PM   Specimen: Nasopharyngeal Swab; Nasopharyngeal(NP) swabs in vial transport medium  Result Value Ref Range Status   SARS Coronavirus 2 by RT PCR NEGATIVE NEGATIVE Final    Comment: (NOTE) SARS-CoV-2 target nucleic acids are NOT DETECTED.  The SARS-CoV-2 RNA is generally detectable in upper respiratory specimens during the acute phase of infection. The lowest concentration of SARS-CoV-2 viral copies this assay can detect is 138 copies/mL. A negative result does not preclude SARS-Cov-2 infection and should not be used as the sole basis for treatment or other patient management decisions. A negative result may occur with  improper specimen collection/handling, submission of specimen other than nasopharyngeal swab, presence of viral mutation(s) within the  areas targeted by this assay, and inadequate number of viral copies(<138 copies/mL). A negative result must be combined with clinical observations, patient history, and epidemiological information. The expected result is Negative.  Fact Sheet for Patients:  BloggerCourse.com  Fact Sheet for Healthcare Providers:  SeriousBroker.it  This test is no t yet approved or cleared by the [place] FDA and  has been authorized for detection and/or diagnosis of SARS-CoV-2 by FDA under an Emergency Use Authorization (EUA). This EUA will remain  in effect (meaning this test can be used) for the duration of the COVID-19 declaration under Section 564(b)(1) of the Act, 21 U.S.C.section 360bbb-3(b)(1), unless the authorization is terminated  or revoked sooner.       Influenza A by PCR NEGATIVE NEGATIVE Final   Influenza B by PCR NEGATIVE NEGATIVE Final    Comment:  (NOTE) The Xpert Xpress SARS-CoV-2/FLU/RSV plus assay is intended as an aid in the diagnosis of influenza from Nasopharyngeal swab specimens and should not be used as a sole basis for treatment. Nasal washings and aspirates are unacceptable for Xpert Xpress SARS-CoV-2/FLU/RSV testing.  Fact Sheet for Patients: BloggerCourse.com  Fact Sheet for Healthcare Providers: SeriousBroker.it  This test is not yet approved or cleared by the [place] FDA and has been authorized for detection and/or diagnosis of SARS-CoV-2 by FDA under an Emergency Use Authorization (EUA). This EUA will remain in effect (meaning this test can be used) for the duration of the COVID-19 declaration under Section 564(b)(1) of the Act, 21 U.S.C. section 360bbb-3(b)(1), unless the authorization is terminated or revoked.  Performed at [facility], [address] [place], [place] [address]       Radiology Studies: NM GI Blood Loss  Result Date: 07/23/2021 CLINICAL DATA:  75 year old female with GI bleeding. EXAM: NUCLEAR MEDICINE GASTROINTESTINAL BLEEDING SCAN TECHNIQUE: Sequential abdominal images were obtained following intravenous administration of Tc-60m labeled red blood cells. RADIOPHARMACEUTICALS:  23.3 mCi Tc-26m pertechnetate in-vitro labeled red cells. COMPARISON:  07/21/2021 CTA, 07/19/2021 nuclear medicine GI bleeding study and prior exams FINDINGS: There is abnormal activity within a tubular structure within the mid abdomen compatible with active GI bleeding within the transverse colon, likely in the more distal aspect of the transverse colon/splenic flexure. This abnormal activity slightly progresses towards the end of the study. IMPRESSION: Active colonic GI bleeding with probable bleeding site in the more distal aspect of the transverse colon/splenic flexure. Electronically Signed   By: Harmon Pier M.D.   On: 07/23/2021 15:13   IR  Angiogram Visceral Selective  Result Date: 07/24/2021 INDICATION: 75 year old woman with brisk lower GI bleed presents to IR for mesenteric angiogram and embolization. EXAM: 1. Ultrasound-guided access of right common femoral artery 2. Superior mesenteric angiogram 3. Inferior mesenteric angiogram 4. Left colic angiogram 5. Middle colic angiogram and pseudoaneurysm embolization MEDICATIONS: None ANESTHESIA/SEDATION: Moderate (conscious) sedation was employed during this procedure. A total of Versed 2 mg and Fentanyl 100 mcg was administered intravenously by the radiology nurse. Total intra-service moderate Sedation Time: 143 minutes. The patient's level of consciousness and vital signs were monitored continuously by radiology nursing throughout the procedure under my direct supervision. CONTRAST:  141 mL Omnipaque 300 FLUOROSCOPY TIME:  Fluoroscopy Time: 40 minutes 42 seconds (3726 mGy). COMPLICATIONS: None immediate. PROCEDURE: Informed consent was obtained from the patient following explanation of the procedure, risks, benefits and alternatives. Specifically, I discussed with the patient that the risks included, but are not limited to bleeding, infection, arterial injury, and bowel infarction requiring surgical  resection. The patient understands, agrees and consents for the procedure. All questions were addressed. A time out was performed prior to the initiation of the procedure. Maximal barrier sterile technique utilized including caps, mask, sterile gowns, sterile gloves, large sterile drape, hand hygiene, and chlorhexidine prep. Right groin skin prepped and draped in usual fashion. Ultrasound image documenting patency of the right common femoral artery was obtained and placed in permanent medical record. Sterile ultrasound probe cover and gel utilized throughout the procedure. Utilizing continuous ultrasound guidance, the right common femoral artery was accessed with a 21 gauge needle. 21 gauge needle  exchanged for transitional dilator set over 0.018 inch guidewire. Transitional dilator set exchanged for 5 French sheath over 0.035 inch guidewire. Mickelson catheter utilized to select the superior mesenteric artery. Superior mesenteric arteriogram demonstrated replaced right hepatic artery, patent jejunal, right colic, and middle colic branches. 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 No active extravasation was seen. Mickelson catheter was then utilized to select the inferior mesenteric artery. Angiogram demonstrated patent left colic artery with some flow to the middle colic arcade through the marginal artery of [place]. STC microcatheter was advanced into the left colic and superior rectal arteries and angiograms were performed. No active extravasation was seen. Mickelson catheter was again utilized to select the superior mesenteric artery. SCC microcatheter advanced into the middle colic artery and angiogram was performed. 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 The microcatheter was advanced into the front door arterial branch and embolization was performed with a 3 mm Ruby coil. Post embolization DSA showed no significant flow to the pseudoaneurysm from the front door branch. Microcatheter was then utilized to select the back door feeder of the pseudoaneurysm. There was significant arterial spasm, therefore 100 mcg of nitro was administered slowly into the branch resulting in significant improvement of spasm. The microcatheter was advanced to the level of the pseudoaneurysm and embolization was performed with 3 and 4 mm Ruby coils. Post embolization angiogram showed no significant flow to the pseudoaneurysm. Sheath angiogram showed appropriate puncture of the common femoral artery at the level of the femoral head prosthesis. The groin was re-prepped and draped and arteriotomy was closed with 6 [place] Angio-Seal device. 5 minutes  of manual compression applied at the access site for additional hemostasis. IMPRESSION: 1. 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 The pseudoaneurysm was successfully coil embolized. 2. No active extravasation or other vascular abnormalities identified on superior or inferior mesenteric angiogram. Electronically Signed   By: Acquanetta Belling M.D.   On: 07/24/2021 11:32   IR Angiogram Visceral Selective  Result Date: 07/24/2021 INDICATION: 75 year old woman with brisk lower GI bleed presents to IR for mesenteric angiogram and embolization. EXAM: 1. Ultrasound-guided access of right common femoral artery 2. Superior mesenteric angiogram 3. Inferior mesenteric angiogram 4. Left colic angiogram 5. Middle colic angiogram and pseudoaneurysm embolization MEDICATIONS: None ANESTHESIA/SEDATION: Moderate (conscious) sedation was employed during this procedure. A total of Versed 2 mg and Fentanyl 100 mcg was administered intravenously by the radiology nurse. Total intra-service moderate Sedation Time: 143 minutes. The patient's level of consciousness and vital signs were monitored continuously by radiology nursing throughout the procedure under my direct supervision. CONTRAST:  141 mL Omnipaque 300 FLUOROSCOPY TIME:  Fluoroscopy Time: 40 minutes 42 seconds (3726 mGy). COMPLICATIONS: None immediate. PROCEDURE: Informed consent was obtained from the patient following explanation of the procedure, risks,  benefits and alternatives. Specifically, I discussed with the patient that the risks included, but are not limited to bleeding, infection, arterial injury, and bowel infarction requiring surgical resection. The patient understands, agrees and consents for the procedure. All questions were addressed. A time out was performed prior to the initiation of the procedure. Maximal barrier sterile technique utilized including caps,  mask, sterile gowns, sterile gloves, large sterile drape, hand hygiene, and chlorhexidine prep. Right groin skin prepped and draped in usual fashion. Ultrasound image documenting patency of the right common femoral artery was obtained and placed in permanent medical record. Sterile ultrasound probe cover and gel utilized throughout the procedure. Utilizing continuous ultrasound guidance, the right common femoral artery was accessed with a 21 gauge needle. 21 gauge needle exchanged for transitional dilator set over 0.018 inch guidewire. Transitional dilator set exchanged for 5 French sheath over 0.035 inch guidewire. Mickelson catheter utilized to select the superior mesenteric artery. Superior mesenteric arteriogram demonstrated replaced right hepatic artery, patent jejunal, right colic, and middle colic branches. 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 No active extravasation was seen. Mickelson catheter was then utilized to select the inferior mesenteric artery. Angiogram demonstrated patent left colic artery with some flow to the middle colic arcade through the marginal artery of [place]. STC microcatheter was advanced into the left colic and superior rectal arteries and angiograms were performed. No active extravasation was seen. Mickelson catheter was again utilized to select the superior mesenteric artery. SCC microcatheter advanced into the middle colic artery and angiogram was performed. 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 The microcatheter was advanced into the front door arterial branch and embolization was performed with a 3 mm Ruby coil. Post embolization DSA showed no significant flow to the pseudoaneurysm from the front door branch. Microcatheter was then utilized to select the back door feeder of the pseudoaneurysm. There was significant arterial spasm, therefore 100 mcg of nitro was administered slowly into the  branch resulting in significant improvement of spasm. The microcatheter was advanced to the level of the pseudoaneurysm and embolization was performed with 3 and 4 mm Ruby coils. Post embolization angiogram showed no significant flow to the pseudoaneurysm. Sheath angiogram showed appropriate puncture of the common femoral artery at the level of the femoral head prosthesis. The groin was re-prepped and draped and arteriotomy was closed with 6 [place] Angio-Seal device. 5 minutes of manual compression applied at the access site for additional hemostasis. IMPRESSION: 1. 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 The pseudoaneurysm was successfully coil embolized. 2. No active extravasation or other vascular abnormalities identified on superior or inferior mesenteric angiogram. Electronically Signed   By: Acquanetta Belling M.D.   On: 07/24/2021 11:32   IR Angiogram Selective Each Additional Vessel  Result Date: 07/24/2021 INDICATION: 75 year old woman with brisk lower GI bleed presents to IR for mesenteric angiogram and embolization. EXAM: 1. Ultrasound-guided access of right common femoral artery 2. Superior mesenteric angiogram 3. Inferior mesenteric angiogram 4. Left colic angiogram 5. Middle colic angiogram and pseudoaneurysm embolization MEDICATIONS: None ANESTHESIA/SEDATION: Moderate (conscious) sedation was employed during this procedure. A total of Versed 2 mg and Fentanyl 100 mcg was administered intravenously by the radiology nurse. Total intra-service moderate Sedation Time: 143 minutes. The patient's level of consciousness and vital signs were monitored continuously by radiology nursing throughout the procedure under my direct supervision. CONTRAST:  141 mL Omnipaque  300 FLUOROSCOPY TIME:  Fluoroscopy Time: 40 minutes 42 seconds (3726 mGy). COMPLICATIONS: None immediate. PROCEDURE: Informed consent was  obtained from the patient following explanation of the procedure, risks, benefits and alternatives. Specifically, I discussed with the patient that the risks included, but are not limited to bleeding, infection, arterial injury, and bowel infarction requiring surgical resection. The patient understands, agrees and consents for the procedure. All questions were addressed. A time out was performed prior to the initiation of the procedure. Maximal barrier sterile technique utilized including caps, mask, sterile gowns, sterile gloves, large sterile drape, hand hygiene, and chlorhexidine prep. Right groin skin prepped and draped in usual fashion. Ultrasound image documenting patency of the right common femoral artery was obtained and placed in permanent medical record. Sterile ultrasound probe cover and gel utilized throughout the procedure. Utilizing continuous ultrasound guidance, the right common femoral artery was accessed with a 21 gauge needle. 21 gauge needle exchanged for transitional dilator set over 0.018 inch guidewire. Transitional dilator set exchanged for 5 French sheath over 0.035 inch guidewire. Mickelson catheter utilized to select the superior mesenteric artery. Superior mesenteric arteriogram demonstrated replaced right hepatic artery, patent jejunal, right colic, and middle colic branches. 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 No active extravasation was seen. Mickelson catheter was then utilized to select the inferior mesenteric artery. Angiogram demonstrated patent left colic artery with some flow to the middle colic arcade through the marginal artery of [place]. STC microcatheter was advanced into the left colic and superior rectal arteries and angiograms were performed. No active extravasation was seen. Mickelson catheter was again utilized to select the superior mesenteric artery. SCC microcatheter advanced into the middle colic artery and angiogram  was performed. 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 The microcatheter was advanced into the front door arterial branch and embolization was performed with a 3 mm Ruby coil. Post embolization DSA showed no significant flow to the pseudoaneurysm from the front door branch. Microcatheter was then utilized to select the back door feeder of the pseudoaneurysm. There was significant arterial spasm, therefore 100 mcg of nitro was administered slowly into the branch resulting in significant improvement of spasm. The microcatheter was advanced to the level of the pseudoaneurysm and embolization was performed with 3 and 4 mm Ruby coils. Post embolization angiogram showed no significant flow to the pseudoaneurysm. Sheath angiogram showed appropriate puncture of the common femoral artery at the level of the femoral head prosthesis. The groin was re-prepped and draped and arteriotomy was closed with 6 [place] Angio-Seal device. 5 minutes of manual compression applied at the access site for additional hemostasis. IMPRESSION: 1. 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 The pseudoaneurysm was successfully coil embolized. 2. No active extravasation or other vascular abnormalities identified on superior or inferior mesenteric angiogram. Electronically Signed   By: Acquanetta Belling M.D.   On: 07/24/2021 11:32   IR US Guide Vasc Access Right  Result Date: 07/24/2021 INDICATION: 75 year old woman with brisk lower GI bleed presents to IR for mesenteric angiogram and embolization. EXAM: 1. Ultrasound-guided access of right common femoral artery 2. Superior mesenteric angiogram 3. Inferior mesenteric angiogram 4. Left colic angiogram 5. Middle colic angiogram and pseudoaneurysm embolization MEDICATIONS: None ANESTHESIA/SEDATION: Moderate (conscious) sedation was employed during this procedure. A  total of Versed 2 mg and Fentanyl 100 mcg was administered intravenously by the radiology nurse. Total intra-service moderate  Sedation Time: 143 minutes. The patient's level of consciousness and vital signs were monitored continuously by radiology nursing throughout the procedure under my direct supervision. CONTRAST:  141 mL Omnipaque 300 FLUOROSCOPY TIME:  Fluoroscopy Time: 40 minutes 42 seconds (3726 mGy). COMPLICATIONS: None immediate. PROCEDURE: Informed consent was obtained from the patient following explanation of the procedure, risks, benefits and alternatives. Specifically, I discussed with the patient that the risks included, but are not limited to bleeding, infection, arterial injury, and bowel infarction requiring surgical resection. The patient understands, agrees and consents for the procedure. All questions were addressed. A time out was performed prior to the initiation of the procedure. Maximal barrier sterile technique utilized including caps, mask, sterile gowns, sterile gloves, large sterile drape, hand hygiene, and chlorhexidine prep. Right groin skin prepped and draped in usual fashion. Ultrasound image documenting patency of the right common femoral artery was obtained and placed in permanent medical record. Sterile ultrasound probe cover and gel utilized throughout the procedure. Utilizing continuous ultrasound guidance, the right common femoral artery was accessed with a 21 gauge needle. 21 gauge needle exchanged for transitional dilator set over 0.018 inch guidewire. Transitional dilator set exchanged for 5 French sheath over 0.035 inch guidewire. Mickelson catheter utilized to select the superior mesenteric artery. Superior mesenteric arteriogram demonstrated replaced right hepatic artery, patent jejunal, right colic, and middle colic branches. 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 No active extravasation was seen. Mickelson catheter was then  utilized to select the inferior mesenteric artery. Angiogram demonstrated patent left colic artery with some flow to the middle colic arcade through the marginal artery of [place]. STC microcatheter was advanced into the left colic and superior rectal arteries and angiograms were performed. No active extravasation was seen. Mickelson catheter was again utilized to select the superior mesenteric artery. SCC microcatheter advanced into the middle colic artery and angiogram was performed. 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 The microcatheter was advanced into the front door arterial branch and embolization was performed with a 3 mm Ruby coil. Post embolization DSA showed no significant flow to the pseudoaneurysm from the front door branch. Microcatheter was then utilized to select the back door feeder of the pseudoaneurysm. There was significant arterial spasm, therefore 100 mcg of nitro was administered slowly into the branch resulting in significant improvement of spasm. The microcatheter was advanced to the level of the pseudoaneurysm and embolization was performed with 3 and 4 mm Ruby coils. Post embolization angiogram showed no significant flow to the pseudoaneurysm. Sheath angiogram showed appropriate puncture of the common femoral artery at the level of the femoral head prosthesis. The groin was re-prepped and draped and arteriotomy was closed with 6 [place] Angio-Seal device. 5 minutes of manual compression applied at the access site for additional hemostasis. IMPRESSION: 1. 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 The pseudoaneurysm was successfully coil embolized. 2. No active extravasation or other vascular abnormalities identified on superior or inferior mesenteric angiogram. Electronically Signed   By: Acquanetta Belling M.D.   On: 07/24/2021 11:32   IR EMBO ART  VEN  HEMORR LYMPH EXTRAV  INC GUIDE ROADMAPPING  Result Date: 07/24/2021 INDICATION: 75 year old woman with brisk lower GI bleed presents to IR for mesenteric angiogram and embolization. EXAM: 1. Ultrasound-guided access of right common femoral artery 2. Superior mesenteric angiogram 3. Inferior mesenteric angiogram 4. Left colic angiogram 5. Middle colic  angiogram and pseudoaneurysm embolization MEDICATIONS: None ANESTHESIA/SEDATION: Moderate (conscious) sedation was employed during this procedure. A total of Versed 2 mg and Fentanyl 100 mcg was administered intravenously by the radiology nurse. Total intra-service moderate Sedation Time: 143 minutes. The patient's level of consciousness and vital signs were monitored continuously by radiology nursing throughout the procedure under my direct supervision. CONTRAST:  141 mL Omnipaque 300 FLUOROSCOPY TIME:  Fluoroscopy Time: 40 minutes 42 seconds (3726 mGy). COMPLICATIONS: None immediate. PROCEDURE: Informed consent was obtained from the patient following explanation of the procedure, risks, benefits and alternatives. Specifically, I discussed with the patient that the risks included, but are not limited to bleeding, infection, arterial injury, and bowel infarction requiring surgical resection. The patient understands, agrees and consents for the procedure. All questions were addressed. A time out was performed prior to the initiation of the procedure. Maximal barrier sterile technique utilized including caps, mask, sterile gowns, sterile gloves, large sterile drape, hand hygiene, and chlorhexidine prep. Right groin skin prepped and draped in usual fashion. Ultrasound image documenting patency of the right common femoral artery was obtained and placed in permanent medical record. Sterile ultrasound probe cover and gel utilized throughout the procedure. Utilizing continuous ultrasound guidance, the right common femoral artery was accessed with a 21 gauge needle. 21 gauge  needle exchanged for transitional dilator set over 0.018 inch guidewire. Transitional dilator set exchanged for 5 French sheath over 0.035 inch guidewire. Mickelson catheter utilized to select the superior mesenteric artery. Superior mesenteric arteriogram demonstrated replaced right hepatic artery, patent jejunal, right colic, and middle colic branches. 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 No active extravasation was seen. Mickelson catheter was then utilized to select the inferior mesenteric artery. Angiogram demonstrated patent left colic artery with some flow to the middle colic arcade through the marginal artery of [place]. STC microcatheter was advanced into the left colic and superior rectal arteries and angiograms were performed. No active extravasation was seen. Mickelson catheter was again utilized to select the superior mesenteric artery. SCC microcatheter advanced into the middle colic artery and angiogram was performed. 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 The microcatheter was advanced into the front door arterial branch and embolization was performed with a 3 mm Ruby coil. Post embolization DSA showed no significant flow to the pseudoaneurysm from the front door branch. Microcatheter was then utilized to select the back door feeder of the pseudoaneurysm. There was significant arterial spasm, therefore 100 mcg of nitro was administered slowly into the branch resulting in significant improvement of spasm. The microcatheter was advanced to the level of the pseudoaneurysm and embolization was performed with 3 and 4 mm Ruby coils. Post embolization angiogram showed no significant flow to the pseudoaneurysm. Sheath angiogram showed appropriate puncture of the common femoral artery at the level of the femoral head prosthesis. The groin was re-prepped and draped and arteriotomy was closed with 6 [place] Angio-Seal device. 5  minutes of manual compression applied at the access site for additional hemostasis. IMPRESSION: 1. 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 The pseudoaneurysm was successfully coil embolized. 2. No active extravasation or other vascular abnormalities identified on superior or inferior mesenteric angiogram. Electronically Signed   By: Acquanetta Belling M.D.   On: 07/24/2021 11:32    Scheduled Meds:  sodium chloride   Intravenous Once   insulin aspart  0-9 Units Subcutaneous Q4H  pantoprazole  40 mg Oral Daily   rosuvastatin  5 mg Oral QHS   technetium labeled red blood cells  23.3 millicurie Intravenous Once   Continuous Infusions:  sodium chloride 100 mL/hr at 07/24/21 0755      LOS: 5 days   Silvano Bilis, MD [facility] Pager On Amion  If 7PM-7AM, please contact night-coverage 07/24/2021, 11:55 AM

## 2021-07-25 ENCOUNTER — Inpatient Hospital Stay (HOSPITAL_COMMUNITY): Payer: Medicare Other

## 2021-07-25 LAB — BASIC METABOLIC PANEL
Anion gap: 9 (ref 5–15)
BUN: 9 mg/dL (ref 8–23)
CO2: 21 mmol/L — ABNORMAL LOW (ref 22–32)
Calcium: 7.8 mg/dL — ABNORMAL LOW (ref 8.9–10.3)
Chloride: 106 mmol/L (ref 98–111)
Creatinine, Ser: 0.75 mg/dL (ref 0.44–1.00)
GFR, Estimated: >60 mL/min (ref >60)
Glucose, Bld: 132 mg/dL — ABNORMAL HIGH (ref 70–99)
Potassium: 3.8 mmol/L (ref 3.5–5.1)
Sodium: 136 mmol/L (ref 135–145)

## 2021-07-25 LAB — URINALYSIS, ROUTINE W REFLEX MICROSCOPIC
Bilirubin Urine: NEGATIVE
Glucose, UA: NEGATIVE mg/dL
Hgb urine dipstick: NEGATIVE
Ketones, ur: 20 mg/dL — AB
Nitrite: POSITIVE — AB
Protein, ur: NEGATIVE mg/dL
Specific Gravity, Urine: 1.014 (ref 1.005–1.030)
WBC, UA: >50 WBC/hpf — ABNORMAL HIGH (ref 0–5)
pH: 5 (ref 5.0–8.0)

## 2021-07-25 LAB — GLUCOSE, CAPILLARY
Glucose-Capillary: 117 mg/dL — ABNORMAL HIGH (ref 70–99)
Glucose-Capillary: 133 mg/dL — ABNORMAL HIGH (ref 70–99)
Glucose-Capillary: 135 mg/dL — ABNORMAL HIGH (ref 70–99)
Glucose-Capillary: 138 mg/dL — ABNORMAL HIGH (ref 70–99)
Glucose-Capillary: 147 mg/dL — ABNORMAL HIGH (ref 70–99)
Glucose-Capillary: 187 mg/dL — ABNORMAL HIGH (ref 70–99)

## 2021-07-25 LAB — RESP PANEL BY RT-PCR (FLU A&B, COVID) ARPGX2
Influenza A by PCR: NEGATIVE
Influenza B by PCR: NEGATIVE
SARS Coronavirus 2 by RT PCR: NEGATIVE

## 2021-07-25 LAB — CBC
HCT: 22.3 % — ABNORMAL LOW (ref 36.0–46.0)
Hemoglobin: 7.3 g/dL — ABNORMAL LOW (ref 12.0–15.0)
MCH: 31.6 pg (ref 26.0–34.0)
MCHC: 32.7 g/dL (ref 30.0–36.0)
MCV: 96.5 fL (ref 80.0–100.0)
Platelets: 254 10*3/uL (ref 150–400)
RBC: 2.31 MIL/uL — ABNORMAL LOW (ref 3.87–5.11)
RDW: 16 % — ABNORMAL HIGH (ref 11.5–15.5)
WBC: 14.4 10*3/uL — ABNORMAL HIGH (ref 4.0–10.5)
nRBC: 0 % (ref 0.0–0.2)

## 2021-07-25 LAB — HEMOGLOBIN
Hemoglobin: 7.2 g/dL — ABNORMAL LOW (ref 12.0–15.0)
Hemoglobin: 7 g/dL — ABNORMAL LOW (ref 12.0–15.0)

## 2021-07-25 LAB — LACTIC ACID, PLASMA: Lactic Acid, Venous: 0.8 mmol/L (ref 0.5–1.9)

## 2021-07-25 MED ORDER — SODIUM CHLORIDE 0.9 % IV SOLN
1.0000 g | INTRAVENOUS | Status: DC
  Administered 2021-07-25: 1 g via INTRAVENOUS
  Filled 2021-07-25: qty 10

## 2021-07-25 NOTE — Progress Notes (Addendum)
PROGRESS NOTE    Melissa [name]  ZOX:[id number] DOB: 1946-01-11 DOA: 07/18/2021 PCP: Marden Noble, MD    Brief Narrative:  75 y.o. female with history of prior GI bleed in 2017 and subsequent colonoscopy in 2018 was unremarkable with history of diabetes mellitus hypertension and hyperlipidemia presents to the ER with complaints of having multiple episodes of bleeding per rectum bright blood per rectum.  Denies any abdominal pain vomiting. In the ED, CTA without active bleed seen. Presenting hgb 12.7  Assessment & Plan:   Principal Problem:   Acute GI bleeding Active Problems:   Essential hypertension   Diabetes mellitus with complication (HCC)   Acute blood loss anemia   Acute GI bleed likely diverticular origin given the CT scan that showed diverticulosis Aspirin currently on hold Initial CTA neg for bleed but f/u nuc med pos for bleed at splenic flexure on 07/19/21. IR was consulted but per prior provider note, pt declined embolization at that time (husband says embolization was advised by an NP and they don't trust NPs).  Hgb cont to trend down with continued bleeding Pt then agreeable to embolization. Prior provider disucssed with IR who recommended repeat CTA for bleed, if neg, then plan on colonoscopy. It was neg so colonoscopy performed 11/23, copious blood in colon, no source identified Received 2 units 11/22 for hgb drop to 6s. Continued to have bloody BMs on 11/23 after colonoscopy but hgb stable at 8. Overnight no hematochezia but it returned morning of 11/24 and continued throughout the day. Hgb 6.7 to start the day, transfused 3 units (has now received total 5 units).  Tagged rbc scan 11/24 shows bleeding @ splenic flexure. IR consulted, performed angiography with embolization to middle colic artery late evening of 11/24. Also advised transfusing one unit of platelets which was ordered. Since procedure bleeding has slowed. Had one dark bloody BM yesterday and one this  morning. Unfortunately she reports another brighter red BM this morning. Hgb this morning 7.3 After procedure has developed 8/10 periumbilical pain. Discussed w/ GI and IR who advises gen surg consult. Abdominal pain has resolved and gen surg has seen, do not think this is significant ischemia, they have signed off Will continue to monitor hgb and bleeding and will notify GI  Maintain 2 PIVs w/ nursing Clear liquid diet Fever This afternoon temp to 102.1. Has mild abdominal distention, otherwise asymptomatic. Stools are loose and mostly bloody. WBCs are up at 14.4 Will check covid/flu, cxr, blood cultures, urinalysis, c diff, and stool pathogen panel Discussed w/ GI (Outlaw). Fever can be seen w/ bowel infarction. Would check CT of abdomen if w/u neg and fevers persist, particularly if abdominal pain returns History of hypertension  BP stable Continued on PRN hydralazine Given downtrending hgb, would cont to hold home bp meds at this time Diabetes mellitus type 2  Cont with SSI coverage as needed Glycemic trends stable Hold oral hyperglycemic meds while in hospital History of hyperlipidemia. Cholesterol meds currently on hold Would resume when acute blood loss anemia is stabilized/resolved  DVT prophylaxis: SCD's Code Status: Full Family Communication: husband updated @ bedside 11/26  Status is: Inpatient  Requires inpatient because: severity of illness    Consultants:  GI IR  Procedures:  IR angiography with embolization 11/24  Antimicrobials: Anti-infectives (From admission, onward)    None       Subjective: Abd pain resolved but says abd feels a bit bloated. Had more red BM just prior to my arrival. Doesn't feel feverish  Objective: Vitals:   07/25/21 0400 07/25/21 1154 07/25/21 1202 07/25/21 1304  BP: (!) 139/52 (!) 134/52    Pulse: 96 98    Resp: (!) 21 (!) 22    Temp: 99.3 F (37.4 C) (!) 102.1 F (38.9 C) (!) 100.9 F (38.3 C) 98.3 F (36.8 C)   TempSrc: Oral Oral Oral Oral  SpO2: 95% 93%    Weight:      Height:        Intake/Output Summary (Last 24 hours) at 07/25/2021 1349 Last data filed at 07/25/2021 1006 Gross per 24 hour  Intake 1010.78 ml  Output --  Net 1010.78 ml   Filed Weights   07/18/21 1051  Weight: 87.1 kg    Examination: General exam: Awake, laying in bed, in nad Respiratory system: Normal respiratory effort, no wheezing Cardiovascular system: regular rate, s1, s2 Gastrointestinal system: Soft, mild ttp throughout Central nervous system: CN2-12 grossly intact, strength intact Extremities: Perfused, no clubbing Skin: Normal skin turgor, no notable skin lesions seen Psychiatry: Mood normal // no visual hallucinations   Data Reviewed: I have personally reviewed following labs and imaging studies  CBC: Recent Labs  Lab 07/21/21 1526 07/22/21 0638 07/22/21 1358 07/22/21 1913 07/23/21 0511 07/23/21 1140 07/24/21 0032 07/24/21 0618 07/24/21 1243 07/24/21 2214 07/25/21 0510  WBC 6.4  --  6.9  --  5.1  --  6.4  --   --   --  14.4*  HGB 6.4* 8.3* 8.0*   < > 6.7*   < > 7.7*  7.8* 8.5* 8.0* 7.5* 7.3*  HCT 20.1* 25.2* 24.4*  --  20.4*  --  24.4*  --   --   --  22.3*  MCV 100.5*  --  95.3  --  97.6  --  97.6  --   --   --  96.5  PLT 230  --  227  --  211  --  194  --   --   --  254   < > = values in this interval not displayed.   Basic Metabolic Panel: Recent Labs  Lab 07/23/21 0511 07/24/21 0032 07/24/21 1243 07/25/21 0510  NA 140 138  --  136  K 3.7 3.4*  --  3.8  CL 110 108  --  106  CO2 24 22  --  21*  GLUCOSE 152* 150*  --  132*  BUN 9 8  --  9  CREATININE 0.61 0.66  --  0.75  CALCIUM 8.3* 8.2*  --  7.8*  MG  --   --  1.6*  --    GFR: Estimated Creatinine Clearance: 61.6 mL/min (by C-G formula based on SCr of 0.75 mg/dL). Liver Function Tests: No results for input(s): AST, ALT, ALKPHOS, BILITOT, PROT, ALBUMIN in the last 168 hours.  No results for input(s): LIPASE, AMYLASE in  the last 168 hours. No results for input(s): AMMONIA in the last 168 hours. Coagulation Profile: Recent Labs  Lab 07/24/21 0032  INR 1.1   Cardiac Enzymes: No results for input(s): CKTOTAL, CKMB, CKMBINDEX, TROPONINI in the last 168 hours. BNP (last 3 results) No results for input(s): PROBNP in the last 8760 hours. HbA1C: No results for input(s): HGBA1C in the last 72 hours. CBG: Recent Labs  Lab 07/24/21 1721 07/24/21 2045 07/25/21 0008 07/25/21 0412 07/25/21 0741  GLUCAP 115* 143* 133* 138* 135*   Lipid Profile: No results for input(s): CHOL, HDL, LDLCALC, TRIG, CHOLHDL, LDLDIRECT in the last 72 hours. Thyroid Function  Tests: No results for input(s): TSH, T4TOTAL, FREET4, T3FREE, THYROIDAB in the last 72 hours. Anemia Panel: No results for input(s): VITAMINB12, FOLATE, FERRITIN, TIBC, IRON, RETICCTPCT in the last 72 hours. Sepsis Labs: Recent Labs  Lab 07/24/21 1243 07/25/21 0510  LATICACIDVEN 0.9 0.8    Recent Results (from the past 240 hour(s))  Resp Panel by RT-PCR (Flu A&B, Covid) Nasopharyngeal Swab     Status: None   Collection Time: 07/18/21  6:30 PM   Specimen: Nasopharyngeal Swab; Nasopharyngeal(NP) swabs in vial transport medium  Result Value Ref Range Status   SARS Coronavirus 2 by RT PCR NEGATIVE NEGATIVE Final    Comment: (NOTE) SARS-CoV-2 target nucleic acids are NOT DETECTED.  The SARS-CoV-2 RNA is generally detectable in upper respiratory specimens during the acute phase of infection. The lowest concentration of SARS-CoV-2 viral copies this assay can detect is 138 copies/mL. A negative result does not preclude SARS-Cov-2 infection and should not be used as the sole basis for treatment or other patient management decisions. A negative result may occur with  improper specimen collection/handling, submission of specimen other than nasopharyngeal swab, presence of viral mutation(s) within the areas targeted by this assay, and inadequate number of  viral copies(<138 copies/mL). A negative result must be combined with clinical observations, patient history, and epidemiological information. The expected result is Negative.  Fact Sheet for Patients:  BloggerCourse.com  Fact Sheet for Healthcare Providers:  SeriousBroker.it  This test is no t yet approved or cleared by the [place] FDA and  has been authorized for detection and/or diagnosis of SARS-CoV-2 by FDA under an Emergency Use Authorization (EUA). This EUA will remain  in effect (meaning this test can be used) for the duration of the COVID-19 declaration under Section 564(b)(1) of the Act, 21 U.S.C.section 360bbb-3(b)(1), unless the authorization is terminated  or revoked sooner.       Influenza A by PCR NEGATIVE NEGATIVE Final   Influenza B by PCR NEGATIVE NEGATIVE Final    Comment: (NOTE) The Xpert Xpress SARS-CoV-2/FLU/RSV plus assay is intended as an aid in the diagnosis of influenza from Nasopharyngeal swab specimens and should not be used as a sole basis for treatment. Nasal washings and aspirates are unacceptable for Xpert Xpress SARS-CoV-2/FLU/RSV testing.  Fact Sheet for Patients: BloggerCourse.com  Fact Sheet for Healthcare Providers: SeriousBroker.it  This test is not yet approved or cleared by the [place] FDA and has been authorized for detection and/or diagnosis of SARS-CoV-2 by FDA under an Emergency Use Authorization (EUA). This EUA will remain in effect (meaning this test can be used) for the duration of the COVID-19 declaration under Section 564(b)(1) of the Act, 21 U.S.C. section 360bbb-3(b)(1), unless the authorization is terminated or revoked.  Performed at [facility], [address], [place], [place] [address]       Radiology Studies: NM GI Blood Loss  Result Date: 07/23/2021 CLINICAL DATA:  75 year old  female with GI bleeding. EXAM: NUCLEAR MEDICINE GASTROINTESTINAL BLEEDING SCAN TECHNIQUE: Sequential abdominal images were obtained following intravenous administration of Tc-33m labeled red blood cells. RADIOPHARMACEUTICALS:  23.3 mCi Tc-12m pertechnetate in-vitro labeled red cells. COMPARISON:  07/21/2021 CTA, 07/19/2021 nuclear medicine GI bleeding study and prior exams FINDINGS: There is abnormal activity within a tubular structure within the mid abdomen compatible with active GI bleeding within the transverse colon, likely in the more distal aspect of the transverse colon/splenic flexure. This abnormal activity slightly progresses towards the end of the study. IMPRESSION: Active colonic GI bleeding with  probable bleeding site in the more distal aspect of the transverse colon/splenic flexure. Electronically Signed   By: Harmon Pier M.D.   On: 07/23/2021 15:13   IR Angiogram Visceral Selective  Result Date: 07/24/2021 INDICATION: 75 year old woman with brisk lower GI bleed presents to IR for mesenteric angiogram and embolization. EXAM: 1. Ultrasound-guided access of right common femoral artery 2. Superior mesenteric angiogram 3. Inferior mesenteric angiogram 4. Left colic angiogram 5. Middle colic angiogram and pseudoaneurysm embolization MEDICATIONS: None ANESTHESIA/SEDATION: Moderate (conscious) sedation was employed during this procedure. A total of Versed 2 mg and Fentanyl 100 mcg was administered intravenously by the radiology nurse. Total intra-service moderate Sedation Time: 143 minutes. The patient's level of consciousness and vital signs were monitored continuously by radiology nursing throughout the procedure under my direct supervision. CONTRAST:  141 mL Omnipaque 300 FLUOROSCOPY TIME:  Fluoroscopy Time: 40 minutes 42 seconds (3726 mGy). COMPLICATIONS: None immediate. PROCEDURE: Informed consent was obtained from the patient following explanation of the procedure, risks, benefits and alternatives.  Specifically, I discussed with the patient that the risks included, but are not limited to bleeding, infection, arterial injury, and bowel infarction requiring surgical resection. The patient understands, agrees and consents for the procedure. All questions were addressed. A time out was performed prior to the initiation of the procedure. Maximal barrier sterile technique utilized including caps, mask, sterile gowns, sterile gloves, large sterile drape, hand hygiene, and chlorhexidine prep. Right groin skin prepped and draped in usual fashion. Ultrasound image documenting patency of the right common femoral artery was obtained and placed in permanent medical record. Sterile ultrasound probe cover and gel utilized throughout the procedure. Utilizing continuous ultrasound guidance, the right common femoral artery was accessed with a 21 gauge needle. 21 gauge needle exchanged for transitional dilator set over 0.018 inch guidewire. Transitional dilator set exchanged for 5 French sheath over 0.035 inch guidewire. Mickelson catheter utilized to select the superior mesenteric artery. Superior mesenteric arteriogram demonstrated replaced right hepatic artery, patent jejunal, right colic, and middle colic branches. 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 No active extravasation was seen. Mickelson catheter was then utilized to select the inferior mesenteric artery. Angiogram demonstrated patent left colic artery with some flow to the middle colic arcade through the marginal artery of [place]. STC microcatheter was advanced into the left colic and superior rectal arteries and angiograms were performed. No active extravasation was seen. Mickelson catheter was again utilized to select the superior mesenteric artery. SCC microcatheter advanced into the middle colic artery and angiogram was performed. 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 The  microcatheter was advanced into the front door arterial branch and embolization was performed with a 3 mm Ruby coil. Post embolization DSA showed no significant flow to the pseudoaneurysm from the front door branch. Microcatheter was then utilized to select the back door feeder of the pseudoaneurysm. There was significant arterial spasm, therefore 100 mcg of nitro was administered slowly into the branch resulting in significant improvement of spasm. The microcatheter was advanced to the level of the pseudoaneurysm and embolization was performed with 3 and 4 mm Ruby coils. Post embolization angiogram showed no significant flow to the pseudoaneurysm. Sheath angiogram showed appropriate puncture of the common femoral artery at the level of the femoral head prosthesis. The groin was re-prepped and draped and arteriotomy was closed with 6 [place] Angio-Seal device. 5 minutes of manual compression applied at the access site for additional hemostasis. IMPRESSION:  1. 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 The pseudoaneurysm was successfully coil embolized. 2. No active extravasation or other vascular abnormalities identified on superior or inferior mesenteric angiogram. Electronically Signed   By: Acquanetta Belling M.D.   On: 07/24/2021 11:32   IR Angiogram Visceral Selective  Result Date: 07/24/2021 INDICATION: 75 year old woman with brisk lower GI bleed presents to IR for mesenteric angiogram and embolization. EXAM: 1. Ultrasound-guided access of right common femoral artery 2. Superior mesenteric angiogram 3. Inferior mesenteric angiogram 4. Left colic angiogram 5. Middle colic angiogram and pseudoaneurysm embolization MEDICATIONS: None ANESTHESIA/SEDATION: Moderate (conscious) sedation was employed during this procedure. A total of Versed 2 mg and Fentanyl 100 mcg was administered intravenously by the radiology nurse.  Total intra-service moderate Sedation Time: 143 minutes. The patient's level of consciousness and vital signs were monitored continuously by radiology nursing throughout the procedure under my direct supervision. CONTRAST:  141 mL Omnipaque 300 FLUOROSCOPY TIME:  Fluoroscopy Time: 40 minutes 42 seconds (3726 mGy). COMPLICATIONS: None immediate. PROCEDURE: Informed consent was obtained from the patient following explanation of the procedure, risks, benefits and alternatives. Specifically, I discussed with the patient that the risks included, but are not limited to bleeding, infection, arterial injury, and bowel infarction requiring surgical resection. The patient understands, agrees and consents for the procedure. All questions were addressed. A time out was performed prior to the initiation of the procedure. Maximal barrier sterile technique utilized including caps, mask, sterile gowns, sterile gloves, large sterile drape, hand hygiene, and chlorhexidine prep. Right groin skin prepped and draped in usual fashion. Ultrasound image documenting patency of the right common femoral artery was obtained and placed in permanent medical record. Sterile ultrasound probe cover and gel utilized throughout the procedure. Utilizing continuous ultrasound guidance, the right common femoral artery was accessed with a 21 gauge needle. 21 gauge needle exchanged for transitional dilator set over 0.018 inch guidewire. Transitional dilator set exchanged for 5 French sheath over 0.035 inch guidewire. Mickelson catheter utilized to select the superior mesenteric artery. Superior mesenteric arteriogram demonstrated replaced right hepatic artery, patent jejunal, right colic, and middle colic branches. 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 No active extravasation was seen. Mickelson catheter was then utilized to select the inferior mesenteric artery. Angiogram demonstrated patent left colic artery  with some flow to the middle colic arcade through the marginal artery of [place]. STC microcatheter was advanced into the left colic and superior rectal arteries and angiograms were performed. No active extravasation was seen. Mickelson catheter was again utilized to select the superior mesenteric artery. SCC microcatheter advanced into the middle colic artery and angiogram was performed. 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 The microcatheter was advanced into the front door arterial branch and embolization was performed with a 3 mm Ruby coil. Post embolization DSA showed no significant flow to the pseudoaneurysm from the front door branch. Microcatheter was then utilized to select the back door feeder of the pseudoaneurysm. There was significant arterial spasm, therefore 100 mcg of nitro was administered slowly into the branch resulting in significant improvement of spasm. The microcatheter was advanced to the level of the pseudoaneurysm and embolization was performed with 3 and 4 mm Ruby coils. Post embolization angiogram showed no significant flow to the pseudoaneurysm. Sheath angiogram showed appropriate puncture of the common femoral artery at the level of the femoral head prosthesis. The groin  was re-prepped and draped and arteriotomy was closed with 6 [place] Angio-Seal device. 5 minutes of manual compression applied at the access site for additional hemostasis. IMPRESSION: 1. 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 The pseudoaneurysm was successfully coil embolized. 2. No active extravasation or other vascular abnormalities identified on superior or inferior mesenteric angiogram. Electronically Signed   By: Acquanetta Belling M.D.   On: 07/24/2021 11:32   IR Angiogram Selective Each Additional Vessel  Result Date: 07/24/2021 INDICATION: 75 year old woman with brisk  lower GI bleed presents to IR for mesenteric angiogram and embolization. EXAM: 1. Ultrasound-guided access of right common femoral artery 2. Superior mesenteric angiogram 3. Inferior mesenteric angiogram 4. Left colic angiogram 5. Middle colic angiogram and pseudoaneurysm embolization MEDICATIONS: None ANESTHESIA/SEDATION: Moderate (conscious) sedation was employed during this procedure. A total of Versed 2 mg and Fentanyl 100 mcg was administered intravenously by the radiology nurse. Total intra-service moderate Sedation Time: 143 minutes. The patient's level of consciousness and vital signs were monitored continuously by radiology nursing throughout the procedure under my direct supervision. CONTRAST:  141 mL Omnipaque 300 FLUOROSCOPY TIME:  Fluoroscopy Time: 40 minutes 42 seconds (3726 mGy). COMPLICATIONS: None immediate. PROCEDURE: Informed consent was obtained from the patient following explanation of the procedure, risks, benefits and alternatives. Specifically, I discussed with the patient that the risks included, but are not limited to bleeding, infection, arterial injury, and bowel infarction requiring surgical resection. The patient understands, agrees and consents for the procedure. All questions were addressed. A time out was performed prior to the initiation of the procedure. Maximal barrier sterile technique utilized including caps, mask, sterile gowns, sterile gloves, large sterile drape, hand hygiene, and chlorhexidine prep. Right groin skin prepped and draped in usual fashion. Ultrasound image documenting patency of the right common femoral artery was obtained and placed in permanent medical record. Sterile ultrasound probe cover and gel utilized throughout the procedure. Utilizing continuous ultrasound guidance, the right common femoral artery was accessed with a 21 gauge needle. 21 gauge needle exchanged for transitional dilator set over 0.018 inch guidewire. Transitional dilator set exchanged for  5 French sheath over 0.035 inch guidewire. Mickelson catheter utilized to select the superior mesenteric artery. Superior mesenteric arteriogram demonstrated replaced right hepatic artery, patent jejunal, right colic, and middle colic branches. 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 No active extravasation was seen. Mickelson catheter was then utilized to select the inferior mesenteric artery. Angiogram demonstrated patent left colic artery with some flow to the middle colic arcade through the marginal artery of [place]. STC microcatheter was advanced into the left colic and superior rectal arteries and angiograms were performed. No active extravasation was seen. Mickelson catheter was again utilized to select the superior mesenteric artery. SCC microcatheter advanced into the middle colic artery and angiogram was performed. 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 The microcatheter was advanced into the front door arterial branch and embolization was performed with a 3 mm Ruby coil. Post embolization DSA showed no significant flow to the pseudoaneurysm from the front door branch. Microcatheter was then utilized to select the back door feeder of the pseudoaneurysm. There was significant arterial spasm, therefore 100 mcg of nitro was administered slowly into the branch resulting in significant improvement of spasm. The microcatheter was advanced to the level of the pseudoaneurysm and embolization was performed with 3 and 4 mm Ruby coils. Post  embolization angiogram showed no significant flow to the pseudoaneurysm. Sheath angiogram showed appropriate puncture of the common femoral artery at the level of the femoral head prosthesis. The groin was re-prepped and draped and arteriotomy was closed with 6 [place] Angio-Seal device. 5 minutes of manual compression applied at the access site for additional hemostasis. IMPRESSION: 1. Mesenteric  angiogram demonstrates small pseudoaneurysm originating from branches of the middle colic artery near the proximal transverse colon. The pseudoaneurysm was successfully coil embolized. 2. No active extravasation or other vascular abnormalities identified on superior or inferior mesenteric angiogram. Electronically Signed   By: Acquanetta Belling M.D.   On: 07/24/2021 11:32   IR US Guide Vasc Access Right  Result Date: 07/24/2021 INDICATION: 75 year old woman with brisk lower GI bleed presents to IR for mesenteric angiogram and embolization. EXAM: 1. Ultrasound-guided access of right common femoral artery 2. Superior mesenteric angiogram 3. Inferior mesenteric angiogram 4. Left colic angiogram 5. Middle colic angiogram and pseudoaneurysm embolization MEDICATIONS: None ANESTHESIA/SEDATION: Moderate (conscious) sedation was employed during this procedure. A total of Versed 2 mg and Fentanyl 100 mcg was administered intravenously by the radiology nurse. Total intra-service moderate Sedation Time: 143 minutes. The patient's level of consciousness and vital signs were monitored continuously by radiology nursing throughout the procedure under my direct supervision. CONTRAST:  141 mL Omnipaque 300 FLUOROSCOPY TIME:  Fluoroscopy Time: 40 minutes 42 seconds (3726 mGy). COMPLICATIONS: None immediate. PROCEDURE: Informed consent was obtained from the patient following explanation of the procedure, risks, benefits and alternatives. Specifically, I discussed with the patient that the risks included, but are not limited to bleeding, infection, arterial injury, and bowel infarction requiring surgical resection. The patient understands, agrees and consents for the procedure. All questions were addressed. A time out was performed prior to the initiation of the procedure. Maximal barrier sterile technique utilized including caps, mask, sterile gowns, sterile gloves, large sterile drape, hand hygiene, and chlorhexidine prep. Right groin  skin prepped and draped in usual fashion. Ultrasound image documenting patency of the right common femoral artery was obtained and placed in permanent medical record. Sterile ultrasound probe cover and gel utilized throughout the procedure. Utilizing continuous ultrasound guidance, the right common femoral artery was accessed with a 21 gauge needle. 21 gauge needle exchanged for transitional dilator set over 0.018 inch guidewire. Transitional dilator set exchanged for 5 French sheath over 0.035 inch guidewire. Mickelson catheter utilized to select the superior mesenteric artery. Superior mesenteric arteriogram demonstrated replaced right hepatic artery, patent jejunal, right colic, and middle colic branches. 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 No active extravasation was seen. Mickelson catheter was then utilized to select the inferior mesenteric artery. Angiogram demonstrated patent left colic artery with some flow to the middle colic arcade through the marginal artery of [place]. STC microcatheter was advanced into the left colic and superior rectal arteries and angiograms were performed. No active extravasation was seen. Mickelson catheter was again utilized to select the superior mesenteric artery. SCC microcatheter advanced into the middle colic artery and angiogram was performed. 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 The microcatheter was advanced into the front door arterial branch and embolization was performed with a 3 mm Ruby coil. Post embolization DSA showed no significant flow to the pseudoaneurysm from the front door branch. Microcatheter was then utilized to select the back door feeder of the pseudoaneurysm. There was significant arterial spasm, therefore 100 mcg of nitro was administered slowly into the  branch resulting in significant improvement of spasm. The microcatheter was advanced to the level of the  pseudoaneurysm and embolization was performed with 3 and 4 mm Ruby coils. Post embolization angiogram showed no significant flow to the pseudoaneurysm. Sheath angiogram showed appropriate puncture of the common femoral artery at the level of the femoral head prosthesis. The groin was re-prepped and draped and arteriotomy was closed with 6 [place] Angio-Seal device. 5 minutes of manual compression applied at the access site for additional hemostasis. IMPRESSION: 1. 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 The pseudoaneurysm was successfully coil embolized. 2. No active extravasation or other vascular abnormalities identified on superior or inferior mesenteric angiogram. Electronically Signed   By: Acquanetta Belling M.D.   On: 07/24/2021 11:32   IR EMBO ART  VEN HEMORR LYMPH EXTRAV  INC GUIDE ROADMAPPING  Result Date: 07/24/2021 INDICATION: 76 year old woman with brisk lower GI bleed presents to IR for mesenteric angiogram and embolization. EXAM: 1. Ultrasound-guided access of right common femoral artery 2. Superior mesenteric angiogram 3. Inferior mesenteric angiogram 4. Left colic angiogram 5. Middle colic angiogram and pseudoaneurysm embolization MEDICATIONS: None ANESTHESIA/SEDATION: Moderate (conscious) sedation was employed during this procedure. A total of Versed 2 mg and Fentanyl 100 mcg was administered intravenously by the radiology nurse. Total intra-service moderate Sedation Time: 143 minutes. The patient's level of consciousness and vital signs were monitored continuously by radiology nursing throughout the procedure under my direct supervision. CONTRAST:  141 mL Omnipaque 300 FLUOROSCOPY TIME:  Fluoroscopy Time: 40 minutes 42 seconds (3726 mGy). COMPLICATIONS: None immediate. PROCEDURE: Informed consent was obtained from the patient following explanation of the procedure, risks, benefits and  alternatives. Specifically, I discussed with the patient that the risks included, but are not limited to bleeding, infection, arterial injury, and bowel infarction requiring surgical resection. The patient understands, agrees and consents for the procedure. All questions were addressed. A time out was performed prior to the initiation of the procedure. Maximal barrier sterile technique utilized including caps, mask, sterile gowns, sterile gloves, large sterile drape, hand hygiene, and chlorhexidine prep. Right groin skin prepped and draped in usual fashion. Ultrasound image documenting patency of the right common femoral artery was obtained and placed in permanent medical record. Sterile ultrasound probe cover and gel utilized throughout the procedure. Utilizing continuous ultrasound guidance, the right common femoral artery was accessed with a 21 gauge needle. 21 gauge needle exchanged for transitional dilator set over 0.018 inch guidewire. Transitional dilator set exchanged for 5 French sheath over 0.035 inch guidewire. Mickelson catheter utilized to select the superior mesenteric artery. Superior mesenteric arteriogram demonstrated replaced right hepatic artery, patent jejunal, right colic, and middle colic branches. 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 No active extravasation was seen. Mickelson catheter was then utilized to select the inferior mesenteric artery. Angiogram demonstrated patent left colic artery with some flow to the middle colic arcade through the marginal artery of [place]. STC microcatheter was advanced into the left colic and superior rectal arteries and angiograms were performed. No active extravasation was seen. Mickelson catheter was again utilized to select the superior mesenteric artery. SCC microcatheter advanced into the middle colic artery and angiogram was performed. This again showed in the pseudoaneurysm near the proximal transverse  colon. The microcatheter was advanced into the front door arterial branch and embolization was performed with a 3 mm Ruby coil. Post embolization DSA showed no significant flow  to the pseudoaneurysm from the front door branch. Microcatheter was then utilized to select the back door feeder of the pseudoaneurysm. There was significant arterial spasm, therefore 100 mcg of nitro was administered slowly into the branch resulting in significant improvement of spasm. The microcatheter was advanced to the level of the pseudoaneurysm and embolization was performed with 3 and 4 mm Ruby coils. Post embolization angiogram showed no significant flow to the pseudoaneurysm. Sheath angiogram showed appropriate puncture of the common femoral artery at the level of the femoral head prosthesis. The groin was re-prepped and draped and arteriotomy was closed with 6 [place] Angio-Seal device. 5 minutes of manual compression applied at the access site for additional hemostasis. IMPRESSION: 1. 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 The pseudoaneurysm was successfully coil embolized. 2. No active extravasation or other vascular abnormalities identified on superior or inferior mesenteric angiogram. Electronically Signed   By: Acquanetta Belling M.D.   On: 07/24/2021 11:32    Scheduled Meds:  sodium chloride   Intravenous Once   insulin aspart  0-9 Units Subcutaneous Q4H   pantoprazole  40 mg Oral Daily   rosuvastatin  5 mg Oral QHS   technetium labeled red blood cells  23.3 millicurie Intravenous Once   Continuous Infusions:  0.9 % NaCl with KCl 20 mEq / L 100 mL/hr at 07/25/21 0343      LOS: 6 days   Silvano Bilis, MD [facility] Pager On Amion  If 7PM-7AM, please contact night-coverage 07/25/2021, 1:49 PM

## 2021-07-25 NOTE — Progress Notes (Signed)
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 Darnell Level, 1994 Rule out colonic ischemia following embolization procedure             WBC (14.4), potassium (3.8), bicarb (21), lactate (0.8) this AM             Mild diffuse abd pain             Exam benign  Begin clear liquid diet this AM and advance as tolerated.  Ambulate in halls.  Will follow.        Darnell Level, MD       [facility] Surgery       Office: [phone]   Chief Complaint: Lower GI bleeding  Subjective: Patient in bed, comfortable.  Husband at bedside.  Last BM 7PM last night with small old blood.  Objective: Vital signs in last 24 hours: Temp:  [98 F (36.7 C)-99.3 F (37.4 C)] 99.3 F (37.4 C) (11/26 0400) Pulse Rate:  [79-99] 96 (11/26 0400) Resp:  [13-24] 21 (11/26 0400) BP: (127-151)/(48-68) 139/52 (11/26 0400) SpO2:  [92 %-98 %] 95 % (11/26 0400) Last BM Date: 07/24/21  Intake/Output from previous day: 11/25 0701 - 11/26 0700 In: 1226.8 [I.V.:940.8; Blood:276; IV Piggyback:10] Out: -  Intake/Output this shift: No intake/output data recorded.  Physical Exam: HEENT - sclerae clear, mucous membranes moist Neck - soft Abdomen - soft without distension; mild tenderness RUQ; no mass Ext - no edema, non-tender Neuro - alert & oriented, no focal deficits  Lab Results:  Recent Labs    07/24/21 0032 07/24/21 0618 07/24/21 2214 07/25/21 0510  WBC 6.4  --   --  14.4*  HGB 7.7*  7.8*   < > 7.5* 7.3*  HCT 24.4*  --   --  22.3*  PLT 194  --   --  254   < > = values in this interval not displayed.   BMET Recent Labs     07/24/21 0032 07/25/21 0510  NA 138 136  K 3.4* 3.8  CL 108 106  CO2 22 21*  GLUCOSE 150* 132*  BUN 8 9  CREATININE 0.66 0.75  CALCIUM 8.2* 7.8*   PT/INR Recent Labs    07/24/21 0032  LABPROT 14.2  INR 1.1   Comprehensive Metabolic Panel:    Component Value Date/Time   NA 136 07/25/2021 0510   NA 138 07/24/2021 0032   K 3.8 07/25/2021 0510   K 3.4 (L) 07/24/2021 0032   CL 106 07/25/2021 0510   CL 108 07/24/2021 0032   CO2 21 (L) 07/25/2021 0510   CO2 22 07/24/2021 0032   BUN 9 07/25/2021 0510   BUN 8 07/24/2021 0032   CREATININE 0.75 07/25/2021 0510   CREATININE 0.66 07/24/2021 0032   GLUCOSE 132 (H) 07/25/2021 0510   GLUCOSE 150 (H) 07/24/2021 0032   GLUCOSE 120 (H) 08/31/2006 0832   CALCIUM 7.8 (L)  07/25/2021 0510   CALCIUM 8.2 (L) 07/24/2021 0032   AST 23 07/18/2021 1126   AST 40 11/10/2015 0011   ALT 24 07/18/2021 1126   ALT 44 11/10/2015 0011   ALKPHOS 81 07/18/2021 1126   ALKPHOS 54 11/10/2015 0011   BILITOT 0.5 07/18/2021 1126   BILITOT 0.7 11/10/2015 0011   PROT 8.0 07/18/2021 1126   PROT 7.2 11/10/2015 0011   ALBUMIN 4.2 07/18/2021 1126   ALBUMIN 4.2 11/10/2015 0011    Studies/Results: NM GI Blood Loss  Result Date: 07/23/2021 CLINICAL DATA:  76 year old female with GI bleeding. EXAM: NUCLEAR MEDICINE GASTROINTESTINAL BLEEDING SCAN TECHNIQUE: Sequential abdominal images were obtained following intravenous administration of Tc-22m labeled red blood cells. RADIOPHARMACEUTICALS:  23.3 mCi Tc-21m pertechnetate in-vitro labeled red cells. COMPARISON:  07/21/2021 CTA, 07/19/2021 nuclear medicine GI bleeding study and prior exams FINDINGS: There is abnormal activity within a tubular structure within the mid abdomen compatible with active GI bleeding within the transverse colon, likely in the more distal aspect of the transverse colon/splenic flexure. This abnormal activity slightly progresses towards the end of the study. IMPRESSION: Active colonic GI  bleeding with probable bleeding site in the more distal aspect of the transverse colon/splenic flexure. Electronically Signed   By: Harmon Pier M.D.   On: 07/23/2021 15:13   IR Angiogram Visceral Selective  Result Date: 07/24/2021 INDICATION: 75 year old woman with brisk lower GI bleed presents to IR for mesenteric angiogram and embolization. EXAM: 1. Ultrasound-guided access of right common femoral artery 2. Superior mesenteric angiogram 3. Inferior mesenteric angiogram 4. Left colic angiogram 5. Middle colic angiogram and pseudoaneurysm embolization MEDICATIONS: None ANESTHESIA/SEDATION: Moderate (conscious) sedation was employed during this procedure. A total of Versed 2 mg and Fentanyl 100 mcg was administered intravenously by the radiology nurse. Total intra-service moderate Sedation Time: 143 minutes. The patient's level of consciousness and vital signs were monitored continuously by radiology nursing throughout the procedure under my direct supervision. CONTRAST:  141 mL Omnipaque 300 FLUOROSCOPY TIME:  Fluoroscopy Time: 40 minutes 42 seconds (3726 mGy). COMPLICATIONS: None immediate. PROCEDURE: Informed consent was obtained from the patient following explanation of the procedure, risks, benefits and alternatives. Specifically, I discussed with the patient that the risks included, but are not limited to bleeding, infection, arterial injury, and bowel infarction requiring surgical resection. The patient understands, agrees and consents for the procedure. All questions were addressed. A time out was performed prior to the initiation of the procedure. Maximal barrier sterile technique utilized including caps, mask, sterile gowns, sterile gloves, large sterile drape, hand hygiene, and chlorhexidine prep. Right groin skin prepped and draped in usual fashion. Ultrasound image documenting patency of the right common femoral artery was obtained and placed in permanent medical record. Sterile ultrasound probe  cover and gel utilized throughout the procedure. Utilizing continuous ultrasound guidance, the right common femoral artery was accessed with a 21 gauge needle. 21 gauge needle exchanged for transitional dilator set over 0.018 inch guidewire. Transitional dilator set exchanged for 5 French sheath over 0.035 inch guidewire. Mickelson catheter utilized to select the superior mesenteric artery. Superior mesenteric arteriogram demonstrated replaced right hepatic artery, patent jejunal, right colic, and middle colic branches. 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 No active extravasation was seen. Mickelson catheter was then utilized to select the inferior mesenteric artery. Angiogram demonstrated patent left colic artery with some flow to the middle colic arcade through the marginal artery of [place]. STC microcatheter was advanced into the left colic  and superior rectal arteries and angiograms were performed. No active extravasation was seen. Mickelson catheter was again utilized to select the superior mesenteric artery. SCC microcatheter advanced into the middle colic artery and angiogram was performed. 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 The microcatheter was advanced into the front door arterial branch and embolization was performed with a 3 mm Ruby coil. Post embolization DSA showed no significant flow to the pseudoaneurysm from the front door branch. Microcatheter was then utilized to select the back door feeder of the pseudoaneurysm. There was significant arterial spasm, therefore 100 mcg of nitro was administered slowly into the branch resulting in significant improvement of spasm. The microcatheter was advanced to the level of the pseudoaneurysm and embolization was performed with 3 and 4 mm Ruby coils. Post embolization angiogram showed no significant flow to the pseudoaneurysm. Sheath angiogram showed appropriate puncture of  the common femoral artery at the level of the femoral head prosthesis. The groin was re-prepped and draped and arteriotomy was closed with 6 [place] Angio-Seal device. 5 minutes of manual compression applied at the access site for additional hemostasis. IMPRESSION: 1. 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 The pseudoaneurysm was successfully coil embolized. 2. No active extravasation or other vascular abnormalities identified on superior or inferior mesenteric angiogram. Electronically Signed   By: Acquanetta Belling M.D.   On: 07/24/2021 11:32   IR Angiogram Visceral Selective  Result Date: 07/24/2021 INDICATION: 75 year old woman with brisk lower GI bleed presents to IR for mesenteric angiogram and embolization. EXAM: 1. Ultrasound-guided access of right common femoral artery 2. Superior mesenteric angiogram 3. Inferior mesenteric angiogram 4. Left colic angiogram 5. Middle colic angiogram and pseudoaneurysm embolization MEDICATIONS: None ANESTHESIA/SEDATION: Moderate (conscious) sedation was employed during this procedure. A total of Versed 2 mg and Fentanyl 100 mcg was administered intravenously by the radiology nurse. Total intra-service moderate Sedation Time: 143 minutes. The patient's level of consciousness and vital signs were monitored continuously by radiology nursing throughout the procedure under my direct supervision. CONTRAST:  141 mL Omnipaque 300 FLUOROSCOPY TIME:  Fluoroscopy Time: 40 minutes 42 seconds (3726 mGy). COMPLICATIONS: None immediate. PROCEDURE: Informed consent was obtained from the patient following explanation of the procedure, risks, benefits and alternatives. Specifically, I discussed with the patient that the risks included, but are not limited to bleeding, infection, arterial injury, and bowel infarction requiring surgical resection. The patient understands, agrees and consents  for the procedure. All questions were addressed. A time out was performed prior to the initiation of the procedure. Maximal barrier sterile technique utilized including caps, mask, sterile gowns, sterile gloves, large sterile drape, hand hygiene, and chlorhexidine prep. Right groin skin prepped and draped in usual fashion. Ultrasound image documenting patency of the right common femoral artery was obtained and placed in permanent medical record. Sterile ultrasound probe cover and gel utilized throughout the procedure. Utilizing continuous ultrasound guidance, the right common femoral artery was accessed with a 21 gauge needle. 21 gauge needle exchanged for transitional dilator set over 0.018 inch guidewire. Transitional dilator set exchanged for 5 French sheath over 0.035 inch guidewire. Mickelson catheter utilized to select the superior mesenteric artery. Superior mesenteric arteriogram demonstrated replaced right hepatic artery, patent jejunal, right colic, and middle colic branches. 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 No active extravasation was seen. Mickelson catheter was then utilized to select the inferior mesenteric artery. Angiogram  demonstrated patent left colic artery with some flow to the middle colic arcade through the marginal artery of [place]. STC microcatheter was advanced into the left colic and superior rectal arteries and angiograms were performed. No active extravasation was seen. Mickelson catheter was again utilized to select the superior mesenteric artery. SCC microcatheter advanced into the middle colic artery and angiogram was performed. 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 The microcatheter was advanced into the front door arterial branch and embolization was performed with a 3 mm Ruby coil. Post embolization DSA showed no significant flow to the pseudoaneurysm from the front door branch. Microcatheter  was then utilized to select the back door feeder of the pseudoaneurysm. There was significant arterial spasm, therefore 100 mcg of nitro was administered slowly into the branch resulting in significant improvement of spasm. The microcatheter was advanced to the level of the pseudoaneurysm and embolization was performed with 3 and 4 mm Ruby coils. Post embolization angiogram showed no significant flow to the pseudoaneurysm. Sheath angiogram showed appropriate puncture of the common femoral artery at the level of the femoral head prosthesis. The groin was re-prepped and draped and arteriotomy was closed with 6 [place] Angio-Seal device. 5 minutes of manual compression applied at the access site for additional hemostasis. IMPRESSION: 1. 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 The pseudoaneurysm was successfully coil embolized. 2. No active extravasation or other vascular abnormalities identified on superior or inferior mesenteric angiogram. Electronically Signed   By: Acquanetta Belling M.D.   On: 07/24/2021 11:32   IR Angiogram Selective Each Additional Vessel  Result Date: 07/24/2021 INDICATION: 75 year old woman with brisk lower GI bleed presents to IR for mesenteric angiogram and embolization. EXAM: 1. Ultrasound-guided access of right common femoral artery 2. Superior mesenteric angiogram 3. Inferior mesenteric angiogram 4. Left colic angiogram 5. Middle colic angiogram and pseudoaneurysm embolization MEDICATIONS: None ANESTHESIA/SEDATION: Moderate (conscious) sedation was employed during this procedure. A total of Versed 2 mg and Fentanyl 100 mcg was administered intravenously by the radiology nurse. Total intra-service moderate Sedation Time: 143 minutes. The patient's level of consciousness and vital signs were monitored continuously by radiology nursing throughout the procedure under my direct supervision.  CONTRAST:  141 mL Omnipaque 300 FLUOROSCOPY TIME:  Fluoroscopy Time: 40 minutes 42 seconds (3726 mGy). COMPLICATIONS: None immediate. PROCEDURE: Informed consent was obtained from the patient following explanation of the procedure, risks, benefits and alternatives. Specifically, I discussed with the patient that the risks included, but are not limited to bleeding, infection, arterial injury, and bowel infarction requiring surgical resection. The patient understands, agrees and consents for the procedure. All questions were addressed. A time out was performed prior to the initiation of the procedure. Maximal barrier sterile technique utilized including caps, mask, sterile gowns, sterile gloves, large sterile drape, hand hygiene, and chlorhexidine prep. Right groin skin prepped and draped in usual fashion. Ultrasound image documenting patency of the right common femoral artery was obtained and placed in permanent medical record. Sterile ultrasound probe cover and gel utilized throughout the procedure. Utilizing continuous ultrasound guidance, the right common femoral artery was accessed with a 21 gauge needle. 21 gauge needle exchanged for transitional dilator set over 0.018 inch guidewire. Transitional dilator set exchanged for 5 French sheath over 0.035 inch guidewire. Mickelson catheter utilized to select the superior mesenteric artery. Superior mesenteric arteriogram demonstrated replaced right hepatic artery, patent jejunal, right colic, and middle colic branches. Small  pseudoaneurysm was seen originating from a branch of the middle colic artery. No active extravasation was seen. Mickelson catheter was then utilized to select the inferior mesenteric artery. Angiogram demonstrated patent left colic artery with some flow to the middle colic arcade through the marginal artery of [place]. STC microcatheter was advanced into the left colic and superior rectal arteries and angiograms were performed. No active  extravasation was seen. Mickelson catheter was again utilized to select the superior mesenteric artery. SCC microcatheter advanced into the middle colic artery and angiogram was performed. 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 The microcatheter was advanced into the front door arterial branch and embolization was performed with a 3 mm Ruby coil. Post embolization DSA showed no significant flow to the pseudoaneurysm from the front door branch. Microcatheter was then utilized to select the back door feeder of the pseudoaneurysm. There was significant arterial spasm, therefore 100 mcg of nitro was administered slowly into the branch resulting in significant improvement of spasm. The microcatheter was advanced to the level of the pseudoaneurysm and embolization was performed with 3 and 4 mm Ruby coils. Post embolization angiogram showed no significant flow to the pseudoaneurysm. Sheath angiogram showed appropriate puncture of the common femoral artery at the level of the femoral head prosthesis. The groin was re-prepped and draped and arteriotomy was closed with 6 [place] Angio-Seal device. 5 minutes of manual compression applied at the access site for additional hemostasis. IMPRESSION: 1. 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 The pseudoaneurysm was successfully coil embolized. 2. No active extravasation or other vascular abnormalities identified on superior or inferior mesenteric angiogram. Electronically Signed   By: Acquanetta Belling M.D.   On: 07/24/2021 11:32   IR US Guide Vasc Access Right  Result Date: 07/24/2021 INDICATION: 75 year old woman with brisk lower GI bleed presents to IR for mesenteric angiogram and embolization. EXAM: 1. Ultrasound-guided access of right common femoral artery 2. Superior mesenteric angiogram 3. Inferior mesenteric angiogram 4. Left colic  angiogram 5. Middle colic angiogram and pseudoaneurysm embolization MEDICATIONS: None ANESTHESIA/SEDATION: Moderate (conscious) sedation was employed during this procedure. A total of Versed 2 mg and Fentanyl 100 mcg was administered intravenously by the radiology nurse. Total intra-service moderate Sedation Time: 143 minutes. The patient's level of consciousness and vital signs were monitored continuously by radiology nursing throughout the procedure under my direct supervision. CONTRAST:  141 mL Omnipaque 300 FLUOROSCOPY TIME:  Fluoroscopy Time: 40 minutes 42 seconds (3726 mGy). COMPLICATIONS: None immediate. PROCEDURE: Informed consent was obtained from the patient following explanation of the procedure, risks, benefits and alternatives. Specifically, I discussed with the patient that the risks included, but are not limited to bleeding, infection, arterial injury, and bowel infarction requiring surgical resection. The patient understands, agrees and consents for the procedure. All questions were addressed. A time out was performed prior to the initiation of the procedure. Maximal barrier sterile technique utilized including caps, mask, sterile gowns, sterile gloves, large sterile drape, hand hygiene, and chlorhexidine prep. Right groin skin prepped and draped in usual fashion. Ultrasound image documenting patency of the right common femoral artery was obtained and placed in permanent medical record. Sterile ultrasound probe cover and gel utilized throughout the procedure. Utilizing continuous ultrasound guidance, the right common femoral artery was accessed with a 21 gauge needle. 21 gauge needle exchanged for transitional dilator set over 0.018 inch guidewire. Transitional dilator set exchanged for 5 French sheath over  0.035 inch guidewire. Mickelson catheter utilized to select the superior mesenteric artery. Superior mesenteric arteriogram demonstrated replaced right hepatic artery, patent jejunal, right colic,  and middle colic branches. 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 No active extravasation was seen. Mickelson catheter was then utilized to select the inferior mesenteric artery. Angiogram demonstrated patent left colic artery with some flow to the middle colic arcade through the marginal artery of [place]. STC microcatheter was advanced into the left colic and superior rectal arteries and angiograms were performed. No active extravasation was seen. Mickelson catheter was again utilized to select the superior mesenteric artery. SCC microcatheter advanced into the middle colic artery and angiogram was performed. 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 The microcatheter was advanced into the front door arterial branch and embolization was performed with a 3 mm Ruby coil. Post embolization DSA showed no significant flow to the pseudoaneurysm from the front door branch. Microcatheter was then utilized to select the back door feeder of the pseudoaneurysm. There was significant arterial spasm, therefore 100 mcg of nitro was administered slowly into the branch resulting in significant improvement of spasm. The microcatheter was advanced to the level of the pseudoaneurysm and embolization was performed with 3 and 4 mm Ruby coils. Post embolization angiogram showed no significant flow to the pseudoaneurysm. Sheath angiogram showed appropriate puncture of the common femoral artery at the level of the femoral head prosthesis. The groin was re-prepped and draped and arteriotomy was closed with 6 [place] Angio-Seal device. 5 minutes of manual compression applied at the access site for additional hemostasis. IMPRESSION: 1. 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 The pseudoaneurysm was successfully coil embolized. 2. No active extravasation or other  vascular abnormalities identified on superior or inferior mesenteric angiogram. Electronically Signed   By: Acquanetta Belling M.D.   On: 07/24/2021 11:32   IR EMBO ART  VEN HEMORR LYMPH EXTRAV  INC GUIDE ROADMAPPING  Result Date: 07/24/2021 INDICATION: 75 year old woman with brisk lower GI bleed presents to IR for mesenteric angiogram and embolization. EXAM: 1. Ultrasound-guided access of right common femoral artery 2. Superior mesenteric angiogram 3. Inferior mesenteric angiogram 4. Left colic angiogram 5. Middle colic angiogram and pseudoaneurysm embolization MEDICATIONS: None ANESTHESIA/SEDATION: Moderate (conscious) sedation was employed during this procedure. A total of Versed 2 mg and Fentanyl 100 mcg was administered intravenously by the radiology nurse. Total intra-service moderate Sedation Time: 143 minutes. The patient's level of consciousness and vital signs were monitored continuously by radiology nursing throughout the procedure under my direct supervision. CONTRAST:  141 mL Omnipaque 300 FLUOROSCOPY TIME:  Fluoroscopy Time: 40 minutes 42 seconds (3726 mGy). COMPLICATIONS: None immediate. PROCEDURE: Informed consent was obtained from the patient following explanation of the procedure, risks, benefits and alternatives. Specifically, I discussed with the patient that the risks included, but are not limited to bleeding, infection, arterial injury, and bowel infarction requiring surgical resection. The patient understands, agrees and consents for the procedure. All questions were addressed. A time out was performed prior to the initiation of the procedure. Maximal barrier sterile technique utilized including caps, mask, sterile gowns, sterile gloves, large sterile drape, hand hygiene, and chlorhexidine prep. Right groin skin prepped and draped in usual fashion. Ultrasound image documenting patency of the right common femoral artery was obtained and placed in permanent medical record. Sterile ultrasound  probe cover and gel utilized throughout the procedure. Utilizing  continuous ultrasound guidance, the right common femoral artery was accessed with a 21 gauge needle. 21 gauge needle exchanged for transitional dilator set over 0.018 inch guidewire. Transitional dilator set exchanged for 5 French sheath over 0.035 inch guidewire. Mickelson catheter utilized to select the superior mesenteric artery. Superior mesenteric arteriogram demonstrated replaced right hepatic artery, patent jejunal, right colic, and middle colic branches. 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 No active extravasation was seen. Mickelson catheter was then utilized to select the inferior mesenteric artery. Angiogram demonstrated patent left colic artery with some flow to the middle colic arcade through the marginal artery of [place]. STC microcatheter was advanced into the left colic and superior rectal arteries and angiograms were performed. No active extravasation was seen. Mickelson catheter was again utilized to select the superior mesenteric artery. SCC microcatheter advanced into the middle colic artery and angiogram was performed. 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 The microcatheter was advanced into the front door arterial branch and embolization was performed with a 3 mm Ruby coil. Post embolization DSA showed no significant flow to the pseudoaneurysm from the front door branch. Microcatheter was then utilized to select the back door feeder of the pseudoaneurysm. There was significant arterial spasm, therefore 100 mcg of nitro was administered slowly into the branch resulting in significant improvement of spasm. The microcatheter was advanced to the level of the pseudoaneurysm and embolization was performed with 3 and 4 mm Ruby coils. Post embolization angiogram showed no significant flow to the pseudoaneurysm. Sheath angiogram showed appropriate  puncture of the common femoral artery at the level of the femoral head prosthesis. The groin was re-prepped and draped and arteriotomy was closed with 6 [place] Angio-Seal device. 5 minutes of manual compression applied at the access site for additional hemostasis. IMPRESSION: 1. 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 The pseudoaneurysm was successfully coil embolized. 2. No active extravasation or other vascular abnormalities identified on superior or inferior mesenteric angiogram. Electronically Signed   By: Acquanetta Belling M.D.   On: 07/24/2021 11:32      Darnell Level 07/25/2021   Patient ID: Nena Jordan, female   DOB: 10-Feb-1946, 75 y.o.   MRN: [id number]

## 2021-07-25 NOTE — Plan of Care (Signed)
  Problem: Education: Goal: Knowledge of General Education information will improve Description Including pain rating scale, medication(s)/side effects and non-pharmacologic comfort measures Outcome: Progressing   

## 2021-07-25 NOTE — Progress Notes (Signed)
   07/25/21 1154  Assess: MEWS Score  Temp (!) 102.1 F (38.9 C)  BP (!) 134/52  Pulse Rate 98  Resp (!) 22  SpO2 93 %  Assess: MEWS Score  MEWS Temp 2  MEWS Systolic 0  MEWS Pulse 0  MEWS RR 1  MEWS LOC 0  MEWS Score 3  MEWS Score Color Yellow  Assess: if the MEWS score is Yellow or Red  Were vital signs taken at a resting state? Yes  Focused Assessment No change from prior assessment  Does the patient meet 2 or more of the SIRS criteria? No  MEWS guidelines implemented *See Row Information* Yes  Take Vital Signs  Increase Vital Sign Frequency  Yellow: Q 2hr X 2 then Q 4hr X 2, if remains yellow, continue Q 4hrs  Escalate  MEWS: Escalate Yellow: discuss with charge nurse/RN and consider discussing with provider and RRT  Notify: Charge Nurse/RN  Name of Charge Nurse/RN Notified Megan RN  Date Charge Nurse/RN Notified 07/25/21  Time Charge Nurse/RN Notified 1157  Notify: Provider  Provider Name/Title Wouk  Date Provider Notified 07/25/21  Time Provider Notified 1201  Notification Type Page  Notification Reason Change in status  Provider response No new orders (Temp rechecked 100.9 oral, will see patient soon)  Assess: SIRS CRITERIA  SIRS Temperature  1  SIRS Pulse 1  SIRS Respirations  1  SIRS WBC 0  SIRS Score Sum  3

## 2021-07-26 ENCOUNTER — Encounter (HOSPITAL_COMMUNITY): Payer: Self-pay | Admitting: Gastroenterology

## 2021-07-26 DIAGNOSIS — N390 Urinary tract infection, site not specified: Secondary | ICD-10-CM

## 2021-07-26 LAB — MAGNESIUM: Magnesium: 1.9 mg/dL (ref 1.7–2.4)

## 2021-07-26 LAB — GLUCOSE, CAPILLARY
Glucose-Capillary: 119 mg/dL — ABNORMAL HIGH (ref 70–99)
Glucose-Capillary: 121 mg/dL — ABNORMAL HIGH (ref 70–99)
Glucose-Capillary: 141 mg/dL — ABNORMAL HIGH (ref 70–99)
Glucose-Capillary: 143 mg/dL — ABNORMAL HIGH (ref 70–99)
Glucose-Capillary: 158 mg/dL — ABNORMAL HIGH (ref 70–99)
Glucose-Capillary: 182 mg/dL — ABNORMAL HIGH (ref 70–99)

## 2021-07-26 LAB — CBC
HCT: 21.2 % — ABNORMAL LOW (ref 36.0–46.0)
Hemoglobin: 6.8 g/dL — CL (ref 12.0–15.0)
MCH: 30.8 pg (ref 26.0–34.0)
MCHC: 32.1 g/dL (ref 30.0–36.0)
MCV: 95.9 fL (ref 80.0–100.0)
Platelets: 246 10*3/uL (ref 150–400)
RBC: 2.21 MIL/uL — ABNORMAL LOW (ref 3.87–5.11)
RDW: 15.4 % (ref 11.5–15.5)
WBC: 13.5 10*3/uL — ABNORMAL HIGH (ref 4.0–10.5)
nRBC: 0 % (ref 0.0–0.2)

## 2021-07-26 LAB — BASIC METABOLIC PANEL
Anion gap: 8 (ref 5–15)
BUN: 11 mg/dL (ref 8–23)
CO2: 22 mmol/L (ref 22–32)
Calcium: 8 mg/dL — ABNORMAL LOW (ref 8.9–10.3)
Chloride: 107 mmol/L (ref 98–111)
Creatinine, Ser: 0.63 mg/dL (ref 0.44–1.00)
GFR, Estimated: >60 mL/min (ref >60)
Glucose, Bld: 131 mg/dL — ABNORMAL HIGH (ref 70–99)
Potassium: 3.4 mmol/L — ABNORMAL LOW (ref 3.5–5.1)
Sodium: 137 mmol/L (ref 135–145)

## 2021-07-26 LAB — PREPARE RBC (CROSSMATCH)

## 2021-07-26 LAB — HEMOGLOBIN AND HEMATOCRIT, BLOOD
HCT: 31.9 % — ABNORMAL LOW (ref 36.0–46.0)
Hemoglobin: 10.8 g/dL — ABNORMAL LOW (ref 12.0–15.0)

## 2021-07-26 MED ORDER — FUROSEMIDE 10 MG/ML IJ SOLN: 20.0000 mg | Freq: Once | INTRAMUSCULAR | Status: DC

## 2021-07-26 MED ORDER — ACETAMINOPHEN 325 MG PO TABS
650.0000 mg | ORAL_TABLET | Freq: Once | ORAL | Status: AC
  Administered 2021-07-26: 09:00:00 650 mg via ORAL
  Filled 2021-07-26: qty 2

## 2021-07-26 MED ORDER — POTASSIUM CHLORIDE CRYS ER 10 MEQ PO TBCR
40.0000 meq | EXTENDED_RELEASE_TABLET | Freq: Once | ORAL | Status: AC
  Administered 2021-07-26: 09:00:00 40 meq via ORAL
  Filled 2021-07-26: qty 4

## 2021-07-26 MED ORDER — SODIUM CHLORIDE 0.9% IV SOLUTION: Freq: Once | INTRAVENOUS | Status: AC

## 2021-07-26 MED ORDER — FUROSEMIDE 10 MG/ML IJ SOLN
20.0000 mg | Freq: Once | INTRAMUSCULAR | Status: AC
  Administered 2021-07-26: 14:00:00 20 mg via INTRAVENOUS
  Filled 2021-07-26: qty 2

## 2021-07-26 MED ORDER — SODIUM CHLORIDE 0.9 % IV SOLN
2.0000 g | INTRAVENOUS | Status: AC
  Administered 2021-07-26 – 2021-07-27 (×2): 2 g via INTRAVENOUS
  Filled 2021-07-26 (×2): qty 20

## 2021-07-26 MED ORDER — DIPHENHYDRAMINE HCL 25 MG PO CAPS
25.0000 mg | ORAL_CAPSULE | Freq: Once | ORAL | Status: AC
  Administered 2021-07-26: 09:00:00 25 mg via ORAL
  Filled 2021-07-26: qty 1

## 2021-07-26 NOTE — Progress Notes (Signed)
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 Darnell Level, 1994 Rule out colonic ischemia following embolization procedure             WBC (13.5), potassium (3.4), bicarb (22) this AM             Mild right mid abdominal pain             Exam benign   General surgery continues to follow patient and has not signed off case.  Tolerating limited clear liquid diet, but would not advance at this point.  Doubt significant ischemia from embolization, but has not had further imaging studies.  Appears to have persistent, low-grade bleeding likely from proximal transverse colon.  Options are continued observation, consider repeat colonoscopy, consider repeat bleeding scan, consider repeat IR angiography with further embolization.  Surgical resection would be last resort but may become necessary if all other options exhausted.  I will discuss with Dr. Angelena Form from colorectal surgery who will be our hospitalist this week at [facility].  All of this discussed with patient and her husband at bedside.        Darnell Level, MD       [facility]       Office: [phone]   Chief Complaint: Lower GI bleeding  Subjective: Patient in bed, mild right sided abdominal "soreness", taking limited clear liquid diet  Objective: Vital signs in last 24 hours: Temp:  [98.3 F (36.8 C)-100 F (37.8 C)] 99.5 F (37.5 C) (11/27 1001) Pulse Rate:  [80-94] 88 (11/27 1001) Resp:  [15-22] 19 (11/27 1001) BP: (108-132)/(47-50) 108/47 (11/27 1001) SpO2:  [94 %-97 %] 97 % (11/27 0932) Last BM Date: 07/26/21  Intake/Output from previous day: 11/26 0701 -  11/27 0700 In: 1543.5 [P.O.:420; I.V.:1123.5] Out: 350 [Urine:150; Stool:200] Intake/Output this shift: Total I/O In: 240 [P.O.:240] Out: -   Physical Exam: HEENT - sclerae clear, mucous membranes moist Neck - soft Abdomen - soft, mild tenderness right mid abdomen, no mass; BS normal Ext - no edema, non-tender Neuro - alert & oriented, no focal deficits  Lab Results:  Recent Labs    07/25/21 0510 07/25/21 1444 07/25/21 2143 07/26/21 0458  WBC 14.4*  --   --  13.5*  HGB 7.3*   < > 7.0* 6.8*  HCT 22.3*  --   --  21.2*  PLT 254  --   --  246   < > = values in this interval not displayed.   BMET Recent Labs    07/25/21 0510 07/26/21 0458  NA 136 137  K 3.8 3.4*  CL 106 107  CO2 21* 22  GLUCOSE 132* 131*  BUN 9 11  CREATININE 0.75 0.63  CALCIUM 7.8* 8.0*   PT/INR Recent Labs    07/24/21 0032  LABPROT 14.2  INR 1.1   Comprehensive Metabolic Panel:    Component Value Date/Time   NA 137  07/26/2021 0458   NA 136 07/25/2021 0510   K 3.4 (L) 07/26/2021 0458   K 3.8 07/25/2021 0510   CL 107 07/26/2021 0458   CL 106 07/25/2021 0510   CO2 22 07/26/2021 0458   CO2 21 (L) 07/25/2021 0510   BUN 11 07/26/2021 0458   BUN 9 07/25/2021 0510   CREATININE 0.63 07/26/2021 0458   CREATININE 0.75 07/25/2021 0510   GLUCOSE 131 (H) 07/26/2021 0458   GLUCOSE 132 (H) 07/25/2021 0510   GLUCOSE 120 (H) 08/31/2006 0832   CALCIUM 8.0 (L) 07/26/2021 0458   CALCIUM 7.8 (L) 07/25/2021 0510   AST 23 07/18/2021 1126   AST 40 11/10/2015 0011   ALT 24 07/18/2021 1126   ALT 44 11/10/2015 0011   ALKPHOS 81 07/18/2021 1126   ALKPHOS 54 11/10/2015 0011   BILITOT 0.5 07/18/2021 1126   BILITOT 0.7 11/10/2015 0011   PROT 8.0 07/18/2021 1126   PROT 7.2 11/10/2015 0011   ALBUMIN 4.2 07/18/2021 1126   ALBUMIN 4.2 11/10/2015 0011    Studies/Results: DG CHEST PORT 1 VIEW  Result Date: 07/25/2021 CLINICAL DATA:  Fever.  Some rectal bleeding. EXAM: PORTABLE CHEST 1 VIEW COMPARISON:   04/06/2012. FINDINGS: Cardiac silhouette is normal in size. No mediastinal or hilar masses. Clear lungs.  No convincing pleural effusion and no pneumothorax. Skeletal structures are grossly intact. IMPRESSION: No active disease. Electronically Signed   By: Amie Portland M.D.   On: 07/25/2021 14:42      Darnell Level 07/26/2021   Patient ID: Melissa [name], female   DOB: 23-Dec-1945, 76 y.o.   MRN: [id number]

## 2021-07-26 NOTE — Progress Notes (Addendum)
PROGRESS NOTE    Melissa [name]  HTD:[id number] DOB: 06-Oct-1945 DOA: 07/18/2021 PCP: Marden Noble, MD    Chief Complaint  Patient presents with   Rectal Bleeding    Brief Narrative:  75 y.o. female with history of prior GI bleed in 2017 and subsequent colonoscopy in 2018 was unremarkable with history of diabetes mellitus hypertension and hyperlipidemia presents to the ER with complaints of having multiple episodes of bleeding per rectum bright blood per rectum.  Denies any abdominal pain vomiting. In the ED, CTA without active bleed seen. 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 -Initial CT angiogram abdomen and pelvis was negative for acute bleed but follow-up nuclear medicine bleeding scan noted possible bleed at the splenic flexure 07/19/2021. -IR consulted but initially patient declined embolization at that time. (Per Dr Ashok Pall husband stated embolization was advised by NP however they did not trust NP's) -Hemoglobin continues to trend down, patient noted to have continued bleeding and subsequently agreed to embolization. -Prior provider had discussed with IR who recommended repeat CT angio for bleed, if negative then will need colonoscopy. -Repeat CT angio was negative so colonoscopy performed 07/22/2021 with copious blood in the colon with no source identified. -Patient transfused 2 units packed red blood cells 07/21/2021 with hemoglobin dropping to the 60s with continued blood movements on 1123 after colonoscopy however hemoglobin stabilized at 8. -Patient noted to have continued bleeding throughout the day 07/23/2021  hemoglobin was down to 6.7 and patient transfused 3 more units of packed red blood cells with a total of 5 units of packed red blood cells transfused. -Tagged red blood cell scan done 07/23/2021 showed bleeding at the splenic flexure, IR consulted perform angiography with embolization to middle colonic artery on 07/23/2021, also advised transfusion 1 unit platelets which was done. -Since embolization bleeding seem to be slowing down however patient stated had small dark red stool this morning. -Hemoglobin at 6.8 this morning from 7.0 yesterday. -Transfused 2 units packed red blood cells. -Patient noted after procedure to have developed 8/10 periumbilical pain, case discussed with GI and IR who recommended general surgery consultation.  Abdominal pain improved, general surgery following and do not think significant ischemia at this time and not currently following and recommending continuation of clear liquid diet with concern for low-grade bleeding likely from proximal transverse colon and recommendations included continued observation, consideration for repeat colonoscopy, consideration for repeat bleeding scan, consideration for repeat IR angiography with further embolization with surgical resection as the last resort if all options exhausted.. -GI following and feel patient is not having fulminant rebleeding and recommending medical management and supportive care at this time for the next 1 to 2 days, and if patient develops more compelling evidence of active rebleeding may consider repeat colonoscopy. -GI, general surgery following and appreciate input and recommendations.  2.  Fever likely secondary to UTI -Patient noted to have a temp of 102.1 on 07/25/2021. -Urinalysis done concerning for UTI. -Urine cultures pending. -IV Rocephin.  3.  Hypertension -Blood pressure soft. -Continue to hold oral antihypertensive medications. -Hydralazine as needed.  4.  Diabetes mellitus type 2 -Hemoglobin  A1c noted at 7.2 (11/10/2015) -  CBG 141 this morning. -Check a hemoglobin A1c. -Continue to hold oral hypoglycemic agents. -SSI.  5.  Hyperlipidemia -Statin.  6.  Hypokalemia  -Replete.    DVT prophylaxis: SCDs Code Status: Full Family Communication: Updated patient, husband at bedside. Disposition:   Status is: Inpatient  Remains inpatient appropriate because: Severity of illness.       Consultants:  Gastroenterology: Dr. Marca Ancona 07/19/2021 Interventional radiology: Dr. Loreta Ave 07/19/2021 General surgery: Dr. Gerrit Friends 07/24/2021  Procedures:  Chest x-ray 07/25/2021 Blood cell scan/bleeding scan 07/19/2021, 07/23/2021 Ultrasound-guided access of right common femoral artery/superior mesenteric angiogram/inferior mesenteric angiogram/left colic angiogram/middle colic angiogram and pseudoaneurysm embolization per IR, Dr. Bryn Gulling 07/23/2021 CT angiogram abdomen and pelvis 07/21/2021 Transfuse 5 units packed red blood cells 07/23/2021-07/24/2021 Transfused 2 units packed red blood cells pending 07/26/2021  Antimicrobials:  IV Rocephin 07/25/2021>>>>>    Subjective: Laying in bed.  Overall feeling better.  Per husband patient was able to get up to use the restroom clean herself.  Patient denies any significant shortness of breath.  No chest pain.  No abdominal pain.  Patient stated had a very small amount of darkish stool this morning none since.  Currently receiving transfusion of packed RBCs.  Objective: Vitals:   07/25/21 2204 07/26/21 0425 07/26/21 0932 07/26/21 1001  BP: (!) 132/50 (!) 120/48 (!) 120/50 (!) 108/47  Pulse: 93 94 90 88  Resp: Temp: 100 F (37.8 C) 99.6 F (37.6 C) 98.7 F (37.1 C) 99.5 F (37.5 C)  TempSrc: Oral Oral Oral Oral  SpO2: 94% 94% 97%   Weight:      Height:        Intake/Output Summary (Last 24 hours) at 07/26/2021 1201 Last data filed at 07/26/2021 0740 Gross per 24 hour  Intake 1723.47 ml  Output 350 ml  Net 1373.47 ml    Filed Weights   07/18/21 1051  Weight: 87.1 kg    Examination:  General exam: NAD. Respiratory system: Minimal expiratory wheezing.  Fair air movement.  Normal respiratory effort.  Speaking in full sentences. Cardiovascular system: S1 & S2 heard, RRR. No JVD, murmurs, rubs, gallops or clicks. No pedal edema. Gastrointestinal system: Abdomen is nondistended, soft and nontender. No organomegaly or masses felt. Normal bowel sounds heard. Central nervous system: Alert and oriented. No focal neurological deficits. Extremities: Symmetric 5 x 5 power. Skin: No rashes, lesions or ulcers Psychiatry: Judgement and insight appear normal. Mood & affect appropriate.     Data Reviewed: I have personally reviewed following labs and imaging studies  CBC: Recent Labs  Lab 07/22/21 1358 07/22/21 1913 07/23/21 0511 07/23/21 1140 07/24/21 0032 07/24/21 0618 07/24/21 2214 07/25/21 0510 07/25/21 1444 07/25/21 2143 07/26/21 0458  WBC 6.9  --  5.1  --  6.4  --   --  14.4*  --   --  13.5*  HGB 8.0*   < > 6.7*   < > 7.7*  7.8*   < > 7.5* 7.3* 7.2* 7.0* 6.8*  HCT 24.4*  --  20.4*  --  24.4*  --   --  22.3*  --   --  21.2*  MCV 95.3  --  97.6  --  97.6  --   --  96.5  --   --  95.9  PLT 227  --  211  --  194  --   --  254  --   --  246   < > = values in this interval not displayed.  Basic Metabolic Panel: Recent Labs  Lab 07/23/21 0511 07/24/21 0032 07/24/21 1243 07/25/21 0510 07/26/21 0458  NA 140 138  --  136 137  K 3.7 3.4*  --  3.8 3.4*  CL 110 108  --  106 107  CO2 24 22  --  21* 22  GLUCOSE 152* 150*  --  132* 131*  BUN 9 8  --  9 11  CREATININE 0.61 0.66  --  0.75 0.63  CALCIUM 8.3* 8.2*  --  7.8* 8.0*  MG  --   --  1.6*  --  1.9    GFR: Estimated Creatinine Clearance: 61.6 mL/min (by C-G formula based on SCr of 0.63 mg/dL).  Liver Function Tests: No results for input(s): AST, ALT, ALKPHOS, BILITOT, PROT, ALBUMIN in the last 168 hours.  CBG: Recent Labs  Lab  07/25/21 2035 07/26/21 0020 07/26/21 0419 07/26/21 0757 07/26/21 1129  GLUCAP 187* 119* 121* 141* 143*     Recent Results (from the past 240 hour(s))  Resp Panel by RT-PCR (Flu A&B, Covid) Nasopharyngeal Swab     Status: None   Collection Time: 07/18/21  6:30 PM   Specimen: Nasopharyngeal Swab; Nasopharyngeal(NP) swabs in vial transport medium  Result Value Ref Range Status   SARS Coronavirus 2 by RT PCR NEGATIVE NEGATIVE Final    Comment: (NOTE) SARS-CoV-2 target nucleic acids are NOT DETECTED.  The SARS-CoV-2 RNA is generally detectable in upper respiratory specimens during the acute phase of infection. The lowest concentration of SARS-CoV-2 viral copies this assay can detect is 138 copies/mL. A negative result does not preclude SARS-Cov-2 infection and should not be used as the sole basis for treatment or other patient management decisions. A negative result may occur with  improper specimen collection/handling, submission of specimen other than nasopharyngeal swab, presence of viral mutation(s) within the areas targeted by this assay, and inadequate number of viral copies(<138 copies/mL). A negative result must be combined with clinical observations, patient history, and epidemiological information. The expected result is Negative.  Fact Sheet for Patients:  BloggerCourse.com  Fact Sheet for Healthcare Providers:  SeriousBroker.it  This test is no t yet approved or cleared by the [place] FDA and  has been authorized for detection and/or diagnosis of SARS-CoV-2 by FDA under an Emergency Use Authorization (EUA). This EUA will remain  in effect (meaning this test can be used) for the duration of the COVID-19 declaration under Section 564(b)(1) of the Act, 21 U.S.C.section 360bbb-3(b)(1), unless the authorization is terminated  or revoked sooner.       Influenza A by PCR NEGATIVE NEGATIVE Final   Influenza B by  PCR NEGATIVE NEGATIVE Final    Comment: (NOTE) The Xpert Xpress SARS-CoV-2/FLU/RSV plus assay is intended as an aid in the diagnosis of influenza from Nasopharyngeal swab specimens and should not be used as a sole basis for treatment. Nasal washings and aspirates are unacceptable for Xpert Xpress SARS-CoV-2/FLU/RSV testing.  Fact Sheet for Patients: BloggerCourse.com  Fact Sheet for Healthcare Providers: SeriousBroker.it  This test is not yet approved or cleared by the [place] FDA and has been authorized for detection and/or diagnosis of SARS-CoV-2 by FDA under an Emergency Use Authorization (EUA). This EUA will remain in effect (meaning this test can be used) for the duration of the COVID-19 declaration under Section 564(b)(1) of the Act, 21 U.S.C. section 360bbb-3(b)(1), unless the authorization is terminated or revoked.  Performed at [facility], [address], [place], [place] [address]  Resp Panel by RT-PCR (Flu A&B, Covid) Nasopharyngeal Swab     Status: None   Collection Time: 07/25/21  2:00 PM   Specimen: Nasopharyngeal Swab; Nasopharyngeal(NP) swabs in vial transport medium  Result Value Ref Range Status   SARS Coronavirus 2 by RT PCR NEGATIVE NEGATIVE Final    Comment: (NOTE) SARS-CoV-2 target nucleic acids are NOT DETECTED.  The SARS-CoV-2 RNA is generally detectable in upper respiratory specimens during the acute phase of infection. The lowest concentration of SARS-CoV-2 viral copies this assay can detect is 138 copies/mL. A negative result does not preclude SARS-Cov-2 infection and should not be used as the sole basis for treatment or other patient management decisions. A negative result may occur with  improper specimen collection/handling, submission of specimen other than nasopharyngeal swab, presence of viral mutation(s) within the areas targeted by this assay, and inadequate number  of viral copies(<138 copies/mL). A negative result must be combined with clinical observations, patient history, and epidemiological information. The expected result is Negative.  Fact Sheet for Patients:  BloggerCourse.com  Fact Sheet for Healthcare Providers:  SeriousBroker.it  This test is no t yet approved or cleared by the [place] FDA and  has been authorized for detection and/or diagnosis of SARS-CoV-2 by FDA under an Emergency Use Authorization (EUA). This EUA will remain  in effect (meaning this test can be used) for the duration of the COVID-19 declaration under Section 564(b)(1) of the Act, 21 U.S.C.section 360bbb-3(b)(1), unless the authorization is terminated  or revoked sooner.       Influenza A by PCR NEGATIVE NEGATIVE Final   Influenza B by PCR NEGATIVE NEGATIVE Final    Comment: (NOTE) The Xpert Xpress SARS-CoV-2/FLU/RSV plus assay is intended as an aid in the diagnosis of influenza from Nasopharyngeal swab specimens and should not be used as a sole basis for treatment. Nasal washings and aspirates are unacceptable for Xpert Xpress SARS-CoV-2/FLU/RSV testing.  Fact Sheet for Patients: BloggerCourse.com  Fact Sheet for Healthcare Providers: SeriousBroker.it  This test is not yet approved or cleared by the [place] FDA and has been authorized for detection and/or diagnosis of SARS-CoV-2 by FDA under an Emergency Use Authorization (EUA). This EUA will remain in effect (meaning this test can be used) for the duration of the COVID-19 declaration under Section 564(b)(1) of the Act, 21 U.S.C. section 360bbb-3(b)(1), unless the authorization is terminated or revoked.  Performed at [facility], [address] [place], [place] [address]   Culture, blood (routine x 2)     Status: None (Preliminary result)   Collection Time: 07/25/21   2:44 PM   Specimen: BLOOD  Result Value Ref Range Status   Specimen Description   Final    BLOOD LEFT ANTECUBITAL Performed at [facility], [address] [place], [place] [address]    Special Requests   Final    BOTTLES DRAWN AEROBIC AND ANAEROBIC Blood Culture adequate volume Performed at [facility], [address], [place], [place] [address]    Culture   Final    NO GROWTH < 24 HOURS Performed at [facility], [address], [place], [place] [address]    Report Status PENDING  Incomplete  Culture, blood (routine x 2)     Status: None (Preliminary result)   Collection Time: 07/25/21  2:44 PM   Specimen: BLOOD  Result Value Ref Range Status   Specimen Description   Final    BLOOD BLOOD RIGHT HAND Performed  at [facility], [address] [place], [place] [address]    Special Requests   Final    BOTTLES DRAWN AEROBIC AND ANAEROBIC Blood Culture adequate volume Performed at [facility], [address] [place], [place] [address]    Culture   Final    NO GROWTH < 24 HOURS Performed at [facility], [address], [place], [place] [address]    Report Status PENDING  Incomplete         Radiology Studies: DG CHEST PORT 1 VIEW  Result Date: 07/25/2021 CLINICAL DATA:  Fever.  Some rectal bleeding. EXAM: PORTABLE CHEST 1 VIEW COMPARISON:  04/06/2012. FINDINGS: Cardiac silhouette is normal in size. No mediastinal or hilar masses. Clear lungs.  No convincing pleural effusion and no pneumothorax. Skeletal structures are grossly intact. IMPRESSION: No active disease. Electronically Signed   By: Amie Portland M.D.   On: 07/25/2021 14:42        Scheduled Meds:  sodium chloride   Intravenous Once   insulin aspart  0-9 Units Subcutaneous Q4H   pantoprazole  40 mg Oral Daily   rosuvastatin  5 mg Oral QHS   Continuous Infusions:  cefTRIAXone (ROCEPHIN)  IV       LOS: 7 days     Time spent: 40 minutes    Ramiro Harvest, MD [facility]   To contact the attending provider between 7A-7P or the covering provider during after hours 7P-7A, please log into the web site [email or web address] and access using universal  password for that web site. If you do not have the password, please call the hospital operator.  07/26/2021, 12:01 PM   Patient

## 2021-07-26 NOTE — Progress Notes (Signed)
Subjective: Dark and red blood admixed.  Per discussion with patient and husband, the overall amount remains far less than prior to her embolization procedure.  Abdominal pain, right-sided, improving but not resolved.  Fevers, with UTI.  Objective: Vital signs in last 24 hours: Temp:  [98.3 F (36.8 C)-100 F (37.8 C)] 99.5 F (37.5 C) (11/27 1001) Pulse Rate:  [80-94] 88 (11/27 1001) Resp:  [15-22] 19 (11/27 1001) BP: (108-132)/(47-50) 108/47 (11/27 1001) SpO2:  [94 %-97 %] 97 % (11/27 0932) Weight change:  Last BM Date: 07/26/21  PE: GEN:  Pale, NAD, deconditioned ABD:  Mild right mid- and lower-abdominal tenderness without peritonitis  Lab Results: CBC    Component Value Date/Time   WBC 13.5 (H) 07/26/2021 0458   RBC 2.21 (L) 07/26/2021 0458   HGB 6.8 (LL) 07/26/2021 0458   HCT 21.2 (L) 07/26/2021 0458   PLT 246 07/26/2021 0458   MCV 95.9 07/26/2021 0458   MCH 30.8 07/26/2021 0458   MCHC 32.1 07/26/2021 0458   RDW 15.4 07/26/2021 0458   MONOABS 0.6 06/10/2008 0923   EOSABS 0.2 06/10/2008 0923   BASOSABS 0.0 06/10/2008 0923  CMP     Component Value Date/Time   NA 137 07/26/2021 0458   K 3.4 (L) 07/26/2021 0458   CL 107 07/26/2021 0458   CO2 22 07/26/2021 0458   GLUCOSE 131 (H) 07/26/2021 0458   GLUCOSE 120 (H) 08/31/2006 0832   BUN 11 07/26/2021 0458   CREATININE 0.63 07/26/2021 0458   CALCIUM 8.0 (L) 07/26/2021 0458   PROT 8.0 07/18/2021 1126   ALBUMIN 4.2 07/18/2021 1126   AST 23 07/18/2021 1126   ALT 24 07/18/2021 1126   ALKPHOS 81 07/18/2021 1126   BILITOT 0.5 07/18/2021 1126   GFRNONAA >60 07/26/2021 0458   GFRAA >60 11/11/2015 0609   Assessment:   Hematochezia, recurrent?  Overall, based on my discussion with patient/husband, the bleeding is far less than prior to embolization.  Anemia, acute blood loss, multiple transfusions.  Plan:   Case discussed extensively with patient and husband at bedside.  We all feel that it is not clear that  patient is having fulminant rebleeding.  As such, we have all decided to continue medical management and supportive care for the next day or two.  If patient develops more compelling evidence of active rebleeding, we would consider repeat colonoscopy.  [facility] will follow.   Freddy Jaksch 07/26/2021, 12:48 PM   Cell [phone] If no answer or after 5 PM call [phone]

## 2021-07-27 ENCOUNTER — Inpatient Hospital Stay (HOSPITAL_COMMUNITY): Payer: Medicare Other

## 2021-07-27 LAB — BPAM RBC
Blood Product Expiration Date: 202212272359
Blood Product Expiration Date: 202212272359
Blood Product Expiration Date: 202212272359
Blood Product Expiration Date: 202212282359
Blood Product Expiration Date: 202212312359
ISSUE DATE / TIME: 202211240633
ISSUE DATE / TIME: 202211241541
ISSUE DATE / TIME: 202211250150
ISSUE DATE / TIME: 202211270940
ISSUE DATE / TIME: 202211271348
Unit Type and Rh: 5100
Unit Type and Rh: 5100
Unit Type and Rh: 5100
Unit Type and Rh: 5100
Unit Type and Rh: 5100

## 2021-07-27 LAB — TYPE AND SCREEN
ABO/RH(D): O POS
ABO/RH(D): O POS
Antibody Screen: NEGATIVE
Antibody Screen: NEGATIVE
Unit division: 0
Unit division: 0
Unit division: 0
Unit division: 0
Unit division: 0

## 2021-07-27 LAB — BPAM PLATELET PHERESIS
Blood Product Expiration Date: 202211242359
Blood Product Expiration Date: 202211272359
ISSUE DATE / TIME: 202211251104
Unit Type and Rh: 5100
Unit Type and Rh: 6200

## 2021-07-27 LAB — GLUCOSE, CAPILLARY
Glucose-Capillary: 126 mg/dL — ABNORMAL HIGH (ref 70–99)
Glucose-Capillary: 134 mg/dL — ABNORMAL HIGH (ref 70–99)
Glucose-Capillary: 157 mg/dL — ABNORMAL HIGH (ref 70–99)
Glucose-Capillary: 167 mg/dL — ABNORMAL HIGH (ref 70–99)
Glucose-Capillary: 188 mg/dL — ABNORMAL HIGH (ref 70–99)
Glucose-Capillary: 207 mg/dL — ABNORMAL HIGH (ref 70–99)

## 2021-07-27 LAB — BASIC METABOLIC PANEL
Anion gap: 7 (ref 5–15)
BUN: 8 mg/dL (ref 8–23)
CO2: 25 mmol/L (ref 22–32)
Calcium: 8.3 mg/dL — ABNORMAL LOW (ref 8.9–10.3)
Chloride: 106 mmol/L (ref 98–111)
Creatinine, Ser: 0.58 mg/dL (ref 0.44–1.00)
GFR, Estimated: >60 mL/min (ref >60)
Glucose, Bld: 130 mg/dL — ABNORMAL HIGH (ref 70–99)
Potassium: 3.6 mmol/L (ref 3.5–5.1)
Sodium: 138 mmol/L (ref 135–145)

## 2021-07-27 LAB — PREPARE PLATELET PHERESIS
Unit division: 0
Unit division: 0

## 2021-07-27 LAB — CBC
HCT: 29.4 % — ABNORMAL LOW (ref 36.0–46.0)
Hemoglobin: 9.7 g/dL — ABNORMAL LOW (ref 12.0–15.0)
MCH: 30.9 pg (ref 26.0–34.0)
MCHC: 33 g/dL (ref 30.0–36.0)
MCV: 93.6 fL (ref 80.0–100.0)
Platelets: 231 10*3/uL (ref 150–400)
RBC: 3.14 MIL/uL — ABNORMAL LOW (ref 3.87–5.11)
RDW: 14.9 % (ref 11.5–15.5)
WBC: 11.3 10*3/uL — ABNORMAL HIGH (ref 4.0–10.5)
nRBC: 0 % (ref 0.0–0.2)

## 2021-07-27 LAB — URINE CULTURE: Culture: >=100000 — AB

## 2021-07-27 LAB — MAGNESIUM: Magnesium: 1.9 mg/dL (ref 1.7–2.4)

## 2021-07-27 MED ORDER — POTASSIUM CHLORIDE CRYS ER 20 MEQ PO TBCR
40.0000 meq | EXTENDED_RELEASE_TABLET | Freq: Once | ORAL | Status: AC
  Administered 2021-07-27: 15:00:00 40 meq via ORAL
  Filled 2021-07-27: qty 2

## 2021-07-27 MED ORDER — CEFADROXIL 500 MG PO CAPS
500.0000 mg | ORAL_CAPSULE | Freq: Two times a day (BID) | ORAL | Status: DC
  Administered 2021-07-28: 500 mg via ORAL
  Filled 2021-07-27: qty 1

## 2021-07-27 MED ORDER — FUROSEMIDE 10 MG/ML IJ SOLN
20.0000 mg | Freq: Once | INTRAMUSCULAR | Status: AC
  Administered 2021-07-27: 13:00:00 20 mg via INTRAVENOUS
  Filled 2021-07-27: qty 2

## 2021-07-27 MED ORDER — FUROSEMIDE 10 MG/ML IJ SOLN
20.0000 mg | Freq: Two times a day (BID) | INTRAMUSCULAR | Status: DC
  Administered 2021-07-27: 09:00:00 20 mg via INTRAVENOUS
  Filled 2021-07-27 (×2): qty 2

## 2021-07-27 MED ORDER — BOOST / RESOURCE BREEZE PO LIQD CUSTOM
1.0000 | Freq: Two times a day (BID) | ORAL | Status: DC
  Administered 2021-07-27: 11:00:00 1 via ORAL

## 2021-07-27 MED ORDER — IPRATROPIUM-ALBUTEROL 0.5-2.5 (3) MG/3ML IN SOLN: 3.0000 mL | RESPIRATORY_TRACT | Status: DC | PRN

## 2021-07-27 MED ORDER — IPRATROPIUM-ALBUTEROL 0.5-2.5 (3) MG/3ML IN SOLN
3.0000 mL | Freq: Three times a day (TID) | RESPIRATORY_TRACT | Status: DC
  Administered 2021-07-27: 15:00:00 3 mL via RESPIRATORY_TRACT
  Filled 2021-07-27: qty 3

## 2021-07-27 NOTE — Care Management Important Message (Signed)
Important Message  Patient Details IM Letter given to the Patient. Name: Melissa [name] MRN: [id number] Date of Birth: 02/23/1946   Medicare Important Message Given:  Yes     Caren Macadam 07/27/2021, 12:12 PM

## 2021-07-27 NOTE — Progress Notes (Signed)
Patient ID: Melissa [name], female   DOB: 07-10-1946, 75 y.o.   MRN: [id number] [facility] Surgery Progress Note  5 Days Post-Op  Subjective: CC-  Feeling much better today. Denies any abdominal pain. Tolerating clear liquids. Denies n/v. States that she had a couple Bms yesterday and one this morning. She is told that they appear less dark bloody and more brown. Denies bright red blood in stool. Hgb 9.7 from 10.8, no tachycardia or hypotension. States that she ambulated in the halls multiple times yesterday and had no lightheadedness or dizziness. WBC 11.3 from 13.5, afebrile  Objective: Vital signs in last 24 hours: Temp:  [97.7 F (36.5 C)-99.5 F (37.5 C)] 98 F (36.7 C) (11/28 0415) Pulse Rate:  [84-90] 84 (11/28 0415) Resp:  [16-20] 20 (11/28 0415) BP: (108-136)/(47-59) 121/54 (11/28 0415) SpO2:  [93 %-96 %] 93 % (11/28 0415) Last BM Date: 07/27/21  Intake/Output from previous day: 11/27 0701 - 11/28 0700 In: 1232 [P.O.:360; I.V.:60; Blood:812] Out: 2000 [Urine:2000] Intake/Output this shift: Total I/O In: -  Out: 100 [Urine:100]  PE: Gen:  Alert, NAD, pleasant Pulm: rate and effort normal Abd: Soft, NT/ND Psych: A&Ox3 Skin: no rashes noted, warm and dry  Lab Results:  Recent Labs    07/26/21 0458 07/26/21 1836 07/27/21 0453  WBC 13.5*  --  11.3*  HGB 6.8* 10.8* 9.7*  HCT 21.2* 31.9* 29.4*  PLT 246  --  231   BMET Recent Labs    07/26/21 0458 07/27/21 0453  NA 137 138  K 3.4* 3.6  CL 107 106  CO2 22 25  GLUCOSE 131* 130*  BUN 11 8  CREATININE 0.63 0.58  CALCIUM 8.0* 8.3*   PT/INR No results for input(s): LABPROT, INR in the last 72 hours. CMP     Component Value Date/Time   NA 138 07/27/2021 0453   K 3.6 07/27/2021 0453   CL 106 07/27/2021 0453   CO2 25 07/27/2021 0453   GLUCOSE 130 (H) 07/27/2021 0453   GLUCOSE 120 (H) 08/31/2006 0832   BUN 8 07/27/2021 0453   CREATININE 0.58 07/27/2021 0453   CALCIUM 8.3 (L) 07/27/2021 0453    PROT 8.0 07/18/2021 1126   ALBUMIN 4.2 07/18/2021 1126   AST 23 07/18/2021 1126   ALT 24 07/18/2021 1126   ALKPHOS 81 07/18/2021 1126   BILITOT 0.5 07/18/2021 1126   GFRNONAA >60 07/27/2021 0453   GFRAA >60 11/11/2015 0609   Lipase  No results found for: LIPASE     Studies/Results: DG CHEST PORT 1 VIEW  Result Date: 07/25/2021 CLINICAL DATA:  Fever.  Some rectal bleeding. EXAM: PORTABLE CHEST 1 VIEW COMPARISON:  04/06/2012. FINDINGS: Cardiac silhouette is normal in size. No mediastinal or hilar masses. Clear lungs.  No convincing pleural effusion and no pneumothorax. Skeletal structures are grossly intact. IMPRESSION: No active disease. Electronically Signed   By: Amie Portland M.D.   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 Tolerating clear liquids. Stools darker/ more brown per patient. Continue close observation. GI considering repeat colonoscopy if concern for further bleeding. We will follow.   ID - rocephin 11/26>> FEN - CLD VTE - SCDs only due to bleed Foley - none  UTI - on IV  rocephin HTN DM-2 HLD   LOS: 8 days    Franne Forts, [facility] 07/27/2021, 9:54 AM Please see Amion for pager number during day hours 7:00am-4:30pm

## 2021-07-27 NOTE — Progress Notes (Signed)
PROGRESS NOTE    Melissa [name]  ZOX:[id number] DOB: [birth year] DOA: 07/18/2021 PCP: Marden Noble, MD    Chief Complaint  Patient presents with   Rectal Bleeding    Brief Narrative:  75 y.o. female with history of prior GI bleed in 2017 and subsequent colonoscopy in 2018 was unremarkable with history of diabetes mellitus hypertension and hyperlipidemia presents to the ER with complaints of having multiple episodes of bleeding per rectum bright blood per rectum.  Denies any abdominal pain vomiting. In the ED, CTA without active bleed seen. 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 -Initial CT angiogram abdomen and pelvis was negative for acute bleed but follow-up nuclear medicine bleeding scan noted possible bleed at the splenic flexure 07/19/2021. -IR consulted but initially patient declined embolization at that time. (Per Dr Ashok Pall husband stated embolization was advised by NP however they did not trust NP's) -Hemoglobin continues to trend down, patient noted to have continued bleeding and subsequently agreed to embolization. -Prior provider had discussed with IR who recommended repeat CT angio for bleed, if negative then will need colonoscopy. -Repeat CT angio was negative so colonoscopy performed 07/22/2021 with copious blood in the colon with no source identified. -Patient transfused 2 units packed red blood cells 07/21/2021 with hemoglobin dropping to the 60s with continued blood movements on 1123 after colonoscopy however hemoglobin stabilized at 8. -Patient noted to have continued bleeding throughout the day 07/23/2021  hemoglobin was down to 6.7 and patient transfused 3 more units of packed red blood cells with a total of 5 units of packed red blood cells transfused. -Tagged red blood cell scan done 07/23/2021 showed bleeding at the splenic flexure, IR consulted perform angiography with embolization to middle colonic artery on 07/23/2021, also advised transfusion 1 unit platelets which was done. -Since embolization bleeding seem to be slowing down however patient stated had small dark red stool this morning. -Hemoglobin at 6.8 (07/26/2021 ) from 7.0. -Status post transfusion 2 units packed red blood cells with hemoglobin currently at 9.7 this morning.  -Patient noted after procedure to have developed 8/10 periumbilical pain, case discussed with GI and IR who recommended general surgery consultation.  Abdominal pain improved, general surgery following and do not think significant ischemia at this time and not currently following and recommending continuation of clear liquid diet with concern for low-grade bleeding likely from proximal transverse colon and recommendations included continued observation, consideration for repeat colonoscopy, consideration for repeat bleeding scan, consideration for repeat IR angiography with further embolization with surgical resection as the last resort if all options exhausted.. -GI following and feel patient is not having fulminant rebleeding and recommending medical management and supportive care at this time for the next 1 to 2 days, and if patient develops more compelling evidence of active rebleeding may consider repeat colonoscopy. -GI, general surgery following and appreciate input and recommendations.  2.  Fever likely secondary to Klebsiella pneumoniae UTI -Patient noted to have a temp of 102.1 on 07/25/2021. -Urinalysis done concerning for UTI. -Urine cultures with > 100,000 colonies of Klebsiella pneumonia.   -Continue IV Rocephin through today and transition to oral  cefadroxil tomorrow.  3.  Hypertension -Blood pressure soft but slowly improving.. -Continue to hold oral antihypertensive medications. -Hydralazine as needed.  4.  Diabetes mellitus type 2 -Hemoglobin A1c noted at 7.2 (11/10/2015) -CBG 157 this morning. -Hemoglobin A1c pending. -Continue to hold oral hypoglycemic agents. -SSI.  5.  Hyperlipidemia -Continue statin.  6.  Hypokalemia  -Repleted.  Potassium at 3.6. -K-Dur 40 mEq p.o. x1 as patient on IV Lasix today with some complaints of cramping.    DVT prophylaxis: SCDs Code Status: Full Family Communication: Updated patient, husband at bedside. Disposition:   Status is: Inpatient  Remains inpatient appropriate because: Severity of illness.       Consultants:  Gastroenterology: Dr. Marca Ancona 07/19/2021 Interventional radiology: Dr. Loreta Ave 07/19/2021 General surgery: Dr. Gerrit Friends 07/24/2021  Procedures:  Chest x-ray 07/25/2021 Blood cell scan/bleeding scan 07/19/2021, 07/23/2021 Ultrasound-guided access of right common femoral artery/superior mesenteric angiogram/inferior mesenteric angiogram/left colic angiogram/middle colic angiogram and pseudoaneurysm embolization per IR, Dr. Bryn Gulling 07/23/2021 CT angiogram abdomen and pelvis 07/21/2021 Transfuse 5 units packed red blood cells 07/23/2021-07/24/2021 Transfused 2 units packed red blood cells 07/26/2021  Antimicrobials:  IV Rocephin 07/25/2021>>>>> 07/27/2021 Cefadroxil 07/28/2021>>>>    Subjective: Sitting up in bed.  Feeling better.  Denies any chest pain.  No significant shortness of breath.  Husband at bedside stated patient ambulated in hallways without any significant shortness of breath.  Per patient states RN stated she had some wheezing noted however patient states wears hearing aids and cannot hear herself wheeze at this time.  States stool is becoming more dark with no bright red blood per rectum today.    Objective: Vitals:   07/26/21 1655 07/26/21 2034  07/27/21 0415 07/27/21 1211  BP: (!) 127/59 (!) 136/53 (!) 121/54 (!) 142/61  Pulse: 87 88 84 91  Resp: 16 20 20 20   Temp: 99.3 F (37.4 C) 98.5 F (36.9 C) 98 F (36.7 C) 97.9 F (36.6 C)  TempSrc: Oral     SpO2: 95% 96% 93% 98%  Weight:      Height:        Intake/Output Summary (Last 24 hours) at 07/27/2021 1213 Last data filed at 07/27/2021 1053 Gross per 24 hour  Intake 992 ml  Output 2600 ml  Net -1608 ml    Filed Weights   07/18/21 1051  Weight: 87.1 kg    Examination:  General exam: NAD. Respiratory system: Some decreased breath sounds in the bases.  Some crackles noted in the bases.  Minimal expiratory wheezing.  Cardiovascular system: Regular rate and rhythm no murmurs rubs or gallops.  No JVD.  No lower extremity edema.  Gastrointestinal system: Abdomen is soft, nondistended, nontender, positive bowel sounds.  No rebound.  No guarding. Central nervous system: Alert and oriented.  Moving extremities spontaneously.  No focal neurological deficits.   Extremities: Symmetric 5 x 5 power. Skin: No rashes, lesions or ulcers Psychiatry: Judgement and insight appear normal. Mood & affect appropriate.     Data Reviewed: I have personally reviewed following labs and imaging studies  CBC: Recent Labs  Lab 07/23/21 0511 07/23/21 1140 07/24/21 0032 07/24/21 0618 07/25/21 0510 07/25/21 1444 07/25/21 2143 07/26/21 0458 07/26/21 1836 07/27/21 0453  WBC 5.1  --  6.4  --  14.4*  --   --  13.5*  --  11.3*  HGB 6.7*   < > 7.7*  7.8*   < > 7.3* 7.2* 7.0* 6.8* 10.8* 9.7*  HCT 20.4*  --  24.4*  --  22.3*  --   --  21.2* 31.9* 29.4*  MCV 97.6  --  97.6  --  96.5  --   --  95.9  --  93.6  PLT 211  --  194  --  254  --   --  246  --  231   < > = values in this interval not displayed.     Basic Metabolic Panel: Recent Labs  Lab 07/23/21 0511 07/24/21 0032 07/24/21 1243 07/25/21 0510 07/26/21 0458 07/27/21 0453  NA 140 138  --  136 137 138  K 3.7 3.4*  --  3.8  3.4* 3.6  CL 110 108  --  106 107 106  CO2 24 22  --  21* 22 25  GLUCOSE 152* 150*  --  132* 131* 130*  BUN 9 8  --  9 11 8   CREATININE 0.61 0.66  --  0.75 0.63 0.58  CALCIUM 8.3* 8.2*  --  7.8* 8.0* 8.3*  MG  --   --  1.6*  --  1.9  --      GFR: Estimated Creatinine Clearance: 61.6 mL/min (by C-G formula based on SCr of 0.58 mg/dL).  Liver Function Tests: No results for input(s): AST, ALT, ALKPHOS, BILITOT, PROT, ALBUMIN in the last 168 hours.  CBG: Recent Labs  Lab 07/26/21 2031 07/27/21 0021 07/27/21 0413 07/27/21 0728 07/27/21 1127  GLUCAP 182* 126* 134* 157* 207*      Recent Results (from the past 240 hour(s))  Resp Panel by RT-PCR (Flu A&B, Covid) Nasopharyngeal Swab     Status: None   Collection Time: 07/18/21  6:30 PM   Specimen: Nasopharyngeal Swab; Nasopharyngeal(NP) swabs in vial transport medium  Result Value Ref Range Status   SARS Coronavirus 2 by RT PCR NEGATIVE NEGATIVE Final    Comment: (NOTE) SARS-CoV-2 target nucleic acids are NOT DETECTED.  The SARS-CoV-2 RNA is generally detectable in upper respiratory specimens during the acute phase of infection. The lowest concentration of SARS-CoV-2 viral copies this assay can detect is 138 copies/mL. A negative result does not preclude SARS-Cov-2 infection and should not be used as the sole basis for treatment or other patient management decisions. A negative result may occur with  improper specimen collection/handling, submission of specimen other than nasopharyngeal swab, presence of viral mutation(s) within the areas targeted by this assay, and inadequate number of viral copies(<138 copies/mL). A negative result must be combined with clinical observations, patient history, and epidemiological information. The expected result is Negative.  Fact Sheet for Patients:  07/20/21  Fact Sheet for Healthcare Providers:  BloggerCourse.com  This test  is no t yet approved or cleared by the [place] FDA and  has been authorized for detection and/or diagnosis of SARS-CoV-2 by FDA under an Emergency Use Authorization (EUA). This EUA will remain  in effect (meaning this test can be used) for the duration of the COVID-19 declaration under Section 564(b)(1) of the Act, 21 U.S.C.section 360bbb-3(b)(1), unless the authorization is terminated  or revoked sooner.       Influenza A by PCR NEGATIVE NEGATIVE Final   Influenza B by PCR NEGATIVE NEGATIVE Final    Comment: (NOTE) The Xpert Xpress SARS-CoV-2/FLU/RSV plus assay is intended as an aid in the diagnosis of influenza from Nasopharyngeal swab specimens and should not be used as a sole basis for treatment. Nasal washings and aspirates are unacceptable for Xpert Xpress SARS-CoV-2/FLU/RSV testing.  Fact Sheet for Patients: [place]  Fact Sheet for Healthcare Providers: BloggerCourse.com  This test is not yet  approved or cleared by the [place] and has been authorized for detection and/or diagnosis of SARS-CoV-2 by FDA under an Emergency Use Authorization (EUA). This EUA will remain in effect (meaning this test can be used) for the duration of the COVID-19 declaration under Section 564(b)(1) of the Act, 21 U.S.C. section 360bbb-3(b)(1), unless the authorization is terminated or revoked.  Performed at [facility], [address] [place], [place] [address]   Resp Panel by RT-PCR (Flu A&B, Covid) Nasopharyngeal Swab     Status: None   Collection Time: 07/25/21  2:00 PM   Specimen: Nasopharyngeal Swab; Nasopharyngeal(NP) swabs in vial transport medium  Result Value Ref Range Status   SARS Coronavirus 2 by RT PCR NEGATIVE NEGATIVE Final    Comment: (NOTE) SARS-CoV-2 target nucleic acids are NOT DETECTED.  The SARS-CoV-2 RNA is generally detectable in upper respiratory specimens during the acute  phase of infection. The lowest concentration of SARS-CoV-2 viral copies this assay can detect is 138 copies/mL. A negative result does not preclude SARS-Cov-2 infection and should not be used as the sole basis for treatment or other patient management decisions. A negative result may occur with  improper specimen collection/handling, submission of specimen other than nasopharyngeal swab, presence of viral mutation(s) within the areas targeted by this assay, and inadequate number of viral copies(<138 copies/mL). A negative result must be combined with clinical observations, patient history, and epidemiological information. The expected result is Negative.  Fact Sheet for Patients:  BloggerCourse.com  Fact Sheet for Healthcare Providers:  [employer]  This test is no t yet approved or cleared by the [place] FDA and  has been authorized for detection and/or diagnosis of SARS-CoV-2 by FDA under an Emergency Use Authorization (EUA). This EUA will remain  in effect (meaning this test can be used) for the duration of the COVID-19 declaration under Section 564(b)(1) of the Act, 21 U.S.C.section 360bbb-3(b)(1), unless the authorization is terminated  or revoked sooner.       Influenza A by PCR NEGATIVE NEGATIVE Final   Influenza B by PCR NEGATIVE NEGATIVE Final    Comment: (NOTE) The Xpert Xpress SARS-CoV-2/FLU/RSV plus assay is intended as an aid in the diagnosis of influenza from Nasopharyngeal swab specimens and should not be used as a sole basis for treatment. Nasal washings and aspirates are unacceptable for Xpert Xpress SARS-CoV-2/FLU/RSV testing.  Fact Sheet for Patients: BloggerCourse.com  Fact Sheet for Healthcare Providers: [employer]ious[employer]ker.it  This test is not yet approved or cleared by the [place] FDA and has been authorized for detection and/or diagnosis of  SARS-CoV-2 by FDA under an Emergency Use Authorization (EUA). This EUA will remain in effect (meaning this test can be used) for the duration of the COVID-19 declaration under Section 564(b)(1) of the Act, 21 U.S.C. section 360bbb-3(b)(1), unless the authorization is terminated or revoked.  Performed at [facility], [address], [place], [place] [address]   Culture, blood (routine x 2)     Status: None (Preliminary result)   Collection Time: 07/25/21  2:44 PM   Specimen: BLOOD  Result Value Ref Range Status   Specimen Description   Final    BLOOD LEFT ANTECUBITAL Performed at [facility], [address] [place], [place] [address]    Special Requests   Final    BOTTLES DRAWN AEROBIC AND ANAEROBIC Blood Culture adequate volume Performed at [facility], [address] [place], [place] [address]    Culture  Final    NO GROWTH 2 DAYS Performed at [facility], [address] [place], [place] [address]    Report Status PENDING  Incomplete  Culture, blood (routine x 2)     Status: None (Preliminary result)   Collection Time: 07/25/21  2:44 PM   Specimen: BLOOD  Result Value Ref Range Status   Specimen Description   Final    BLOOD BLOOD RIGHT HAND Performed at [facility], [address] [place], [place] [address]    Special Requests   Final    BOTTLES DRAWN AEROBIC AND ANAEROBIC Blood Culture adequate volume Performed at [facility], [address] [place], [place] [address]    Culture   Final    NO GROWTH 2 DAYS Performed at [facility], [address], [place], [place] [address]    Report Status PENDING  Incomplete  Urine Culture     Status: Abnormal   Collection Time: 07/25/21  5:02 PM   Specimen: Urine, Clean Catch  Result Value Ref Range Status   Specimen Description   Final    URINE, CLEAN CATCH Performed at [facility], [address], [place], [place] [address]    Special Requests   Final    NONE Performed at [facility], [address], [place], [place] [address]    Culture >=100,000 COLONIES/mL KLEBSIELLA PNEUMONIAE (A)  Final   Report Status 07/27/2021 FINAL  Final   Organism ID, Bacteria KLEBSIELLA PNEUMONIAE (A)  Final      Susceptibility   Klebsiella pneumoniae - MIC*    AMPICILLIN >=32 RESISTANT Resistant     CEFAZOLIN <=4 SENSITIVE Sensitive     CEFEPIME <=0.12 SENSITIVE Sensitive     CEFTRIAXONE <=0.25 SENSITIVE Sensitive     CIPROFLOXACIN <=0.25 SENSITIVE Sensitive     GENTAMICIN <=1 SENSITIVE Sensitive     IMIPENEM <=0.25 SENSITIVE Sensitive     NITROFURANTOIN <=16 SENSITIVE Sensitive     TRIMETH/SULFA <=20 SENSITIVE Sensitive     AMPICILLIN/SULBACTAM 4 SENSITIVE Sensitive     PIP/TAZO <=4 SENSITIVE Sensitive     * >=100,000 COLONIES/mL KLEBSIELLA PNEUMONIAE          Radiology Studies: DG CHEST PORT 1 VIEW  Result Date: 07/25/2021 CLINICAL DATA:  Fever.  Some rectal bleeding. EXAM: PORTABLE CHEST 1 VIEW COMPARISON:  04/06/2012. FINDINGS: Cardiac silhouette is normal in size. No mediastinal or hilar masses. Clear lungs.  No convincing pleural effusion and no pneumothorax. Skeletal structures are grossly intact. IMPRESSION: No active disease. Electronically Signed   By: Amie Portland M.D.   On: 07/25/2021 14:42        Scheduled Meds:  feeding supplement  1 Container Oral BID BM   furosemide  20 mg Intravenous BID   insulin aspart  0-9 Units Subcutaneous Q4H   pantoprazole  40 mg Oral Daily   rosuvastatin  5 mg Oral QHS   Continuous Infusions:  cefTRIAXone (ROCEPHIN)  IV 2 g (07/26/21 2030)     LOS: 8 days    Time spent: 40 minutes    Ramiro Harvest, MD [facility]   To contact the attending provider between 7A-7P or the covering provider during after hours 7P-7A, please log into the web site [email or web address] and access using universal [facility] password for that web site. If you do not have the password, please call the hospital operator.  07/27/2021, 12:13 PM   Patient

## 2021-07-27 NOTE — Progress Notes (Signed)
[facility] Gastroenterology Progress Note  Melissa [name] 75 y.o. October 22, 1945  CC:  Hematochezia   Subjective: Patient states she is still having rectal bleeding overnight, though it appears to be smaller amount than previously. States it is dark and red blood mixed. Small clots seen with bowel movements. Most recently, last two bowel movements this morning had no blood. She is tolerating clears well and would like to try full liquids. Having some right sided abdominal pain that is improving.  ROS : Review of Systems  Cardiovascular:  Negative for chest pain and palpitations.  Gastrointestinal:  Positive for abdominal pain and blood in stool. Negative for constipation, diarrhea, heartburn, melena, nausea and vomiting.     Objective: Vital signs in last 24 hours: Vitals:   07/26/21 2034 07/27/21 0415  BP: (!) 136/53 (!) 121/54  Pulse: 88 84  Resp: 20 20  Temp: 98.5 F (36.9 C) 98 F (36.7 C)  SpO2: 96% 93%    Physical Exam:  General:  Alert, cooperative, no distress, husband at bedside  Head:  Normocephalic, without obvious abnormality, atraumatic  Eyes:  Anicteric sclera, EOM's intact, conjunctival pallor  Lungs:   Clear to auscultation bilaterally, respirations unlabored  Heart:  Regular rate and rhythm, S1, S2 normal  Abdomen:   Soft, non-tender, bowel sounds active all four quadrants,  no masses,     Lab Results: Recent Labs    07/24/21 1243 07/25/21 0510 07/26/21 0458 07/27/21 0453  NA  --    < > 137 138  K  --    < > 3.4* 3.6  CL  --    < > 107 106  CO2  --    < > 22 25  GLUCOSE  --    < > 131* 130*  BUN  --    < > 11 8  CREATININE  --    < > 0.63 0.58  CALCIUM  --    < > 8.0* 8.3*  MG 1.6*  --  1.9  --    < > = values in this interval not displayed.   No results for input(s): AST, ALT, ALKPHOS, BILITOT, PROT, ALBUMIN in the last 72 hours. Recent Labs    07/26/21 0458 07/26/21 1836 07/27/21 0453  WBC 13.5*  --  11.3*  HGB 6.8* 10.8* 9.7*  HCT 21.2* 31.9*  29.4*  MCV 95.9  --  93.6  PLT 246  --  231   No results for input(s): LABPROT, INR in the last 72 hours.    Assessment Hematochezia, anemia, acute blood loss; diverticular bleeding versus vascular abnormaility - HGB 9.7 (10.8 11/27), MCV 93.6 - BUN 8, Cr 0.58 - Colonoscopy 11/23: unrevealing, difficult to visualize due to blood - Tagged RBC scan 11/24 showed bleeding at the splenic flexure, IR consulted and performed angiography with emobolization to middle colonic artery.  Fever secondary to UTI  DM II  HTN  Plan: Likely diverticular bleed. Appears to be improving. Recommend increasing diet to soft and see how patient tolerates. Continue to watch for rectal bleeding. If continuing to improve with no recurrence, can discharge tomorrow from GI standpoint.  Viyan Rosamond Leanna Sato PA-C 07/27/2021, 10:15 AM  Contact #  [phone]

## 2021-07-27 NOTE — Plan of Care (Signed)
  Problem: Activity: Goal: Risk for activity intolerance will decrease Outcome: Progressing   Problem: Safety: Goal: Ability to remain free from injury will improve Outcome: Progressing   

## 2021-07-28 DIAGNOSIS — R509 Fever, unspecified: Secondary | ICD-10-CM

## 2021-07-28 LAB — CBC
HCT: 30 % — ABNORMAL LOW (ref 36.0–46.0)
Hemoglobin: 9.9 g/dL — ABNORMAL LOW (ref 12.0–15.0)
MCH: 30.7 pg (ref 26.0–34.0)
MCHC: 33 g/dL (ref 30.0–36.0)
MCV: 93.2 fL (ref 80.0–100.0)
Platelets: 274 10*3/uL (ref 150–400)
RBC: 3.22 MIL/uL — ABNORMAL LOW (ref 3.87–5.11)
RDW: 14.6 % (ref 11.5–15.5)
WBC: 7.7 10*3/uL (ref 4.0–10.5)
nRBC: 0 % (ref 0.0–0.2)

## 2021-07-28 LAB — BASIC METABOLIC PANEL
Anion gap: 5 (ref 5–15)
BUN: 9 mg/dL (ref 8–23)
CO2: 27 mmol/L (ref 22–32)
Calcium: 8.8 mg/dL — ABNORMAL LOW (ref 8.9–10.3)
Chloride: 105 mmol/L (ref 98–111)
Creatinine, Ser: 0.71 mg/dL (ref 0.44–1.00)
GFR, Estimated: >60 mL/min (ref >60)
Glucose, Bld: 142 mg/dL — ABNORMAL HIGH (ref 70–99)
Potassium: 3.9 mmol/L (ref 3.5–5.1)
Sodium: 137 mmol/L (ref 135–145)

## 2021-07-28 LAB — GLUCOSE, CAPILLARY
Glucose-Capillary: 135 mg/dL — ABNORMAL HIGH (ref 70–99)
Glucose-Capillary: 146 mg/dL — ABNORMAL HIGH (ref 70–99)
Glucose-Capillary: 151 mg/dL — ABNORMAL HIGH (ref 70–99)
Glucose-Capillary: 169 mg/dL — ABNORMAL HIGH (ref 70–99)

## 2021-07-28 MED ORDER — LOSARTAN POTASSIUM 100 MG PO TABS: 100.0000 mg | ORAL_TABLET | Freq: Every evening | ORAL | Status: DC

## 2021-07-28 MED ORDER — OMEPRAZOLE MAGNESIUM 20 MG PO TBEC: 40.0000 mg | DELAYED_RELEASE_TABLET | Freq: Every day | ORAL | 1 refills | Status: AC

## 2021-07-28 MED ORDER — CEFADROXIL 500 MG PO CAPS: 500.0000 mg | ORAL_CAPSULE | Freq: Two times a day (BID) | ORAL | 0 refills | Status: AC

## 2021-07-28 NOTE — Progress Notes (Signed)
Patient ID: Melissa [name], female   DOB: 01/04/1946, 75 y.o.   MRN: [id number] [facility] Surgery Progress Note  6 Days Post-Op  Subjective: CC-  No complaints this morning. Denies abdominal pain, nausea, vomiting. Tolerating PO. States that she had a BM this morning that was 95% normal, no bright red blood in stool. Hgb 9.9 from 9.7  Objective: Vital signs in last 24 hours: Temp:  [97.9 F (36.6 C)-98.9 F (37.2 C)] 98.9 F (37.2 C) (11/29 0503) Pulse Rate:  [80-91] 80 (11/29 0503) Resp:  [17-20] 17 (11/29 0503) BP: (124-142)/(50-61) 124/50 (11/29 0503) SpO2:  [93 %-98 %] 93 % (11/29 0503) Last BM Date: 07/27/21  Intake/Output from previous day: 11/28 0701 - 11/29 0700 In: 600 [P.O.:600] Out: 1700 [Urine:1700] Intake/Output this shift: No intake/output data recorded.  PE: Gen:  Alert, NAD, pleasant Pulm: rate and effort normal Abd: Soft, NT/ND Psych: A&Ox3 Skin: no rashes noted, warm and dry  Lab Results:  Recent Labs    07/27/21 0453 07/28/21 0438  WBC 11.3* 7.7  HGB 9.7* 9.9*  HCT 29.4* 30.0*  PLT 231 274   BMET Recent Labs    07/27/21 0453 07/28/21 0438  NA 138 137  K 3.6 3.9  CL 106 105  CO2 25 27  GLUCOSE 130* 142*  BUN 8 9  CREATININE 0.58 0.71  CALCIUM 8.3* 8.8*   PT/INR No results for input(s): LABPROT, INR in the last 72 hours. CMP     Component Value Date/Time   NA 137 07/28/2021 0438   K 3.9 07/28/2021 0438   CL 105 07/28/2021 0438   CO2 27 07/28/2021 0438   GLUCOSE 142 (H) 07/28/2021 0438   GLUCOSE 120 (H) 08/31/2006 0832   BUN 9 07/28/2021 0438   CREATININE 0.71 07/28/2021 0438   CALCIUM 8.8 (L) 07/28/2021 0438   PROT 8.0 07/18/2021 1126   ALBUMIN 4.2 07/18/2021 1126   AST 23 07/18/2021 1126   ALT 24 07/18/2021 1126   ALKPHOS 81 07/18/2021 1126   BILITOT 0.5 07/18/2021 1126   GFRNONAA >60 07/28/2021 0438   GFRAA >60 11/11/2015 0609   Lipase  No results found for: LIPASE     Studies/Results: DG Chest 2  View  Result Date: 07/27/2021 CLINICAL DATA:  Wheezing EXAM: CHEST - 2 VIEW COMPARISON:  07/25/2021 FINDINGS: The heart size and mediastinal contours are within normal limits. Both lungs are clear. No pleural effusion. The visualized skeletal structures are unremarkable. IMPRESSION: No active cardiopulmonary disease. Electronically Signed   By: Guadlupe Spanish M.D.   On: 07/27/2021 14:02    Anti-infectives: Anti-infectives (From admission, onward)    Start     Dose/Rate Route Frequency Ordered Stop   07/28/21 1000  cefadroxil (DURICEF) capsule 500 mg        500 mg Oral 2 times daily 07/27/21 1612     07/26/21 2000  cefTRIAXone (ROCEPHIN) 2 g in sodium chloride 0.9 % 100 mL IVPB        2 g 200 mL/hr over 30 Minutes Intravenous Every 24 hours 07/26/21 0837 07/27/21 2117   07/25/21 2000  cefTRIAXone (ROCEPHIN) 1 g in sodium chloride 0.9 % 100 mL IVPB  Status:  Discontinued        1 g 200 mL/hr over 30 Minutes Intravenous Every 24 hours 07/25/21 1843 07/26/21 0837        Assessment/Plan Lower GI bleed  - 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 Abdominal exam benign and patient is tolerating a diet. No indication for surgery.    ID - rocephin 11/26>>11/28, cefadroxil 11/28>> FEN - soft diet VTE - SCDs only due to bleed Foley - none   UTI - on cefadroxil HTN DM-2 HLD   LOS: 9 days    Franne Forts, [facility] Surgery 07/28/2021, 8:49 AM Please see Amion for pager number during day hours 7:00am-4:30pm

## 2021-07-28 NOTE — Progress Notes (Signed)
Patient scheduled for Lasix, Patient stated that she refused with Lasix due to constant urination during the night. Patient assessment consist of  clear diminished, absent wheezing, and no shortness of breath.  Currently on Room air and oxygen saturation WNL. Provider on call was contact and notify about patient refusal. Patient educated on Lasix.

## 2021-07-28 NOTE — Discharge Summary (Signed)
Physician Discharge Summary  Melissa [name] [id number] DOB: [birth year] DOA: 07/18/2021  PCP: Josetta Huddle, MD  Admit date: 07/18/2021 Discharge date: 07/28/2021  Time spent: 60 minutes  Recommendations for Outpatient Follow-up:  Follow-up with Josetta Huddle, MD in 2 weeks.  On follow-up patient will need a CBC done to follow-up on hemoglobin.  Patient also need a basic metabolic profile done to follow-up on electrolytes and renal function.  Patient's blood pressure need to be reassessed on follow-up. Follow-up with Dr. Paulita Fujita, gastroenterology in 2 weeks.   Discharge Diagnoses:  Principal Problem:   Acute GI bleeding Active Problems:   Essential hypertension   Diabetes mellitus with complication (HCC)   Acute blood loss anemia   Acute lower UTI   Discharge Condition: Stable and improved  Diet recommendation: Heart healthy  Filed Weights   07/18/21 1051  Weight: 87.1 kg    History of present illness:  HPI per Dr. Ebony Hail is a 75 y.o. female with history of prior GI bleed in 2017 and subsequent colonoscopy in 2018 was unremarkable with history of diabetes mellitus hypertension and hyperlipidemia presents to the ER with complaints of having multiple episodes of bleeding per rectum bright blood per rectum.  Denies any abdominal pain vomiting.  Patient symptoms started this morning.  Patient does take baby aspirin.   ED Course: While in the ER patient had another 2 episodes of bright red blood per rectum.  Hemodynamically stable.  CT angiogram of the abdomen did not show any active bleed.  Did show diverticulosis.  Labs show hemoglobin of 12.7 stool for occult blood was negative per ER physician discussed with on-call gastroenterologist Dr. Therisa Doyne will be seeing patient in consult.  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 -Initial CT angiogram abdomen and pelvis was negative for acute bleed but follow-up nuclear medicine bleeding scan noted possible bleed at the splenic flexure 07/19/2021. -IR consulted but initially patient declined embolization at that time. (Per Dr Si Raider husband stated embolization was advised by NP however they did not trust NP's) -Hemoglobin continued to trend down, patient noted to have continued bleeding and subsequently agreed to embolization. -Prior provider had discussed with IR who recommended repeat CT angio for bleed, if negative then will need colonoscopy. -Repeat CT angio was negative so colonoscopy performed 07/22/2021 with copious blood in the colon with no source identified. -Patient transfused 2 units packed red blood cells 07/21/2021 with hemoglobin dropping to the 6s with continued blood movements on 11/23 after colonoscopy however hemoglobin stabilized at 8. -Patient noted to have continued bleeding throughout the day 07/23/2021 hemoglobin was down to 6.7 and patient transfused 3 more units of packed red blood cells with a total of 5 units of packed red blood cells transfused early on in the hospitalization. -Tagged red blood cell scan done 07/23/2021 showed bleeding at the splenic flexure, IR consulted perform angiography with embolization to middle colonic artery on 07/23/2021, also advised transfusion 1 unit platelets which was done. -Since embolization bleeding seem to be slowing down.. -Hemoglobin at 6.8 (07/26/2021 ) from 7.0. -Status post transfusion 2 units packed red blood cells with hemoglobin stabilizing at 9.9 by day of discharge.  -Patient noted after procedure to have developed [id number] periumbilical pain, case discussed with GI and IR who recommended general surgery consultation.  Abdominal pain improved, general surgery following and do not think significant  ischemia at this time and not currently recommending any surgical intervention with  continuation of clear liquid diet with concern for low-grade bleeding likely from proximal transverse colon and recommendations included continued observation, consideration for repeat colonoscopy, consideration for repeat bleeding scan, consideration for repeat IR angiography with further embolization with surgical resection as the last resort if all options exhausted.. -GI following and feel patient is not having fulminant rebleeding and recommending medical management and supportive care at this time. -Patient's diet was advanced to a soft diet which he tolerated, patient had no further bleeding episodes and had normal stool by day of discharge.  Hemoglobin has stabilized at 9.9.  -Patient cleared by general surgery and GI for discharge with outpatient follow-up with primary gastroenterologist, Dr. Paulita Fujita.  -Patient discharged in stable and improved condition. .  2.  Fever likely secondary to Klebsiella pneumoniae UTI -Patient noted to have a temp of 102.1 on 07/25/2021. -Urinalysis done concerning for UTI. -Urine cultures with > 100,000 colonies of Klebsiella pneumonia.   -Patient placed on IV Rocephin and transition to oral cefadroxil which she tolerated, patient will be discharged home on 4 more days of oral antibiotics to complete a 7-day course of treatment.   -Outpatient follow-up with PCP.   3.  Hypertension -Blood pressure soft early on in the hospitalization but improved.   -Patient's oral antihypertensive medications were held during the hospitalization and ARB will be resumed while week post discharge.  -Spironolactone discontinued.  -Outpatient follow-up with PCP.   4.  Diabetes mellitus type 2 -Hemoglobin A1c noted at 7.2 (11/10/2015) -Patient maintained on sliding scale insulin. -Patient's oral hypoglycemic agents were held during the hospitalization and will be resumed on discharge.   5.  Hyperlipidemia -Patient maintained on home regimen statin.  6.  Hypokalemia   -Repleted during the hospitalization. -Outpatient follow-up with PCP.    Procedures: Chest x-ray 07/25/2021 Blood cell scan/bleeding scan 07/19/2021, 07/23/2021 Ultrasound-guided access of right common femoral artery/superior mesenteric angiogram/inferior mesenteric angiogram/left colic angiogram/middle colic angiogram and pseudoaneurysm embolization per IR, Dr. Dwaine Gale 07/23/2021 CT angiogram abdomen and pelvis 07/21/2021 Transfuse 5 units packed red blood cells 07/23/2021-07/24/2021 Transfused 2 units packed red blood cells 07/26/2021   Consultations: Gastroenterology: Dr. Therisa Doyne 07/19/2021 Interventional radiology: Dr. Earleen Newport 07/19/2021 General surgery: Dr. Harlow Asa 07/24/2021  Discharge Exam: Vitals:   07/27/21 1455 07/28/21 0503  BP:  (!) 124/50  Pulse:  80  Resp:  17  Temp:  98.9 F (37.2 C)  SpO2: 97% 93%    General: NAD. Cardiovascular: RRR, no murmurs rubs or gallops.  No JVD.  No lower extremity edema. Respiratory: Clear to auscultation bilaterally.  No wheezes, no crackles, no rhonchi.  Normal respiratory effort.  Speaking in full sentences.  Discharge Instructions   Discharge Instructions     Diet - low sodium heart healthy   Complete by: As directed    Discharge wound care:   Complete by: As directed    As above   Increase activity slowly   Complete by: As directed       Allergies as of 07/28/2021       Reactions   Ciprofloxacin Other (See Comments)   Tendonitis and joint pain   Amlodipine Besylate Other (See Comments)   Joint pain   Atorvastatin Other (See Comments)   "Joint pains / ?gout"   Metformin Hcl Er Diarrhea   Spironolactone Other (See Comments)   "Hair changes"        Medication List     STOP taking  these medications    aspirin EC 81 MG tablet   ibuprofen 200 MG tablet Commonly known as: ADVIL   magnesium oxide 400 MG tablet Commonly known as: MAG-OX   spironolactone 25 MG tablet Commonly known as: ALDACTONE        TAKE these medications    acetaminophen 500 MG tablet Commonly known as: TYLENOL Take 500-1,000 mg by mouth every 6 (six) hours as needed for mild pain or headache.   Alpha-Lipoic Acid 600 MG Caps Take 600 mg by mouth in the morning.   Biotin 2500 MCG Caps Take 2,500 mcg by mouth daily at 2 PM.   CALCIUM 500 PO Take 1,000 mg by mouth every evening.   calcium carbonate 500 MG chewable tablet Commonly known as: TUMS - dosed in mg elemental calcium Chew 1-2 tablets by mouth at bedtime as needed for indigestion or heartburn.   cefadroxil 500 MG capsule Commonly known as: DURICEF Take 1 capsule (500 mg total) by mouth 2 (two) times daily for 4 days.   Co Q10 100 MG Caps Take 100 mg by mouth every evening.   FIBER PO Take 1 capsule by mouth in the morning and at bedtime.   FISH OIL PO Take 1 capsule by mouth every evening.   Flax Seed Oil 1000 MG Caps Take 1,000 mg by mouth every evening.   glimepiride 2 MG tablet Commonly known as: AMARYL Take 2-3 mg by mouth See admin instructions. Take 3 mg by mouth in the morning with breakfast and 2 mg with supper   losartan 100 MG tablet Commonly known as: COZAAR Take 1 tablet (100 mg total) by mouth every evening. Start taking on: August 04, 2021 What changed: These instructions start on August 04, 2021. If you are unsure what to do until then, ask your doctor or other care provider.   multivitamin with minerals Tabs tablet Take 1 tablet by mouth daily with breakfast.   omeprazole 20 MG tablet Commonly known as: PRILOSEC OTC Take 2 tablets (40 mg total) by mouth daily. Take 20 mg by mouth in the morning as needed for heartburn What changed:  how much to take when to take this   polyethylene glycol powder 17 GM/SCOOP powder Commonly known as: GLYCOLAX/MIRALAX Take 17 g by mouth See admin instructions. Mix 17 grams of powder into 4-8 ounces of water and drink in the morning as needed for constipation   rosuvastatin 5 MG  tablet Commonly known as: CRESTOR Take 5 mg by mouth at bedtime.   TURMERIC PO Take 1 capsule by mouth in the morning.   Vitamin B Complex Tabs Take 1 tablet by mouth in the morning.   vitamin C 500 MG tablet Commonly known as: ASCORBIC ACID Take 500 mg by mouth in the morning and at bedtime.   Vitamin D3 50 MCG (2000 UT) Tabs Take 2,000 Units by mouth daily at 2 PM.               Discharge Care Instructions  (From admission, onward)           Start     Ordered   07/28/21 0000  Discharge wound care:       Comments: As above   07/28/21 1156           Allergies  Allergen Reactions   Ciprofloxacin Other (See Comments)    Tendonitis and joint pain   Amlodipine Besylate Other (See Comments)    Joint pain   Atorvastatin Other (See  Comments)    "Joint pains / ?gout"   Metformin Hcl Er Diarrhea   Spironolactone Other (See Comments)    "Hair changes"    Follow-up Information     Josetta Huddle, MD. Schedule an appointment as soon as possible for a visit in 2 week(s).   Specialty: Internal Medicine Contact information: [address] [place] [address] [phone]         Arta Silence, MD. Schedule an appointment as soon as possible for a visit in 2 week(s).   Specialty: Gastroenterology Why: f/u in 2-3 weeks. Contact information: [address] [place] [address] [phone]                  The results of significant diagnostics from this hospitalization (including imaging, microbiology, ancillary and laboratory) are listed below for reference.    Significant Diagnostic Studies: DG Chest 2 View  Result Date: 07/27/2021 CLINICAL DATA:  Wheezing EXAM: CHEST - 2 VIEW COMPARISON:  07/25/2021 FINDINGS: The heart size and mediastinal contours are within normal limits. Both lungs are clear. No pleural effusion. The visualized skeletal structures are unremarkable. IMPRESSION: No active cardiopulmonary  disease. Electronically Signed   By: Macy Mis M.D.   On: 07/27/2021 14:02   NM GI Blood Loss  Result Date: 07/23/2021 CLINICAL DATA:  75 year old female with GI bleeding. EXAM: NUCLEAR MEDICINE GASTROINTESTINAL BLEEDING SCAN TECHNIQUE: Sequential abdominal images were obtained following intravenous administration of Tc-35m labeled red blood cells. RADIOPHARMACEUTICALS:  23.3 mCi Tc-53m pertechnetate in-vitro labeled red cells. COMPARISON:  07/21/2021 CTA, 07/19/2021 nuclear medicine GI bleeding study and prior exams FINDINGS: There is abnormal activity within a tubular structure within the mid abdomen compatible with active GI bleeding within the transverse colon, likely in the more distal aspect of the transverse colon/splenic flexure. This abnormal activity slightly progresses towards the end of the study. IMPRESSION: Active colonic GI bleeding with probable bleeding site in the more distal aspect of the transverse colon/splenic flexure. Electronically Signed   By: Margarette Canada M.D.   On: 07/23/2021 15:13   NM GI Blood Loss  Result Date: 07/19/2021 CLINICAL DATA:  Lower gastrointestinal hemorrhage EXAM: NUCLEAR MEDICINE GASTROINTESTINAL BLEEDING SCAN TECHNIQUE: Sequential abdominal images were obtained following intravenous administration of Tc-26m labeled red blood cells. RADIOPHARMACEUTICALS:  20.8 mCi Tc-5m pertechnetate in-vitro labeled red cells. COMPARISON:  CTA 07/18/2021 FINDINGS: There is active gastrointestinal hemorrhage identified at the fifty-seventh amended involving the splenic flexure of the colon. This hemorrhage is episodic in nature but continues through the ensuing our to fill the transverse colon retrograde fashion. IMPRESSION: Active gastrointestinal hemorrhage within the splenic flexure of the colon. Note that the hemorrhages episodic in nature. Electronically Signed   By: Fidela Salisbury M.D.   On: 07/19/2021 19:27   IR Angiogram Visceral Selective  Result Date:  07/24/2021 INDICATION: 75 year old woman with brisk lower GI bleed presents to IR for mesenteric angiogram and embolization. EXAM: 1. Ultrasound-guided access of right common femoral artery 2. Superior mesenteric angiogram 3. Inferior mesenteric angiogram 4. Left colic angiogram 5. Middle colic angiogram and pseudoaneurysm embolization MEDICATIONS: None ANESTHESIA/SEDATION: Moderate (conscious) sedation was employed during this procedure. A total of Versed 2 mg and Fentanyl 100 mcg was administered intravenously by the radiology nurse. Total intra-service moderate Sedation Time: 143 minutes. The patient's level of consciousness and vital signs were monitored continuously by radiology nursing throughout the procedure under my direct supervision. CONTRAST:  141 mL Omnipaque 300 FLUOROSCOPY TIME:  Fluoroscopy Time: 40  minutes 42 seconds (3726 mGy). COMPLICATIONS: None immediate. PROCEDURE: Informed consent was obtained from the patient following explanation of the procedure, risks, benefits and alternatives. Specifically, I discussed with the patient that the risks included, but are not limited to bleeding, infection, arterial injury, and bowel infarction requiring surgical resection. The patient understands, agrees and consents for the procedure. All questions were addressed. A time out was performed prior to the initiation of the procedure. Maximal barrier sterile technique utilized including caps, mask, sterile gowns, sterile gloves, large sterile drape, hand hygiene, and chlorhexidine prep. Right groin skin prepped and draped in usual fashion. Ultrasound image documenting patency of the right common femoral artery was obtained and placed in permanent medical record. Sterile ultrasound probe cover and gel utilized throughout the procedure. Utilizing continuous ultrasound guidance, the right common femoral artery was accessed with a 21 gauge needle. 21 gauge needle exchanged for transitional dilator set over 0.018  inch guidewire. Transitional dilator set exchanged for 5 French sheath over 0.035 inch guidewire. Mickelson catheter utilized to select the superior mesenteric artery. Superior mesenteric arteriogram demonstrated replaced right hepatic artery, patent jejunal, right colic, and middle colic branches. 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 No active extravasation was seen. Mickelson catheter was then utilized to select the inferior mesenteric artery. Angiogram demonstrated patent left colic artery with some flow to the middle colic arcade through the marginal artery of [place]. STC microcatheter was advanced into the left colic and superior rectal arteries and angiograms were performed. No active extravasation was seen. Mickelson catheter was again utilized to select the superior mesenteric artery. SCC microcatheter advanced into the middle colic artery and angiogram was performed. 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 The microcatheter was advanced into the front door arterial branch and embolization was performed with a 3 mm Ruby coil. Post embolization DSA showed no significant flow to the pseudoaneurysm from the front door branch. Microcatheter was then utilized to select the back door feeder of the pseudoaneurysm. There was significant arterial spasm, therefore 100 mcg of nitro was administered slowly into the branch resulting in significant improvement of spasm. The microcatheter was advanced to the level of the pseudoaneurysm and embolization was performed with 3 and 4 mm Ruby coils. Post embolization angiogram showed no significant flow to the pseudoaneurysm. Sheath angiogram showed appropriate puncture of the common femoral artery at the level of the femoral head prosthesis. The groin was re-prepped and draped and arteriotomy was closed with 6 [place] Angio-Seal device. 5 minutes of manual compression applied at the access site  for additional hemostasis. IMPRESSION: 1. 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 The pseudoaneurysm was successfully coil embolized. 2. No active extravasation or other vascular abnormalities identified on superior or inferior mesenteric angiogram. Electronically Signed   By: Miachel Roux M.D.   On: 07/24/2021 11:32   IR Angiogram Visceral Selective  Result Date: 07/24/2021 INDICATION: 75 year old woman with brisk lower GI bleed presents to IR for mesenteric angiogram and embolization. EXAM: 1. Ultrasound-guided access of right common femoral artery 2. Superior mesenteric angiogram 3. Inferior mesenteric angiogram 4. Left colic angiogram 5. Middle colic angiogram and pseudoaneurysm embolization MEDICATIONS: None ANESTHESIA/SEDATION: Moderate (conscious) sedation was employed during this procedure. A total of Versed 2 mg and Fentanyl 100 mcg was administered intravenously by the radiology nurse. Total intra-service moderate Sedation Time: 143 minutes. The patient's level of consciousness and  vital signs were monitored continuously by radiology nursing throughout the procedure under my direct supervision. CONTRAST:  141 mL Omnipaque 300 FLUOROSCOPY TIME:  Fluoroscopy Time: 40 minutes 42 seconds (3726 mGy). COMPLICATIONS: None immediate. PROCEDURE: Informed consent was obtained from the patient following explanation of the procedure, risks, benefits and alternatives. Specifically, I discussed with the patient that the risks included, but are not limited to bleeding, infection, arterial injury, and bowel infarction requiring surgical resection. The patient understands, agrees and consents for the procedure. All questions were addressed. A time out was performed prior to the initiation of the procedure. Maximal barrier sterile technique utilized including caps, mask, sterile gowns, sterile gloves, large sterile  drape, hand hygiene, and chlorhexidine prep. Right groin skin prepped and draped in usual fashion. Ultrasound image documenting patency of the right common femoral artery was obtained and placed in permanent medical record. Sterile ultrasound probe cover and gel utilized throughout the procedure. Utilizing continuous ultrasound guidance, the right common femoral artery was accessed with a 21 gauge needle. 21 gauge needle exchanged for transitional dilator set over 0.018 inch guidewire. Transitional dilator set exchanged for 5 French sheath over 0.035 inch guidewire. Mickelson catheter utilized to select the superior mesenteric artery. Superior mesenteric arteriogram demonstrated replaced right hepatic artery, patent jejunal, right colic, and middle colic branches. 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 No active extravasation was seen. Mickelson catheter was then utilized to select the inferior mesenteric artery. Angiogram demonstrated patent left colic artery with some flow to the middle colic arcade through the marginal artery of [place]. STC microcatheter was advanced into the left colic and superior rectal arteries and angiograms were performed. No active extravasation was seen. Mickelson catheter was again utilized to select the superior mesenteric artery. SCC microcatheter advanced into the middle colic artery and angiogram was performed. 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 The microcatheter was advanced into the front door arterial branch and embolization was performed with a 3 mm Ruby coil. Post embolization DSA showed no significant flow to the pseudoaneurysm from the front door branch. Microcatheter was then utilized to select the back door feeder of the pseudoaneurysm. There was significant arterial spasm, therefore 100 mcg of nitro was administered slowly into the branch resulting in significant improvement of spasm.  The microcatheter was advanced to the level of the pseudoaneurysm and embolization was performed with 3 and 4 mm Ruby coils. Post embolization angiogram showed no significant flow to the pseudoaneurysm. Sheath angiogram showed appropriate puncture of the common femoral artery at the level of the femoral head prosthesis. The groin was re-prepped and draped and arteriotomy was closed with 6 [place] Angio-Seal device. 5 minutes of manual compression applied at the access site for additional hemostasis. IMPRESSION: 1. 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 The pseudoaneurysm was successfully coil embolized. 2. No active extravasation or other vascular abnormalities identified on superior or inferior mesenteric angiogram. Electronically Signed   By: Miachel Roux M.D.   On: 07/24/2021 11:32   IR Angiogram Selective Each Additional Vessel  Result Date: 07/24/2021 INDICATION: 75 year old woman with brisk lower GI bleed presents to IR for mesenteric angiogram and embolization. EXAM: 1. Ultrasound-guided access of right common femoral artery 2. Superior mesenteric angiogram 3. Inferior mesenteric angiogram 4. Left colic angiogram 5. Middle colic angiogram and pseudoaneurysm embolization MEDICATIONS: None ANESTHESIA/SEDATION: Moderate (conscious) sedation was employed during this procedure. A  total of Versed 2 mg and Fentanyl 100 mcg was administered intravenously by the radiology nurse. Total intra-service moderate Sedation Time: 143 minutes. The patient's level of consciousness and vital signs were monitored continuously by radiology nursing throughout the procedure under my direct supervision. CONTRAST:  141 mL Omnipaque 300 FLUOROSCOPY TIME:  Fluoroscopy Time: 40 minutes 42 seconds (3726 mGy). COMPLICATIONS: None immediate. PROCEDURE: Informed consent was obtained from the patient following explanation of the  procedure, risks, benefits and alternatives. Specifically, I discussed with the patient that the risks included, but are not limited to bleeding, infection, arterial injury, and bowel infarction requiring surgical resection. The patient understands, agrees and consents for the procedure. All questions were addressed. A time out was performed prior to the initiation of the procedure. Maximal barrier sterile technique utilized including caps, mask, sterile gowns, sterile gloves, large sterile drape, hand hygiene, and chlorhexidine prep. Right groin skin prepped and draped in usual fashion. Ultrasound image documenting patency of the right common femoral artery was obtained and placed in permanent medical record. Sterile ultrasound probe cover and gel utilized throughout the procedure. Utilizing continuous ultrasound guidance, the right common femoral artery was accessed with a 21 gauge needle. 21 gauge needle exchanged for transitional dilator set over 0.018 inch guidewire. Transitional dilator set exchanged for 5 French sheath over 0.035 inch guidewire. Mickelson catheter utilized to select the superior mesenteric artery. Superior mesenteric arteriogram demonstrated replaced right hepatic artery, patent jejunal, right colic, and middle colic branches. 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 No active extravasation was seen. Mickelson catheter was then utilized to select the inferior mesenteric artery. Angiogram demonstrated patent left colic artery with some flow to the middle colic arcade through the marginal artery of [place]. STC microcatheter was advanced into the left colic and superior rectal arteries and angiograms were performed. No active extravasation was seen. Mickelson catheter was again utilized to select the superior mesenteric artery. SCC microcatheter advanced into the middle colic artery and angiogram was performed. This again showed in the pseudoaneurysm  near the proximal transverse colon. The microcatheter was advanced into the front door arterial branch and embolization was performed with a 3 mm Ruby coil. Post embolization DSA showed no significant flow to the pseudoaneurysm from the front door branch. Microcatheter was then utilized to select the back door feeder of the pseudoaneurysm. There was significant arterial spasm, therefore 100 mcg of nitro was administered slowly into the branch resulting in significant improvement of spasm. The microcatheter was advanced to the level of the pseudoaneurysm and embolization was performed with 3 and 4 mm Ruby coils. Post embolization angiogram showed no significant flow to the pseudoaneurysm. Sheath angiogram showed appropriate puncture of the common femoral artery at the level of the femoral head prosthesis. The groin was re-prepped and draped and arteriotomy was closed with 6 [place] Angio-Seal device. 5 minutes of manual compression applied at the access site for additional hemostasis. IMPRESSION: 1. 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 The pseudoaneurysm was successfully coil embolized. 2. No active extravasation or other vascular abnormalities identified on superior or inferior mesenteric angiogram. Electronically Signed   By: Miachel Roux M.D.   On: 07/24/2021 11:32   IR US Guide Vasc Access Right  Result Date: 07/24/2021 INDICATION: 75 year old woman with brisk lower GI bleed presents to IR for mesenteric angiogram and embolization. EXAM: 1. Ultrasound-guided access of right common femoral artery 2. Superior  mesenteric angiogram 3. Inferior mesenteric angiogram 4. Left colic angiogram 5. Middle colic angiogram and pseudoaneurysm embolization MEDICATIONS: None ANESTHESIA/SEDATION: Moderate (conscious) sedation was employed during this procedure. A total of Versed 2 mg and Fentanyl 100 mcg was administered  intravenously by the radiology nurse. Total intra-service moderate Sedation Time: 143 minutes. The patient's level of consciousness and vital signs were monitored continuously by radiology nursing throughout the procedure under my direct supervision. CONTRAST:  141 mL Omnipaque 300 FLUOROSCOPY TIME:  Fluoroscopy Time: 40 minutes 42 seconds (3726 mGy). COMPLICATIONS: None immediate. PROCEDURE: Informed consent was obtained from the patient following explanation of the procedure, risks, benefits and alternatives. Specifically, I discussed with the patient that the risks included, but are not limited to bleeding, infection, arterial injury, and bowel infarction requiring surgical resection. The patient understands, agrees and consents for the procedure. All questions were addressed. A time out was performed prior to the initiation of the procedure. Maximal barrier sterile technique utilized including caps, mask, sterile gowns, sterile gloves, large sterile drape, hand hygiene, and chlorhexidine prep. Right groin skin prepped and draped in usual fashion. Ultrasound image documenting patency of the right common femoral artery was obtained and placed in permanent medical record. Sterile ultrasound probe cover and gel utilized throughout the procedure. Utilizing continuous ultrasound guidance, the right common femoral artery was accessed with a 21 gauge needle. 21 gauge needle exchanged for transitional dilator set over 0.018 inch guidewire. Transitional dilator set exchanged for 5 French sheath over 0.035 inch guidewire. Mickelson catheter utilized to select the superior mesenteric artery. Superior mesenteric arteriogram demonstrated replaced right hepatic artery, patent jejunal, right colic, and middle colic branches. 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 No active extravasation was seen. Mickelson catheter was then utilized to select the inferior mesenteric artery. Angiogram  demonstrated patent left colic artery with some flow to the middle colic arcade through the marginal artery of Drum[place]. STC microcatheter was advanced into the left colic and superior rectal arteries and angiograms were performed. No active extravasation was seen. Mickelson catheter was again utilized to select the superior mesenteric artery. SCC microcatheter advanced into the middle colic artery and angiogram was performed. 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 The microcatheter was advanced into the front door arterial branch and embolization was performed with a 3 mm Ruby coil. Post embolization DSA showed no significant flow to the pseudoaneurysm from the front door branch. Microcatheter was then utilized to select the back door feeder of the pseudoaneurysm. There was significant arterial spasm, therefore 100 mcg of nitro was administered slowly into the branch resulting in significant improvement of spasm. The microcatheter was advanced to the level of the pseudoaneurysm and embolization was performed with 3 and 4 mm Ruby coils. Post embolization angiogram showed no significant flow to the pseudoaneurysm. Sheath angiogram showed appropriate puncture of the common femoral artery at the level of the femoral head prosthesis. The groin was re-prepped and draped and arteriotomy was closed with 6 [place] Angio-Seal device. 5 minutes of manual compression applied at the access site for additional hemostasis. IMPRESSION: 1. 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 The pseudoaneurysm was successfully coil embolized. 2. No active extravasation or other vascular abnormalities identified on superior or inferior mesenteric angiogram. Electronically Signed   By: Miachel Roux M.D.   On: 07/24/2021 11:32   DG CHEST PORT 1 VIEW  Result Date:  07/25/2021 CLINICAL DATA:  Fever.  Some  rectal bleeding. EXAM: PORTABLE CHEST 1 VIEW COMPARISON:  04/06/2012. FINDINGS: Cardiac silhouette is normal in size. No mediastinal or hilar masses. Clear lungs.  No convincing pleural effusion and no pneumothorax. Skeletal structures are grossly intact. IMPRESSION: No active disease. Electronically Signed   By: Lajean Manes M.D.   On: 07/25/2021 14:42   IR EMBO ART  VEN HEMORR LYMPH EXTRAV  INC GUIDE ROADMAPPING  Result Date: 07/24/2021 INDICATION: 75 year old woman with brisk lower GI bleed presents to IR for mesenteric angiogram and embolization. EXAM: 1. Ultrasound-guided access of right common femoral artery 2. Superior mesenteric angiogram 3. Inferior mesenteric angiogram 4. Left colic angiogram 5. Middle colic angiogram and pseudoaneurysm embolization MEDICATIONS: None ANESTHESIA/SEDATION: Moderate (conscious) sedation was employed during this procedure. A total of Versed 2 mg and Fentanyl 100 mcg was administered intravenously by the radiology nurse. Total intra-service moderate Sedation Time: 143 minutes. The patient's level of consciousness and vital signs were monitored continuously by radiology nursing throughout the procedure under my direct supervision. CONTRAST:  141 mL Omnipaque 300 FLUOROSCOPY TIME:  Fluoroscopy Time: 40 minutes 42 seconds (3726 mGy). COMPLICATIONS: None immediate. PROCEDURE: Informed consent was obtained from the patient following explanation of the procedure, risks, benefits and alternatives. Specifically, I discussed with the patient that the risks included, but are not limited to bleeding, infection, arterial injury, and bowel infarction requiring surgical resection. The patient understands, agrees and consents for the procedure. All questions were addressed. A time out was performed prior to the initiation of the procedure. Maximal barrier sterile technique utilized including caps, mask, sterile gowns, sterile gloves, large sterile drape, hand hygiene, and chlorhexidine  prep. Right groin skin prepped and draped in usual fashion. Ultrasound image documenting patency of the right common femoral artery was obtained and placed in permanent medical record. Sterile ultrasound probe cover and gel utilized throughout the procedure. Utilizing continuous ultrasound guidance, the right common femoral artery was accessed with a 21 gauge needle. 21 gauge needle exchanged for transitional dilator set over 0.018 inch guidewire. Transitional dilator set exchanged for 5 French sheath over 0.035 inch guidewire. Mickelson catheter utilized to select the superior mesenteric artery. Superior mesenteric arteriogram demonstrated replaced right hepatic artery, patent jejunal, right colic, and middle colic branches. 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 No active extravasation was seen. Mickelson catheter was then utilized to select the inferior mesenteric artery. Angiogram demonstrated patent left colic artery with some flow to the middle colic arcade through the marginal artery of [place]. STC microcatheter was advanced into the left colic and superior rectal arteries and angiograms were performed. No active extravasation was seen. Mickelson catheter was again utilized to select the superior mesenteric artery. SCC microcatheter advanced into the middle colic artery and angiogram was performed. 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 The microcatheter was advanced into the front door arterial branch and embolization was performed with a 3 mm Ruby coil. Post embolization DSA showed no significant flow to the pseudoaneurysm from the front door branch. Microcatheter was then utilized to select the back door feeder of the pseudoaneurysm. There was significant arterial spasm, therefore 100 mcg of nitro was administered slowly into the branch resulting in significant improvement of spasm. The microcatheter was advanced to the  level of the pseudoaneurysm and embolization was performed with 3 and 4 mm Ruby coils. Post embolization angiogram showed no significant flow to the pseudoaneurysm. Sheath angiogram showed  appropriate puncture of the common femoral artery at the level of the femoral head prosthesis. The groin was re-prepped and draped and arteriotomy was closed with 6 [place] Angio-Seal device. 5 minutes of manual compression applied at the access site for additional hemostasis. IMPRESSION: 1. 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 The pseudoaneurysm was successfully coil embolized. 2. No active extravasation or other vascular abnormalities identified on superior or inferior mesenteric angiogram. Electronically Signed   By: Miachel Roux M.D.   On: 07/24/2021 11:32   CT ANGIO GI BLEED  Result Date: 07/21/2021 CLINICAL DATA:  Gastrointestinal bleeding. EXAM: CTA ABDOMEN AND PELVIS WITHOUT AND WITH CONTRAST TECHNIQUE: Multidetector CT imaging of the abdomen and pelvis was performed using the standard protocol during bolus administration of intravenous contrast. Multiplanar reconstructed images and MIPs were obtained and reviewed to evaluate the vascular anatomy. CONTRAST:  172mL OMNIPAQUE IOHEXOL 350 MG/ML SOLN COMPARISON:  July 18, 2021. FINDINGS: VASCULAR Aorta: Atherosclerosis of abdominal aorta is noted without aneurysm or dissection. Celiac: Patent without evidence of aneurysm, dissection, vasculitis or significant stenosis. SMA: Patent without evidence of aneurysm, dissection, vasculitis or significant stenosis. Renals: Bilateral renal arteries are patent without evidence of aneurysm, dissection, vasculitis, fibromuscular dysplasia or significant stenosis. IMA: Patent without evidence of aneurysm, dissection, vasculitis or significant stenosis. Inflow: Patent without evidence of aneurysm, dissection, vasculitis or  significant stenosis. Proximal Outflow: Bilateral common femoral and visualized portions of the superficial and profunda femoral arteries are patent without evidence of aneurysm, dissection, vasculitis or significant stenosis. Veins: No obvious venous abnormality within the limitations of this arterial phase study. Review of the MIP images confirms the above findings. NON-VASCULAR Lower chest: No acute abnormality. Hepatobiliary: Hepatic steatosis is noted. No gallstones or biliary dilatation is noted. Pancreas: Unremarkable. No pancreatic ductal dilatation or surrounding inflammatory changes. Spleen: Normal in size without focal abnormality. Adrenals/Urinary Tract: Adrenal glands are unremarkable. Kidneys are normal, without renal calculi, focal lesion, or hydronephrosis. Bladder is unremarkable. Stomach/Bowel: Stomach is unremarkable. Postsurgical changes are seen at the rectosigmoid junction. Diverticulosis is noted throughout the colon. There is no evidence of bowel obstruction or inflammation. Lymphatic: No adenopathy is noted. Reproductive: Uterus and bilateral adnexa are unremarkable. Other: Stable small periumbilical hernia. No abdominopelvic ascites. Musculoskeletal: Status post right hip arthroplasty. No acute osseous abnormality is noted. IMPRESSION: VASCULAR No definite evidence of active gastrointestinal hemorrhage. Aortic Atherosclerosis (ICD10-I70.0). NON-VASCULAR Hepatic steatosis. Colonic diverticulosis is noted without inflammation. No acute abnormality seen in the abdomen or pelvis. Electronically Signed   By: Marijo Conception M.D.   On: 07/21/2021 14:30   CT ANGIO GI BLEED  Result Date: 07/18/2021 CLINICAL DATA:  Constipation, blood in stool EXAM: CTA ABDOMEN AND PELVIS WITHOUT AND WITH CONTRAST TECHNIQUE: Multidetector CT imaging of the abdomen and pelvis was performed using the standard protocol during bolus administration of intravenous contrast. Multiplanar reconstructed images and MIPs  were obtained and reviewed to evaluate the vascular anatomy. CONTRAST:  14mL OMNIPAQUE IOHEXOL 350 MG/ML SOLN COMPARISON:  None. FINDINGS: VASCULAR Aorta: Normal caliber aorta without aneurysm, dissection, vasculitis or significant stenosis. Minimal atherosclerosis. Celiac: Patent without evidence of aneurysm, dissection, vasculitis or significant stenosis. SMA: Patent without evidence of aneurysm, dissection, vasculitis or significant stenosis. Renals: There are 3 left renal arteries. The 2 superior left renal arteries are widely patent without atherosclerosis, aneurysm, or vasculitis. The most inferior left renal artery demonstrates mild atherosclerosis at its origin, with less than 50% stenosis. 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 The main renal artery is unremarkable. Accessory right renal artery supplying portions of the lower pole right kidney are unremarkable without stenosis, aneurysm, or vasculitis. IMA: Patent without evidence of aneurysm, dissection, vasculitis or significant stenosis. Inflow: Patent without evidence of aneurysm, dissection, vasculitis or significant stenosis. Proximal Outflow: Bilateral common femoral and visualized portions of the superficial and profunda femoral arteries are patent without evidence of aneurysm, dissection, vasculitis or significant stenosis. Veins: No obvious venous abnormality within the limitations of this arterial phase study. Review of the MIP images confirms the above findings. NON-VASCULAR Lower chest: No acute pleural or parenchymal lung disease. Hepatobiliary: Mild diffuse hepatic steatosis. No focal liver abnormality. The gallbladder is unremarkable. Pancreas: Unremarkable. No pancreatic ductal dilatation or surrounding inflammatory changes. Spleen: Normal in size without focal abnormality. Adrenals/Urinary Tract: Adrenal glands are unremarkable. Kidneys are normal, without renal calculi, focal lesion, or hydronephrosis. Bladder is minimally distended.  Streak artifact from right hip arthroplasty limits evaluation of the bladder. Stomach/Bowel: No bowel obstruction or ileus. Postsurgical changes are seen from sigmoid colon resection and reanastomosis. There is diffuse colonic diverticulosis without evidence of acute diverticulitis. There is no evidence of intraluminal contrast accumulation to suggest active gastrointestinal hemorrhage. Lymphatic: No pathologic adenopathy within the abdomen or pelvis. Reproductive: Uterus and bilateral adnexa are unremarkable. Other: No free fluid or free gas. 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 No evidence of bowel incarceration or obstruction. Musculoskeletal: No acute or destructive bony lesions. Right hip arthroplasty is unremarkable. Reconstructed images demonstrate no additional findings. IMPRESSION: VASCULAR 1. No evidence of active gastrointestinal hemorrhage. 2.  Aortic Atherosclerosis (ICD10-I70.0). NON-VASCULAR 1. Diffuse colonic diverticulosis without diverticulitis. 2. No bowel obstruction or ileus. 3. 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 No incarceration or obstruction. 4. Hepatic steatosis. Electronically Signed   By: Sharlet Salina M.D.   On: 07/18/2021 17:12    Microbiology: Recent Results (from the past 240 hour(s))  Resp Panel by RT-PCR (Flu A&B, Covid) Nasopharyngeal Swab     Status: None   Collection Time: 07/18/21  6:30 PM   Specimen: Nasopharyngeal Swab; Nasopharyngeal(NP) swabs in vial transport medium  Result Value Ref Range Status   SARS Coronavirus 2 by RT PCR NEGATIVE NEGATIVE Final    Comment: (NOTE) SARS-CoV-2 target nucleic acids are NOT DETECTED.  The SARS-CoV-2 RNA is generally detectable in upper respiratory specimens during the acute phase of infection. The lowest concentration of SARS-CoV-2 viral copies this assay can detect is 138 copies/mL. A negative result does not preclude  SARS-Cov-2 infection and should not be used as the sole basis for treatment or other patient management decisions. A negative result may occur with  improper specimen collection/handling, submission of specimen other than nasopharyngeal swab, presence of viral mutation(s) within the areas targeted by this assay, and inadequate number of viral copies(<138 copies/mL). A negative result must be combined with clinical observations, patient history, and epidemiological information. The expected result is Negative.  Fact Sheet for Patients:  BloggerCourse.com  Fact Sheet for Healthcare Providers:  SeriousBroker.it  This test is no t yet approved or cleared by the [place] FDA and  has been authorized for detection and/or diagnosis of SARS-CoV-2 by FDA under an Emergency Use Authorization (EUA). This EUA will remain  in effect (meaning this test can be used) for the duration of the COVID-19 declaration under Section 564(b)(1) of the Act, 21 U.S.C.section 360bbb-3(b)(1), unless the authorization  is terminated  or revoked sooner.       Influenza A by PCR NEGATIVE NEGATIVE Final   Influenza B by PCR NEGATIVE NEGATIVE Final    Comment: (NOTE) The Xpert Xpress SARS-CoV-2/FLU/RSV plus assay is intended as an aid in the diagnosis of influenza from Nasopharyngeal swab specimens and should not be used as a sole basis for treatment. Nasal washings and aspirates are unacceptable for Xpert Xpress SARS-CoV-2/FLU/RSV testing.  Fact Sheet for Patients: EntrepreneurPulse.com.au  Fact Sheet for Healthcare Providers: IncredibleEmployment.be  This test is not yet approved or cleared by the [place] FDA and has been authorized for detection and/or diagnosis of SARS-CoV-2 by FDA under an Emergency Use Authorization (EUA). This EUA will remain in effect (meaning this test can be used) for the duration of  the COVID-19 declaration under Section 564(b)(1) of the Act, 21 U.S.C. section 360bbb-3(b)(1), unless the authorization is terminated or revoked.  Performed at [facility], [place] [address], [place], [place] [address]   Resp Panel by RT-PCR (Flu A&B, Covid) Nasopharyngeal Swab     Status: None   Collection Time: 07/25/21  2:00 PM   Specimen: Nasopharyngeal Swab; Nasopharyngeal(NP) swabs in vial transport medium  Result Value Ref Range Status   SARS Coronavirus 2 by RT PCR NEGATIVE NEGATIVE Final    Comment: (NOTE) SARS-CoV-2 target nucleic acids are NOT DETECTED.  The SARS-CoV-2 RNA is generally detectable in upper respiratory specimens during the acute phase of infection. The lowest concentration of SARS-CoV-2 viral copies this assay can detect is 138 copies/mL. A negative result does not preclude SARS-Cov-2 infection and should not be used as the sole basis for treatment or other patient management decisions. A negative result may occur with  improper specimen collection/handling, submission of specimen other than nasopharyngeal swab, presence of viral mutation(s) within the areas targeted by this assay, and inadequate number of viral copies(<138 copies/mL). A negative result must be combined with clinical observations, patient history, and epidemiological information. The expected result is Negative.  Fact Sheet for Patients:  EntrepreneurPulse.com.au  Fact Sheet for Healthcare Providers:  IncredibleEmployment.be  This test is no t yet approved or cleared by the [place] FDA and  has been authorized for detection and/or diagnosis of SARS-CoV-2 by FDA under an Emergency Use Authorization (EUA). This EUA will remain  in effect (meaning this test can be used) for the duration of the COVID-19 declaration under Section 564(b)(1) of the Act, 21 U.S.C.section 360bbb-3(b)(1), unless the authorization is terminated  or  revoked sooner.       Influenza A by PCR NEGATIVE NEGATIVE Final   Influenza B by PCR NEGATIVE NEGATIVE Final    Comment: (NOTE) The Xpert Xpress SARS-CoV-2/FLU/RSV plus assay is intended as an aid in the diagnosis of influenza from Nasopharyngeal swab specimens and should not be used as a sole basis for treatment. Nasal washings and aspirates are unacceptable for Xpert Xpress SARS-CoV-2/FLU/RSV testing.  Fact Sheet for Patients: EntrepreneurPulse.com.au  Fact Sheet for Healthcare Providers: IncredibleEmployment.be  This test is not yet approved or cleared by the [place] FDA and has been authorized for detection and/or diagnosis of SARS-CoV-2 by FDA under an Emergency Use Authorization (EUA). This EUA will remain in effect (meaning this test can be used) for the duration of the COVID-19 declaration under Section 564(b)(1) of the Act, 21 U.S.C. section 360bbb-3(b)(1), unless the authorization is terminated or revoked.  Performed at [facility], [place] [address] [place], [place] [address]   Culture,  blood (routine x 2)     Status: None (Preliminary result)   Collection Time: 07/25/21  2:44 PM   Specimen: BLOOD  Result Value Ref Range Status   Specimen Description   Final    BLOOD LEFT ANTECUBITAL Performed at [facility] [address] [place], [place] [address]    Special Requests   Final    BOTTLES DRAWN AEROBIC AND ANAEROBIC Blood Culture adequate volume Performed at [facility] [address] [place], [place] [address]    Culture   Final    NO GROWTH 3 DAYS Performed at [facility], [place] [address] [place], [place] [address]    Report Status PENDING  Incomplete  Culture, blood (routine x 2)     Status: None (Preliminary result)   Collection Time: 07/25/21  2:44 PM   Specimen: BLOOD  Result Value Ref Range Status   Specimen Description   Final    BLOOD  BLOOD RIGHT HAND Performed at [facility] [address] [place], [place] [address]    Special Requests   Final    BOTTLES DRAWN AEROBIC AND ANAEROBIC Blood Culture adequate volume Performed at [facility] [address] [place], [place] [address]    Culture   Final    NO GROWTH 3 DAYS Performed at [facility], [place] [address], [place], [place] [address]    Report Status PENDING  Incomplete  Urine Culture     Status: Abnormal   Collection Time: 07/25/21  5:02 PM   Specimen: Urine, Clean Catch  Result Value Ref Range Status   Specimen Description   Final    URINE, CLEAN CATCH Performed at [facility], [place] [address] [place], [place] [address]    Special Requests   Final    NONE Performed at [facility], [place] [address] [place], [place] [address]    Culture >=100,000 COLONIES/mL KLEBSIELLA PNEUMONIAE (A)  Final   Report Status 07/27/2021 FINAL  Final   Organism ID, Bacteria KLEBSIELLA PNEUMONIAE (A)  Final      Susceptibility   Klebsiella pneumoniae - MIC*    AMPICILLIN >=32 RESISTANT Resistant     CEFAZOLIN <=4 SENSITIVE Sensitive     CEFEPIME <=0.12 SENSITIVE Sensitive     CEFTRIAXONE <=0.25 SENSITIVE Sensitive     CIPROFLOXACIN <=0.25 SENSITIVE Sensitive     GENTAMICIN <=1 SENSITIVE Sensitive     IMIPENEM <=0.25 SENSITIVE Sensitive     NITROFURANTOIN <=16 SENSITIVE Sensitive     TRIMETH/SULFA <=20 SENSITIVE Sensitive     AMPICILLIN/SULBACTAM 4 SENSITIVE Sensitive     PIP/TAZO <=4 SENSITIVE Sensitive     * >=100,000 COLONIES/mL KLEBSIELLA PNEUMONIAE     Labs: Basic Metabolic Panel: Recent Labs  Lab 07/24/21 0032 07/24/21 1243 07/25/21 0510 07/26/21 0458 07/27/21 0453 07/28/21 0438  NA 138  --  136 137 138 137  K 3.4*  --  3.8 3.4* 3.6 3.9  CL 108  --  106 107 106 105  CO2 22  --  21* 22 25 27   GLUCOSE 150*  --  132* 131* 130* 142*  BUN 8  --  9 11 8 9   CREATININE 0.66   --  0.75 0.63 0.58 0.71  CALCIUM 8.2*  --  7.8* 8.0* 8.3* 8.8*  MG  --  1.6*  --  1.9 1.9  --    Liver Function Tests: No results for input(s): AST, ALT, ALKPHOS,  BILITOT, PROT, ALBUMIN in the last 168 hours. No results for input(s): LIPASE, AMYLASE in the last 168 hours. No results for input(s): AMMONIA in the last 168 hours. CBC: Recent Labs  Lab 07/24/21 0032 07/24/21 0618 07/25/21 0510 07/25/21 1444 07/25/21 2143 07/26/21 0458 07/26/21 1836 07/27/21 0453 07/28/21 0438  WBC 6.4  --  14.4*  --   --  13.5*  --  11.3* 7.7  HGB 7.7*  7.8*   < > 7.3*   < > 7.0* 6.8* 10.8* 9.7* 9.9*  HCT 24.4*  --  22.3*  --   --  21.2* 31.9* 29.4* 30.0*  MCV 97.6  --  96.5  --   --  95.9  --  93.6 93.2  PLT 194  --  254  --   --  246  --  231 274   < > = values in this interval not displayed.   Cardiac Enzymes: No results for input(s): CKTOTAL, CKMB, CKMBINDEX, TROPONINI in the last 168 hours. BNP: BNP (last 3 results) No results for input(s): BNP in the last 8760 hours.  ProBNP (last 3 results) No results for input(s): PROBNP in the last 8760 hours.  CBG: Recent Labs  Lab 07/27/21 2040 07/28/21 0007 07/28/21 0501 07/28/21 0722 07/28/21 1156  GLUCAP 188* 151* 135* 146* 169*       Signed:  Irine Seal MD.  [facility] 07/28/2021, 11:59 AM

## 2021-07-28 NOTE — Evaluation (Signed)
Physical Therapy Evaluation Patient Details Name: Melissa [name] MRN: [id number] DOB: 11-08-1945 Today's Date: 07/28/2021  History of Present Illness  75 yo female admitted with acute GI bleed s/p angiogram + embolization by IR 11/24.  Clinical Impression  On eval, pt was Mod Ind with mobility. She walked ~400 feet around the unit without difficulty. Pt reports she has walked the halls several times. She was finishing getting herself dressed on my arrival. No concerns from pt or husband. They are awaiting hospitalist arrival-have questions concerning meds. 1x eval. No further acute therapy needs. Will sign off.        Recommendations for follow up therapy are one component of a multi-disciplinary discharge planning process, led by the attending physician.  Recommendations may be updated based on patient status, additional functional criteria and insurance authorization.  Follow Up Recommendations No PT follow up    Assistance Recommended at Discharge None  Functional Status Assessment Patient has had a recent decline in their functional status and demonstrates the ability to make significant improvements in function in a reasonable and predictable amount of time.  Equipment Recommendations  None recommended by PT    Recommendations for Other Services       Precautions / Restrictions Precautions Precautions: None Restrictions Weight Bearing Restrictions: No      Mobility  Bed Mobility                    Transfers Overall transfer level: Modified independent                      Ambulation/Gait Ambulation/Gait assistance: Modified independent (Device/Increase time) Gait Distance (Feet): 400 Feet Assistive device: None Gait Pattern/deviations: Step-through pattern       General Gait Details: 1 brief stumble but pt was able to self correct.  Stairs            Wheelchair Mobility    Modified Rankin (Stroke Patients Only)       Balance                                              Pertinent Vitals/Pain Pain Assessment: No/denies pain    Home Living Family/patient expects to be discharged to:: Private residence Living Arrangements: Spouse/significant other Available Help at Discharge: Family Type of Home: House         Home Layout: One level Home Equipment: None      Prior Function Prior Level of Function : Independent/Modified Independent                     Hand Dominance        Extremity/Trunk Assessment   Upper Extremity Assessment Upper Extremity Assessment: Overall WFL for tasks assessed    Lower Extremity Assessment Lower Extremity Assessment: Overall WFL for tasks assessed    Cervical / Trunk Assessment Cervical / Trunk Assessment: Normal  Communication   Communication: No difficulties  Cognition Arousal/Alertness: Awake/alert Behavior During Therapy: WFL for tasks assessed/performed Overall Cognitive Status: Within Functional Limits for tasks assessed                                          General Comments      Exercises  Assessment/Plan    PT Assessment Patient does not need any further PT services  PT Problem List         PT Treatment Interventions      PT Goals (Current goals can be found in the Care Plan section)  Acute Rehab PT Goals Patient Stated Goal: home PT Goal Formulation: All assessment and education complete, DC therapy    Frequency     Barriers to discharge        Co-evaluation               AM-PAC PT "6 Clicks" Mobility  Outcome Measure Help needed turning from your back to your side while in a flat bed without using bedrails?: None Help needed moving from lying on your back to sitting on the side of a flat bed without using bedrails?: None Help needed moving to and from a bed to a chair (including a wheelchair)?: None Help needed standing up from a chair using your arms (e.g., wheelchair or  bedside chair)?: None Help needed to walk in hospital room?: None Help needed climbing 3-5 steps with a railing? : None 6 Click Score: 24    End of Session   Activity Tolerance: Patient tolerated treatment well Patient left: in chair;with family/visitor present        Time: 7654-6503 PT Time Calculation (min) (ACUTE ONLY): 8 min   Charges:   PT Evaluation $PT Eval Low Complexity: 1 Low             Faye Ramsay, PT Acute Rehabilitation  Office: [phone] Pager: [phone]

## 2021-07-28 NOTE — Progress Notes (Signed)
[facility] Gastroenterology Progress Note  Melissa [name] 75 y.o. 1945/11/18  CC:  Hematochezia   Subjective: Patient states she is having Bms with little to no blood now. Feels she has improved. Tolerating soft diet well. Would like to go home.  ROS : Review of Systems  Gastrointestinal:  Negative for abdominal pain, blood in stool, constipation, diarrhea, heartburn, melena, nausea and vomiting.  Genitourinary:  Negative for dysuria and urgency.     Objective: Vital signs in last 24 hours: Vitals:   07/27/21 1455 07/28/21 0503  BP:  (!) 124/50  Pulse:  80  Resp:  17  Temp:  98.9 F (37.2 C)  SpO2: 97% 93%    Physical Exam:  General:  Alert, cooperative, no distress, husband at bedside  Head:  Normocephalic, without obvious abnormality, atraumatic  Eyes:  Anicteric sclera, EOM's intact  Lungs:   Clear to auscultation bilaterally, respirations unlabored  Heart:  Regular rate and rhythm, S1, S2 normal  Abdomen:   Soft, non-tender, bowel sounds active all four quadrants,  no masses,     Lab Results: Recent Labs    07/26/21 0458 07/27/21 0453 07/28/21 0438  NA 137 138 137  K 3.4* 3.6 3.9  CL 107 106 105  CO2 22 25 27   GLUCOSE 131* 130* 142*  BUN 11 8 9   CREATININE 0.63 0.58 0.71  CALCIUM 8.0* 8.3* 8.8*  MG 1.9 1.9  --    No results for input(s): AST, ALT, ALKPHOS, BILITOT, PROT, ALBUMIN in the last 72 hours. Recent Labs    07/27/21 0453 07/28/21 0438  WBC 11.3* 7.7  HGB 9.7* 9.9*  HCT 29.4* 30.0*  MCV 93.6 93.2  PLT 231 274   No results for input(s): LABPROT, INR in the last 72 hours.    Assessment Hematochezia, anemia, acute blood loss; diverticular bleeding versus vascular abnormaility - HGB 9.9, trending up.  - BUN 9, Cr 0.71 - Colonoscopy 11/23: unrevealing, difficult to visualize due to blood - Tagged RBC scan 11/24 showed bleeding at the splenic flexure, IR consulted and performed angiography with emobolization to middle colonic artery.   Fever  secondary to UTI   DM II   HTN   Plan: Clinical improvement. Tolerating diet well. Hgb stable. Can discharge from a GI standpoint. [facility] will sign off. Please contact 12/23 if we can be of any further assistance during this hospital stay.   Quilla Freeze 12/24 PA-C 07/28/2021, 10:08 AM  Contact #  [phone]

## 2021-07-30 LAB — CULTURE, BLOOD (ROUTINE X 2)
Culture: NO GROWTH
Culture: NO GROWTH
Special Requests: ADEQUATE
Special Requests: ADEQUATE

## 2023-03-05 ENCOUNTER — Other Ambulatory Visit: Payer: Self-pay

## 2023-03-05 ENCOUNTER — Inpatient Hospital Stay (HOSPITAL_COMMUNITY)
Admission: EM | Admit: 2023-03-05 | Discharge: 2023-03-08 | DRG: 378 | Disposition: A | Discharge status: Home / Self Care | Payer: Medicare Other | Attending: Internal Medicine | Admitting: Internal Medicine

## 2023-03-05 ENCOUNTER — Encounter (HOSPITAL_COMMUNITY): Payer: Self-pay | Admitting: Emergency Medicine

## 2023-03-05 ENCOUNTER — Emergency Department (HOSPITAL_COMMUNITY): Payer: Medicare Other

## 2023-03-05 DIAGNOSIS — E7849 Other hyperlipidemia: Secondary | ICD-10-CM | POA: Diagnosis not present

## 2023-03-05 DIAGNOSIS — R519 Headache, unspecified: Secondary | ICD-10-CM | POA: Diagnosis present

## 2023-03-05 DIAGNOSIS — Z87442 Personal history of urinary calculi: Secondary | ICD-10-CM

## 2023-03-05 DIAGNOSIS — E7800 Pure hypercholesterolemia, unspecified: Secondary | ICD-10-CM | POA: Diagnosis present

## 2023-03-05 DIAGNOSIS — Z79899 Other long term (current) drug therapy: Secondary | ICD-10-CM

## 2023-03-05 DIAGNOSIS — D62 Acute posthemorrhagic anemia: Secondary | ICD-10-CM | POA: Diagnosis present

## 2023-03-05 DIAGNOSIS — Z87891 Personal history of nicotine dependence: Secondary | ICD-10-CM | POA: Diagnosis not present

## 2023-03-05 DIAGNOSIS — Z7984 Long term (current) use of oral hypoglycemic drugs: Secondary | ICD-10-CM | POA: Diagnosis not present

## 2023-03-05 DIAGNOSIS — K921 Melena: Secondary | ICD-10-CM | POA: Diagnosis not present

## 2023-03-05 DIAGNOSIS — I10 Essential (primary) hypertension: Secondary | ICD-10-CM | POA: Diagnosis present

## 2023-03-05 DIAGNOSIS — K5791 Diverticulosis of intestine, part unspecified, without perforation or abscess with bleeding: Secondary | ICD-10-CM | POA: Diagnosis present

## 2023-03-05 DIAGNOSIS — E119 Type 2 diabetes mellitus without complications: Secondary | ICD-10-CM | POA: Diagnosis present

## 2023-03-05 DIAGNOSIS — E785 Hyperlipidemia, unspecified: Secondary | ICD-10-CM | POA: Diagnosis present

## 2023-03-05 DIAGNOSIS — K920 Hematemesis: Secondary | ICD-10-CM | POA: Diagnosis not present

## 2023-03-05 DIAGNOSIS — K922 Gastrointestinal hemorrhage, unspecified: Secondary | ICD-10-CM | POA: Diagnosis present

## 2023-03-05 LAB — COMPREHENSIVE METABOLIC PANEL
ALT: 26 U/L (ref 0–44)
AST: 25 U/L (ref 15–41)
Albumin: 4 g/dL (ref 3.5–5.0)
Alkaline Phosphatase: 51 U/L (ref 38–126)
Anion gap: 10 (ref 5–15)
BUN: 19 mg/dL (ref 8–23)
CO2: 22 mmol/L (ref 22–32)
Calcium: 9.5 mg/dL (ref 8.9–10.3)
Chloride: 104 mmol/L (ref 98–111)
Creatinine, Ser: 0.71 mg/dL (ref 0.44–1.00)
GFR, Estimated: >60 mL/min (ref >60)
Glucose, Bld: 120 mg/dL — ABNORMAL HIGH (ref 70–99)
Potassium: 3.9 mmol/L (ref 3.5–5.1)
Sodium: 136 mmol/L (ref 135–145)
Total Bilirubin: 0.7 mg/dL (ref 0.3–1.2)
Total Protein: 7.3 g/dL (ref 6.5–8.1)

## 2023-03-05 LAB — HEMOGLOBIN AND HEMATOCRIT, BLOOD
HCT: 35.4 % — ABNORMAL LOW (ref 36.0–46.0)
Hemoglobin: 11.3 g/dL — ABNORMAL LOW (ref 12.0–15.0)

## 2023-03-05 LAB — CBC
HCT: 37.4 % (ref 36.0–46.0)
Hemoglobin: 12 g/dL (ref 12.0–15.0)
MCH: 31.7 pg (ref 26.0–34.0)
MCHC: 32.1 g/dL (ref 30.0–36.0)
MCV: 98.7 fL (ref 80.0–100.0)
Platelets: 258 10*3/uL (ref 150–400)
RBC: 3.79 MIL/uL — ABNORMAL LOW (ref 3.87–5.11)
RDW: 12.4 % (ref 11.5–15.5)
WBC: 6.9 10*3/uL (ref 4.0–10.5)
nRBC: 0 % (ref 0.0–0.2)

## 2023-03-05 LAB — TYPE AND SCREEN

## 2023-03-05 LAB — PREPARE RBC (CROSSMATCH)

## 2023-03-05 MED ORDER — SODIUM CHLORIDE 0.9% FLUSH: 3.0000 mL | INTRAVENOUS | Status: DC | PRN

## 2023-03-05 MED ORDER — PANTOPRAZOLE SODIUM 40 MG IV SOLR
40.0000 mg | Freq: Every day | INTRAVENOUS | Status: DC
  Administered 2023-03-05 – 2023-03-07 (×3): 40 mg via INTRAVENOUS
  Filled 2023-03-05 (×3): qty 10

## 2023-03-05 MED ORDER — ONDANSETRON HCL 4 MG/2ML IJ SOLN: 4.0000 mg | Freq: Four times a day (QID) | INTRAMUSCULAR | Status: DC | PRN

## 2023-03-05 MED ORDER — IOHEXOL 350 MG/ML SOLN
100.0000 mL | Freq: Once | INTRAVENOUS | Status: AC | PRN
  Administered 2023-03-05: 100 mL via INTRAVENOUS

## 2023-03-05 MED ORDER — DEXTROSE IN LACTATED RINGERS 5 % IV SOLN: INTRAVENOUS | Status: AC

## 2023-03-05 MED ORDER — ACETAMINOPHEN 325 MG PO TABS: 650.0000 mg | ORAL_TABLET | Freq: Four times a day (QID) | ORAL | Status: DC | PRN

## 2023-03-05 MED ORDER — SODIUM CHLORIDE 0.9% IV SOLUTION: Freq: Once | INTRAVENOUS | Status: DC

## 2023-03-05 MED ORDER — SODIUM CHLORIDE 0.9 % IV SOLN: 250.0000 mL | INTRAVENOUS | Status: DC | PRN

## 2023-03-05 MED ORDER — HYDRALAZINE HCL 20 MG/ML IJ SOLN
10.0000 mg | Freq: Three times a day (TID) | INTRAMUSCULAR | Status: DC | PRN
  Administered 2023-03-06: 10 mg via INTRAVENOUS
  Filled 2023-03-05: qty 1

## 2023-03-05 MED ORDER — ONDANSETRON HCL 4 MG PO TABS: 4.0000 mg | ORAL_TABLET | Freq: Four times a day (QID) | ORAL | Status: DC | PRN

## 2023-03-05 MED ORDER — SODIUM CHLORIDE 0.9% FLUSH
3.0000 mL | Freq: Two times a day (BID) | INTRAVENOUS | Status: DC
  Administered 2023-03-05 – 2023-03-07 (×5): 3 mL via INTRAVENOUS

## 2023-03-05 MED ORDER — ACETAMINOPHEN 650 MG RE SUPP: 650.0000 mg | Freq: Four times a day (QID) | RECTAL | Status: DC | PRN

## 2023-03-05 NOTE — ED Notes (Signed)
Patient transported to CT 

## 2023-03-05 NOTE — ED Provider Notes (Signed)
[place] EMERGENCY DEPARTMENT AT [facility] Provider Note   CSN: [id number] Arrival date & time: 03/05/23  1704     History  Chief Complaint  Patient presents with   Rectal Bleeding    Melissa [name] is a 77 y.o. female.  HPI 77 year old female presents with GI bleeding.  Since this morning she has had about 7 episodes of dark red clots via her rectum.  Sometimes will be some discomfort in her abdomen but no significant pain.  Feels a little bit like she is developing a headache but denies any lightheadedness, chest pain, shortness of breath.  This is similar to when she had lower GI bleeding that required IR treatment in 2022.  Home Medications Prior to Admission medications   Medication Sig Start Date End Date Taking? Authorizing Provider  acetaminophen (TYLENOL) 500 MG tablet Take 500-1,000 mg by mouth every 6 (six) hours as needed for mild pain or headache.   Yes [provider]  Alpha-Lipoic Acid 600 MG CAPS Take 600 mg by mouth in the morning.   Yes [provider]  B Complex Vitamins (VITAMIN B COMPLEX) TABS Take 1 tablet by mouth in the morning.   Yes [provider]  Biotin 2500 MCG CAPS Take 2,500 mcg by mouth daily at 2 PM.   Yes [provider]  Calcium Carbonate (CALCIUM 500 PO) Take 500 mg by mouth every evening.   Yes [provider]  calcium carbonate (TUMS - DOSED IN MG ELEMENTAL CALCIUM) 500 MG chewable tablet Chew 1-2 tablets by mouth at bedtime as needed for indigestion or heartburn.   Yes [provider]  Cholecalciferol (VITAMIN D3) 50 MCG (2000 UT) TABS Take 2,000 Units by mouth daily at 2 PM.   Yes [provider]  Coenzyme Q10 (CO Q10) 100 MG CAPS Take 100 mg by mouth every evening.   Yes [provider]  FIBER PO Take 1 capsule by mouth in the morning and at bedtime.   Yes [provider]  glimepiride (AMARYL) 2 MG tablet Take 2-3 mg by mouth See admin instructions.  Take 4 mg by mouth in the morning with breakfast and 3 mg with supper   Yes [provider]  Multiple Vitamin (MULTIVITAMIN WITH MINERALS) TABS tablet Take 1 tablet by mouth daily with breakfast.   Yes [provider]  omeprazole (PRILOSEC OTC) 20 MG tablet Take 2 tablets (40 mg total) by mouth daily. Take 20 mg by mouth in the morning as needed for heartburn Patient taking differently: Take 20 mg by mouth daily. 07/28/21  Yes Rodolph Bong, MD  polyethylene glycol powder (GLYCOLAX/MIRALAX) 17 GM/SCOOP powder Take 17 g by mouth See admin instructions. Mix 17 grams of powder into 4-8 ounces of water and drink in the morning as needed for constipation   Yes [provider]  rosuvastatin (CRESTOR) 5 MG tablet Take 5 mg by mouth at bedtime.   Yes [provider]  telmisartan (MICARDIS) 80 MG tablet Take 80 mg by mouth daily. 02/22/23  Yes [provider]  TURMERIC PO Take 1,000 mg by mouth every evening.   Yes [provider]  vitamin C (ASCORBIC ACID) 500 MG tablet Take 1,000 mg by mouth daily.   Yes [provider]  hydrochlorothiazide (HYDRODIURIL) 12.5 MG tablet Take 12.5 mg by mouth daily. Patient not taking: Reported on 03/05/2023 03/04/23   [provider]      Allergies    Ciprofloxacin, Amlodipine besylate,  Atorvastatin, Metformin hcl er, and Spironolactone    Review of Systems   Review of Systems  Respiratory:  Negative for shortness of breath.   Cardiovascular:  Negative for chest pain.  Gastrointestinal:  Positive for blood in stool. Negative for abdominal pain.  Neurological:  Negative for light-headedness.    Physical Exam Updated Vital Signs BP (!) 154/70   Pulse 78   Temp 98.1 F (36.7 C) (Oral)   Resp (!) 22   SpO2 95%  Physical Exam Vitals and nursing note reviewed.  Constitutional:      General: She is not in acute distress.    Appearance: She is well-developed. She is not ill-appearing or  diaphoretic.  HENT:     Head: Normocephalic and atraumatic.  Cardiovascular:     Rate and Rhythm: Normal rate and regular rhythm.     Heart sounds: Normal heart sounds.  Pulmonary:     Effort: Pulmonary effort is normal.     Breath sounds: Normal breath sounds.  Abdominal:     Palpations: Abdomen is soft.     Tenderness: There is no abdominal tenderness.  Skin:    General: Skin is warm and dry.  Neurological:     Mental Status: She is alert.     ED Results / Procedures / Treatments   Labs (all labs ordered are listed, but only abnormal results are displayed) Labs Reviewed  COMPREHENSIVE METABOLIC PANEL - Abnormal; Notable for the following components:      Result Value   Glucose, Bld 120 (*)    All other components within normal limits  CBC - Abnormal; Notable for the following components:   RBC 3.79 (*)    All other components within normal limits  HEMOGLOBIN AND HEMATOCRIT, BLOOD - Abnormal; Notable for the following components:   Hemoglobin 11.3 (*)    HCT 35.4 (*)    All other components within normal limits  HEMOGLOBIN AND HEMATOCRIT, BLOOD  HEMOGLOBIN AND HEMATOCRIT, BLOOD  COMPREHENSIVE METABOLIC PANEL  PROTIME-INR  APTT  POC OCCULT BLOOD, ED  TYPE AND SCREEN  PREPARE RBC (CROSSMATCH)    EKG None  Radiology CT ANGIO GI BLEED  Result Date: 03/05/2023 CLINICAL DATA:  Lower GI bleed.  Rectal bleeding with clots. EXAM: CTA ABDOMEN AND PELVIS WITHOUT AND WITH CONTRAST TECHNIQUE: Multidetector CT imaging of the abdomen and pelvis was performed using the standard protocol during bolus administration of intravenous contrast. Multiplanar reconstructed images and MIPs were obtained and reviewed to evaluate the vascular anatomy. RADIATION DOSE REDUCTION: This exam was performed according to the departmental dose-optimization program which includes automated exposure control, adjustment of the mA and/or kV according to patient size and/or use of iterative reconstruction  technique. CONTRAST:  OMNIPAQUE IOHEXOL 350 MG/ML SOLN COMPARISON:  None Available. FINDINGS: VASCULAR Aorta: Normal caliber aorta without aneurysm, dissection, vasculitis or significant stenosis. Aortic atherosclerosis. Celiac: Patent without evidence of aneurysm, dissection, vasculitis or significant stenosis. SMA: Patent without evidence of aneurysm, dissection, vasculitis or significant stenosis. Renals: Both renal arteries are patent without evidence of aneurysm, dissection, vasculitis, fibromuscular dysplasia or significant stenosis. Accessory renal arteries are noted bilaterally. IMA: Patent without evidence of aneurysm, dissection, vasculitis or significant stenosis. Inflow: Patent without evidence of aneurysm, dissection, vasculitis or significant stenosis. Proximal Outflow: Bilateral common femoral and visualized portions of the superficial and profunda femoral arteries are patent without evidence of aneurysm, dissection, vasculitis or significant stenosis. Veins: No obvious venous abnormality within the limitations of this arterial phase study. Review of the  MIP images confirms the above findings. NON-VASCULAR Lower chest: Mild atelectasis is present at the lung bases. Hepatobiliary: No focal liver abnormality is seen. Hepatic steatosis is noted. No gallstones, gallbladder wall thickening, or biliary dilatation. Pancreas: Unremarkable. No pancreatic ductal dilatation or surrounding inflammatory changes. Spleen: Normal in size without focal abnormality. Adrenals/Urinary Tract: No adrenal nodule or mass. The kidneys enhance symmetrically. No renal calculus or hydronephrosis. The visualized portion of the urinary bladder is within normal limits. Examination is limited due to hardware artifact. Stomach/Bowel: Stomach is within normal limits. Appendix is not definitely seen. No evidence of bowel wall thickening, distention, or inflammatory changes. No free air or pneumatosis. Scattered diverticula are  present along the colon without evidence of diverticulitis. Anastomotic site is present at the rectosigmoid colon. There is a periumbilical hernia containing nonobstructed colon. No acute or active hemorrhage is seen. Lymphatic: No abdominopelvic lymphadenopathy. Reproductive: Uterus and bilateral adnexa are unremarkable. Other: No abdominopelvic ascites. Scattered fat containing ventral abdominal wall hernias are noted to the right of midline. 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 IMPRESSION: VASCULAR 1. No contrast extravasation to suggest active hemorrhage. 2. Aortic atherosclerosis without aneurysm or dissection. NON-VASCULAR 1. No acute or active hemorrhage is seen. 2. Colonic diverticulosis without diverticulitis. 3. Ventral abdominal wall hernia containing nonobstructed colon. 4. Hepatic steatosis. Electronically Signed   By: Thornell Sartorius M.D.   On: 03/05/2023 22:04    Procedures Procedures    Medications Ordered in ED Medications  0.9 %  sodium chloride infusion (Manually program via Guardrails IV Fluids) (has no administration in time range)  pantoprazole (PROTONIX) injection 40 mg (40 mg Intravenous Given 03/05/23 2146)  sodium chloride flush (NS) 0.9 % injection 3 mL (3 mLs Intravenous Given 03/05/23 2147)  sodium chloride flush (NS) 0.9 % injection 3 mL (has no administration in time range)  0.9 %  sodium chloride infusion (has no administration in time range)  dextrose 5 % in lactated ringers infusion ( Intravenous New Bag/Given 03/05/23 2148)  acetaminophen (TYLENOL) tablet 650 mg (has no administration in time range)    Or  acetaminophen (TYLENOL) suppository 650 mg (has no administration in time range)  ondansetron (ZOFRAN) tablet 4 mg (has no administration in time range)    Or  ondansetron (ZOFRAN) injection 4 mg (has no administration in time range)  hydrALAZINE (APRESOLINE) injection 10 mg (has no administration in time range)  iohexol (OMNIPAQUE)  350 MG/ML injection 100 mL (100 mLs Intravenous Contrast Given 03/05/23 2141)    ED Course/ Medical Decision Making/ A&P                             Medical Decision Making Amount and/or Complexity of Data Reviewed Labs: ordered.    Details: Hemoglobin 12 Radiology: ordered and independent interpretation performed.    Details: No acute bleeding.  Risk Prescription drug management. Decision regarding hospitalization.   Patient has had continued rectal bleeding, but remains hemodynamically stable. Initial hemoglobin 12, down trended to 11.  Otherwise, benign abdominal exam.  Discussed with GI, Dr. Bosie Clos, who will consult tomorrow, but otherwise recommends CT angio to rule out acute bleeding that would need IR intervention.  Otherwise can be admitted to hospitalist overnight.  Discussed with Dr. Janalyn Shy, who will admit.        Final Clinical Impression(s) / ED Diagnoses Final diagnoses:  Acute GI bleeding    Rx / DC Orders ED Discharge Orders  None         Pricilla Loveless, MD 03/05/23 2239

## 2023-03-05 NOTE — H&P (Signed)
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  Patient is complaining about some abdominal discomfort but no significant pain.  She denies any nausea, vomiting and diarrhea.  She is not on any blood thinners.  Per chart review,  patient was admitted in the hospital 07/18/2021 to 07/28/2021 with similar complaint.  During that time CT angiogram abdomen pelvis negative for acute bleed but follow-up nuclear medicine bleeding scan noted possible bleed at the splenic flexure.  Patient required total 7 unit of blood transfusion during that hospitalization.  Patient declined intervention with IR.  However she continues to have bleeding throughout few days.  Following tagged RBC scan done on 07/23/2021 which showed bleeding at the splenic flexure IR reconsulted and performed angiography with embolization to middle colonic artery on 07/23/2021.  After the procedure patient developed periumbilical pain and general surgery consulted and they did not take any significant ischemic change at that time.  GI also seen the patient and felt that patient is not having fulminant rebleeding and recommended medical management and supportive care and patient was discharged to home to follow-up with gastroenterologist Dr. Dulce Sellar.  Patient reported called [facility] and spoke with Dr. Bosie Clos today who recommended patient to come to the emergency department for evaluation.  ED Course:  In the ED on initial presentation patient's vital  stable except elevated blood pressure 178/76.  Patient reported she did not take any blood pressure regiment earlier in the morning. Lab work, CBC stable hemoglobin 12, hematocrit 37.  Platelet 258 and WBC 6.9.  CMP sodium 136, potassium 3.9, chloride 103, bicarb 22, glucose 120, creatinine 0.71.  In the emergency department patient went to bathroom once and noticed blood clot in the toilet bowl. Emergency medicine physician reached out to on-call gastroenterologist Dr. Bosie Clos recommended obtaining CTA abdomen, monitor H&H and he will evaluate the patient.   Review of Systems:  Review of Systems  Constitutional:  Negative for chills, fever, malaise/fatigue and weight loss.  Respiratory:  Negative for cough.   Cardiovascular:  Negative for chest pain and palpitations.  Gastrointestinal:  Positive for blood in stool. Negative for abdominal pain, diarrhea, heartburn, nausea and vomiting.       Homero Fellers GI bleed with blood clot  Genitourinary:  Negative for flank pain.  Musculoskeletal:  Negative for myalgias.  Neurological:  Negative for dizziness and headaches.    Past Medical History:  Diagnosis Date   Diabetes mellitus without complication (HCC)    Diverticulosis    Hypercholesteremia    Hypertension    Kidney stones     Past Surgical History:  Procedure Laterality Date   CESAREAN SECTION     COLON SURGERY     COLONOSCOPY WITH PROPOFOL N/A 10/12/2016   Procedure: COLONOSCOPY WITH PROPOFOL;  Surgeon: Charolett Bumpers, MD;  Location: [facility];  Service: Endoscopy;  Laterality: N/A;   COLONOSCOPY WITH PROPOFOL N/A 07/22/2021   Procedure: COLONOSCOPY WITH PROPOFOL;  Surgeon: Kathi Der, MD;  Location: [facility];  Service: Gastroenterology;  Laterality: N/A;   IR ANGIOGRAM SELECTIVE  EACH ADDITIONAL VESSEL  07/23/2021   IR ANGIOGRAM VISCERAL SELECTIVE  07/23/2021   IR ANGIOGRAM VISCERAL SELECTIVE  07/23/2021   IR EMBO ART  VEN HEMORR LYMPH EXTRAV  INC GUIDE ROADMAPPING   07/23/2021   IR US GUIDE VASC ACCESS RIGHT  07/23/2021     reports that she has quit smoking. She has a 1.00 pack-year smoking history. She has never used smokeless tobacco. She reports current alcohol use. She reports that she does not use drugs.  Allergies  Allergen Reactions   Ciprofloxacin Other (See Comments)    Tendonitis and joint pain   Amlodipine Besylate Other (See Comments)    Joint pain   Atorvastatin Other (See Comments)    "Joint pains / ?gout"   Metformin Hcl Er Diarrhea   Spironolactone Other (See Comments)    "Hair changes"    History reviewed. No pertinent family history.  Prior to Admission medications   Medication Sig Start Date End Date Taking? Authorizing Provider  acetaminophen (TYLENOL) 500 MG tablet Take 500-1,000 mg by mouth every 6 (six) hours as needed for mild pain or headache.    [provider]  Alpha-Lipoic Acid 600 MG CAPS Take 600 mg by mouth in the morning.    [provider]  B Complex Vitamins (VITAMIN B COMPLEX) TABS Take 1 tablet by mouth in the morning.    [provider]  Biotin 2500 MCG CAPS Take 2,500 mcg by mouth daily at 2 PM.    [provider]  Calcium Carbonate (CALCIUM 500 PO) Take 1,000 mg by mouth every evening.    [provider]  calcium carbonate (TUMS - DOSED IN MG ELEMENTAL CALCIUM) 500 MG chewable tablet Chew 1-2 tablets by mouth at bedtime as needed for indigestion or heartburn.    [provider]  Cholecalciferol (VITAMIN D3) 50 MCG (2000 UT) TABS Take 2,000 Units by mouth daily at 2 PM.    [provider]  Coenzyme Q10 (CO Q10) 100 MG CAPS Take 100 mg by mouth every evening.    [provider]  FIBER PO Take 1 capsule by mouth in the morning and at bedtime.    [provider]  Flaxseed, Linseed, (FLAX SEED OIL) 1000 MG CAPS Take 1,000 mg by mouth every evening.    [provider]  glimepiride (AMARYL) 2 MG tablet Take 2-3 mg by mouth  See admin instructions. Take 3 mg by mouth in the morning with breakfast and 2 mg with supper    [provider]  losartan (COZAAR) 100 MG tablet Take 1 tablet (100 mg total) by mouth every evening. 08/04/21   Rodolph Bong, MD  Multiple Vitamin (MULTIVITAMIN WITH MINERALS) TABS tablet Take 1 tablet by mouth daily with breakfast.    [provider]  Omega-3 Fatty Acids (FISH OIL PO) Take 1 capsule by mouth every evening.    [provider]  omeprazole (PRILOSEC OTC) 20 MG tablet Take 2 tablets (40 mg total) by mouth daily. Take 20 mg by mouth in the morning as needed for heartburn 07/28/21   Rodolph Bong, MD  polyethylene glycol powder (GLYCOLAX/MIRALAX) 17 GM/SCOOP powder Take 17 g by mouth See admin instructions. Mix 17 grams of powder into 4-8 ounces of water and drink in the morning as needed for constipation    [provider]  rosuvastatin (CRESTOR) 5 MG tablet Take 5 mg by mouth at bedtime.    [provider]  TURMERIC PO Take 1 capsule  by mouth in the morning.    [provider]  vitamin C (ASCORBIC ACID) 500 MG tablet Take 500 mg by mouth in the morning and at bedtime.    [provider]     Physical Exam: Vitals:   03/06/23 0000 03/06/23 0100 03/06/23 0156 03/06/23 0300  BP: (!) 158/71 (!) 140/63  127/67  Pulse: 74 76  77  Resp: (!) 21 19    Temp:   97.8 F (36.6 C)   TempSrc:   Oral   SpO2: 95% 91%  94%    Physical Exam Constitutional:      General: She is not in acute distress.    Appearance: She is not ill-appearing.  Eyes:     Pupils: Pupils are equal, round, and reactive to light.  Cardiovascular:     Rate and Rhythm: Normal rate and regular rhythm.     Pulses: Normal pulses.     Heart sounds: Normal heart sounds.  Pulmonary:     Effort: Pulmonary effort is normal.     Breath sounds: Normal breath sounds.  Abdominal:     General: Bowel sounds are normal. There is no distension.      Tenderness: There is no abdominal tenderness. There is no guarding or rebound.  Musculoskeletal:        General: Normal range of motion.     Cervical back: Neck supple.  Skin:    General: Skin is warm.     Capillary Refill: Capillary refill takes less than 2 seconds.  Neurological:     Mental Status: She is alert and oriented to person, place, and time.      Labs on Admission: I have personally reviewed following labs and imaging studies  CBC: Recent Labs  Lab 03/05/23 1755 03/05/23 2114 03/06/23 0313  WBC 6.9  --   --   HGB 12.0 11.3* 10.3*  HCT 37.4 35.4* 32.5*  MCV 98.7  --   --   PLT 258  --   --    Basic Metabolic Panel: Recent Labs  Lab 03/05/23 1755  NA 136  K 3.9  CL 104  CO2 22  GLUCOSE 120*  BUN 19  CREATININE 0.71  CALCIUM 9.5   GFR: CrCl cannot be calculated (Unknown ideal weight.). Liver Function Tests: Recent Labs  Lab 03/05/23 1755  AST 25  ALT 26  ALKPHOS 51  BILITOT 0.7  PROT 7.3  ALBUMIN 4.0   No results for input(s): "LIPASE", "AMYLASE" in the last 168 hours. No results for input(s): "AMMONIA" in the last 168 hours. Coagulation Profile: No results for input(s): "INR", "PROTIME" in the last 168 hours. Cardiac Enzymes: No results for input(s): "CKTOTAL", "CKMB", "CKMBINDEX", "TROPONINI", "TROPONINIHS" in the last 168 hours. BNP (last 3 results) No results for input(s): "BNP" in the last 8760 hours. HbA1C: No results for input(s): "HGBA1C" in the last 72 hours. CBG: No results for input(s): "GLUCAP" in the last 168 hours. Lipid Profile: No results for input(s): "CHOL", "HDL", "LDLCALC", "TRIG", "CHOLHDL", "LDLDIRECT" in the last 72 hours. Thyroid Function Tests: No results for input(s): "TSH", "T4TOTAL", "FREET4", "T3FREE", "THYROIDAB" in the last 72 hours. Anemia Panel: No results for input(s): "VITAMINB12", "FOLATE", "FERRITIN", "TIBC", "IRON", "RETICCTPCT" in the last 72 hours. Urine analysis:    Component Value Date/Time    COLORURINE YELLOW 07/25/2021 1702   APPEARANCEUR HAZY (A) 07/25/2021 1702   LABSPEC 1.014 07/25/2021 1702   PHURINE 5.0 07/25/2021 1702   GLUCOSEU NEGATIVE 07/25/2021 1702   GLUCOSEU  NEGATIVE 06/10/2008 0923   HGBUR NEGATIVE 07/25/2021 1702   BILIRUBINUR NEGATIVE 07/25/2021 1702   KETONESUR 20 (A) 07/25/2021 1702   PROTEINUR NEGATIVE 07/25/2021 1702   UROBILINOGEN 0.2 02/20/2009 1210   NITRITE POSITIVE (A) 07/25/2021 1702   LEUKOCYTESUR MODERATE (A) 07/25/2021 1702    Radiological Exams on Admission: I have personally reviewed images CT ANGIO GI BLEED  Result Date: 03/05/2023 CLINICAL DATA:  Lower GI bleed.  Rectal bleeding with clots. EXAM: CTA ABDOMEN AND PELVIS WITHOUT AND WITH CONTRAST TECHNIQUE: Multidetector CT imaging of the abdomen and pelvis was performed using the standard protocol during bolus administration of intravenous contrast. Multiplanar reconstructed images and MIPs were obtained and reviewed to evaluate the vascular anatomy. RADIATION DOSE REDUCTION: This exam was performed according to the departmental dose-optimization program which includes automated exposure control, adjustment of the mA and/or kV according to patient size and/or use of iterative reconstruction technique. CONTRAST:  OMNIPAQUE IOHEXOL 350 MG/ML SOLN COMPARISON:  None Available. FINDINGS: VASCULAR Aorta: Normal caliber aorta without aneurysm, dissection, vasculitis or significant stenosis. Aortic atherosclerosis. Celiac: Patent without evidence of aneurysm, dissection, vasculitis or significant stenosis. SMA: Patent without evidence of aneurysm, dissection, vasculitis or significant stenosis. Renals: Both renal arteries are patent without evidence of aneurysm, dissection, vasculitis, fibromuscular dysplasia or significant stenosis. Accessory renal arteries are noted bilaterally. IMA: Patent without evidence of aneurysm, dissection, vasculitis or significant stenosis. Inflow: Patent without evidence of  aneurysm, dissection, vasculitis or significant stenosis. Proximal Outflow: Bilateral common femoral and visualized portions of the superficial and profunda femoral arteries are patent without evidence of aneurysm, dissection, vasculitis or significant stenosis. Veins: No obvious venous abnormality within the limitations of this arterial phase study. Review of the MIP images confirms the above findings. NON-VASCULAR Lower chest: Mild atelectasis is present at the lung bases. Hepatobiliary: No focal liver abnormality is seen. Hepatic steatosis is noted. No gallstones, gallbladder wall thickening, or biliary dilatation. Pancreas: Unremarkable. No pancreatic ductal dilatation or surrounding inflammatory changes. Spleen: Normal in size without focal abnormality. Adrenals/Urinary Tract: No adrenal nodule or mass. The kidneys enhance symmetrically. No renal calculus or hydronephrosis. The visualized portion of the urinary bladder is within normal limits. Examination is limited due to hardware artifact. Stomach/Bowel: Stomach is within normal limits. Appendix is not definitely seen. No evidence of bowel wall thickening, distention, or inflammatory changes. No free air or pneumatosis. Scattered diverticula are present along the colon without evidence of diverticulitis. Anastomotic site is present at the rectosigmoid colon. There is a periumbilical hernia containing nonobstructed colon. No acute or active hemorrhage is seen. Lymphatic: No abdominopelvic lymphadenopathy. Reproductive: Uterus and bilateral adnexa are unremarkable. Other: No abdominopelvic ascites. Scattered fat containing ventral abdominal wall hernias are noted to the right of midline. 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 IMPRESSION: VASCULAR 1. No contrast extravasation to suggest active hemorrhage. 2. Aortic atherosclerosis without aneurysm or dissection. NON-VASCULAR 1. No acute or active hemorrhage is seen. 2. Colonic  diverticulosis without diverticulitis. 3. Ventral abdominal wall hernia containing nonobstructed colon. 4. Hepatic steatosis. Electronically Signed   By: Thornell Sartorius M.D.   On: 03/05/2023 22:04      Assessment/Plan: Principal Problem:   Active lower GI bleed Active Problems:   Hyperlipidemia   Essential hypertension   Acute blood loss anemia    Assessment and Plan: Active lower GI bleed Acute anemia secondary from GI blood loss -Patient With complaining of 7-8 episodes of passing a blood clot since this morning 7/sleek/24.  She  has not eaten anything yet.  Denies any abdominal pain, nausea vomiting and diarrhea. -Patient is hemodynamically stable and on presentation hemoglobin 12. 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 -Per chart review, per chart review patient was admitted in the hospital 07/18/2021 to 07/28/2021 with similar complaint.  During that time CT angiogram abdomen pelvis negative for acute bleed but follow-up nuclear medicine bleeding scan noted possible bleed at the splenic flexure.  Patient required total 7 unit of blood transfusion during that hospitalization.  Patient declined intervention with IR.  However she continues to have bleeding throughout few days.  Following tagged RBC scan done on 07/23/2021 which showed bleeding at the splenic flexure IR reconsulted and performed angiography with embolization to middle colonic artery on 07/23/2021.  After the procedure patient developed periumbilical pain and general surgery consulted and they did not take any significant ischemic change at that time.  GI also seen the patient and felt that patient is not having fulminant rebleeding and recommended medical management and supportive care and patient was discharged to home to follow-up with gastroenterologist Dr. Dulce Sellar. - Patient reported called GI clinic and spoke with Dr. Bosie Clos today  who recommended patient to come to the emergency department for evaluation. -In the ED on initial presentation patient's vital stable except elevated blood pressure 178/76.  Patient reported she did not take any blood pressure regiment earlier in the morning. Lab work, CBC stable hemoglobin 12, hematocrit 37.  Platelet 258 and WBC 6.9.  CMP sodium 136, potassium 3.9, chloride 103, bicarb 22, glucose 120, creatinine 0.71.  In the emergency department patient went to bathroom once and noticed blood clot in the toilet bowl. Emergency medicine physician reached out to on-call gastroenterologist Dr. Bosie Clos recommended obtaining CTA abdomen, monitor H&H and he will evaluate the patient. -Continue to monitor H&H every 6 hours. - Stat type and screen.  Preparing to unit her blood.  Hemoglobin dropped to 12>11.3 in 4 hours apart.  If hemoglobin drop less than 9 I will transfuse 1 unit of blood. -Pending CTA abdomen. -I have reached out to Dr. Lavonia Drafts who recommended- If CTA negative then need to do tagged RBC scan and call IR if either is positive. -Continue to monitor on telemetry overnight. - Admitting patient to progressive unit - Currently n.p.o. and continue gentle hydration -Appreciate GI recommendation Addendum: Patient has bowel movement at 2 AM no active bleeding per rectum anymore.  However hemoglobin gradually dropping 12 to 10.3 now.  I prepped to 2 unit of packed RBC.  Need to transfuse if develop active bleed or next immunoglobin check less than 9.   History of hypertension - Currently n.p.o. holding home blood pressure regiment. - Continue as needed hydralazine   Hyperlipidemia - At home patient is on Lipitor.  Currently n.p.o. and holding oral regimen   DVT prophylaxis: SCDs Code Status: Full Code Diet: Currently n.p.o. Family Communication: Discussed care plan with patient and her husband at the bedside Disposition Plan: Tentative discharge to home in 2 to 3 days. Consults:  Gastroenterology Admission status: Inpatient, Step Down Unit  Severity of Illness: The appropriate patient status for this patient is INPATIENT. Inpatient status is judged to be reasonable and necessary in order to provide the required intensity of service to ensure the patient's safety. The patient's presenting symptoms, physical exam findings, and initial radiographic and laboratory data in the context of their chronic comorbidities is felt to place them at  high risk for further clinical deterioration. Furthermore, it is not anticipated that the patient will be medically stable for discharge from the hospital within 2 midnights of admission.   * I certify that at the point of admission it is my clinical judgment that the patient will require inpatient hospital care spanning beyond 2 midnights from the point of admission due to high intensity of service, high risk for further deterioration and high frequency of surveillance required.Marland Kitchen    Tereasa Coop MD [facility]  How to contact the [facility] Attending or Consulting provider 7A - 7P or covering provider during after hours 7P -7A, for this patient?   Check the care team in [facility] and look for a) attending/consulting [facility] provider listed and b) the [facility] team listed Log into [email or web address] and use [facility] universal password to access. If you do not have the password, please contact the hospital operator. Locate the [facility] provider you are looking for under [facility] and page to a number that you can be directly reached. If you still have difficulty reaching the provider, please page the [facility] (Director on Call) for the Hospitalists listed on amion for assistance.  03/06/2023, 3:35 AM

## 2023-03-05 NOTE — ED Triage Notes (Signed)
Pt with rectal bleeding she reports has heavy with clots since this morning.  Hx of same. States she had some 'vessels tied off" once that stopped it but it has recurred.  No chest pain, dizziness, shob, n/v/d.

## 2023-03-06 DIAGNOSIS — K5791 Diverticulosis of intestine, part unspecified, without perforation or abscess with bleeding: Secondary | ICD-10-CM | POA: Diagnosis not present

## 2023-03-06 LAB — PROTIME-INR
INR: 1 (ref 0.8–1.2)
Prothrombin Time: 13.3 seconds (ref 11.4–15.2)

## 2023-03-06 LAB — HEMOGLOBIN AND HEMATOCRIT, BLOOD
HCT: 32.5 % — ABNORMAL LOW (ref 36.0–46.0)
HCT: 33 % — ABNORMAL LOW (ref 36.0–46.0)
HCT: 33.1 % — ABNORMAL LOW (ref 36.0–46.0)
HCT: 33 % — ABNORMAL LOW (ref 36.0–46.0)
Hemoglobin: 10.3 g/dL — ABNORMAL LOW (ref 12.0–15.0)
Hemoglobin: 10.6 g/dL — ABNORMAL LOW (ref 12.0–15.0)
Hemoglobin: 10.7 g/dL — ABNORMAL LOW (ref 12.0–15.0)
Hemoglobin: 10.6 g/dL — ABNORMAL LOW (ref 12.0–15.0)

## 2023-03-06 LAB — GLUCOSE, CAPILLARY: Glucose-Capillary: 147 mg/dL — ABNORMAL HIGH (ref 70–99)

## 2023-03-06 LAB — COMPREHENSIVE METABOLIC PANEL
ALT: 26 U/L (ref 0–44)
AST: 31 U/L (ref 15–41)
Albumin: 3.7 g/dL (ref 3.5–5.0)
Alkaline Phosphatase: 45 U/L (ref 38–126)
Anion gap: 12 (ref 5–15)
BUN: 15 mg/dL (ref 8–23)
CO2: 24 mmol/L (ref 22–32)
Calcium: 9.5 mg/dL (ref 8.9–10.3)
Chloride: 103 mmol/L (ref 98–111)
Creatinine, Ser: 0.74 mg/dL (ref 0.44–1.00)
GFR, Estimated: >60 mL/min (ref >60)
Glucose, Bld: 155 mg/dL — ABNORMAL HIGH (ref 70–99)
Potassium: 3.8 mmol/L (ref 3.5–5.1)
Sodium: 139 mmol/L (ref 135–145)
Total Bilirubin: 0.6 mg/dL (ref 0.3–1.2)
Total Protein: 6.6 g/dL (ref 6.5–8.1)

## 2023-03-06 LAB — APTT: aPTT: 25 seconds (ref 24–36)

## 2023-03-06 NOTE — ED Notes (Addendum)
ED TO INPATIENT HANDOFF REPORT  ED Nurse Name and Phone #: Al Corpus, RN [phone]  S Name/Age/Gender Melissa [name] 77 y.o. female Room/Bed: 004C/004C  Code Status   Code Status: Full Code  Home/SNF/Other Home Patient oriented to: self, place, time, and situation Is this baseline? Yes   Triage Complete: Triage complete  Chief Complaint GI bleed [K92.2]  Triage Note Pt with rectal bleeding she reports has heavy with clots since this morning.  Hx of same. States she had some 'vessels tied off" once that stopped it but it has recurred.  No chest pain, dizziness, shob, n/v/d.     Allergies Allergies  Allergen Reactions   Ciprofloxacin Other (See Comments)    Tendonitis and joint pain   Amlodipine Besylate Other (See Comments)    Joint pain   Atorvastatin Other (See Comments)    "Joint pains / ?gout"   Metformin Hcl Er Diarrhea   Spironolactone Other (See Comments)    "Hair changes"    Level of Care/Admitting Diagnosis ED Disposition     ED Disposition  Admit   Condition  --   Comment  Hospital Area: [facility] [[id number]]  Level of Care: Progressive [102]  Admit to Progressive based on following criteria: Other see comments  Comments: Active GI bleed  May admit patient to [facility] or [facility] if equivalent level of care is available:: No  Covid Evaluation: Asymptomatic - no recent exposure (last 10 days) testing not required  Diagnosis: GI bleed [454098]  Admitting Physician: Tereasa Coop [[id number]]  Attending Physician: Tereasa Coop [[id number]]  Certification:: I certify this patient will need inpatient services for at least 2 midnights          B Medical/Surgery History Past Medical History:  Diagnosis Date   Diabetes mellitus without complication (HCC)    Diverticulosis    Hypercholesteremia    Hypertension    Kidney stones    Past Surgical History:  Procedure Laterality Date   CESAREAN SECTION     COLON SURGERY      COLONOSCOPY WITH PROPOFOL N/A 10/12/2016   Procedure: COLONOSCOPY WITH PROPOFOL;  Surgeon: Charolett Bumpers, MD;  Location: [facility];  Service: Endoscopy;  Laterality: N/A;   COLONOSCOPY WITH PROPOFOL N/A 07/22/2021   Procedure: COLONOSCOPY WITH PROPOFOL;  Surgeon: Kathi Der, MD;  Location: [facility];  Service: Gastroenterology;  Laterality: N/A;   IR ANGIOGRAM SELECTIVE EACH ADDITIONAL VESSEL  07/23/2021   IR ANGIOGRAM VISCERAL SELECTIVE  07/23/2021   IR ANGIOGRAM VISCERAL SELECTIVE  07/23/2021   IR EMBO ART  VEN HEMORR LYMPH EXTRAV  INC GUIDE ROADMAPPING  07/23/2021   IR US GUIDE VASC ACCESS RIGHT  07/23/2021     A IV Location/Drains/Wounds Patient Lines/Drains/Airways Status     Active Line/Drains/Airways     Name Placement date Placement time Site Days   Peripheral IV 03/05/23 20 G Right Antecubital 03/05/23  1900  Antecubital  1   Wound / Incision (Open or Dehisced) 07/23/21 Puncture Groin Anterior;Proximal;Right arterial access puncture site 07/23/21  2324  Groin  591            Intake/Output Last 24 hours No intake or output data in the 24 hours ending 03/06/23 1015  Labs/Imaging Results for orders placed or performed during the hospital encounter of 03/05/23 (from the past 48 hour(s))  Comprehensive metabolic panel     Status: Abnormal   Collection Time: 03/05/23  5:55 PM  Result Value Ref Range   Sodium  136 135 - 145 mmol/L   Potassium 3.9 3.5 - 5.1 mmol/L   Chloride 104 98 - 111 mmol/L   CO2 22 22 - 32 mmol/L   Glucose, Bld 120 (H) 70 - 99 mg/dL    Comment: Glucose reference range applies only to samples taken after fasting for at least 8 hours.   BUN 19 8 - 23 mg/dL   Creatinine, Ser 8.11 0.44 - 1.00 mg/dL   Calcium 9.5 8.9 - 91.4 mg/dL   Total Protein 7.3 6.5 - 8.1 g/dL   Albumin 4.0 3.5 - 5.0 g/dL   AST 25 15 - 41 U/L   ALT 26 0 - 44 U/L   Alkaline Phosphatase 51 38 - 126 U/L   Total Bilirubin 0.7 0.3 - 1.2 mg/dL   GFR, Estimated >78 >29  mL/min    Comment: (NOTE) Calculated using the CKD-EPI Creatinine Equation (2021)    Anion gap 10 5 - 15    Comment: Performed at [facility], [address] [place], [place] [address]  CBC     Status: Abnormal   Collection Time: 03/05/23  5:55 PM  Result Value Ref Range   WBC 6.9 4.0 - 10.5 K/uL   RBC 3.79 (L) 3.87 - 5.11 MIL/uL   Hemoglobin 12.0 12.0 - 15.0 g/dL   HCT 08.6 57.8 - 46.9 %   MCV 98.7 80.0 - 100.0 fL   MCH 31.7 26.0 - 34.0 pg   MCHC 32.1 30.0 - 36.0 g/dL   RDW 62.9 52.8 - 41.3 %   Platelets 258 150 - 400 K/uL   nRBC 0.0 0.0 - 0.2 %    Comment: Performed at [facility], [address], [place], [place] [address]  Type and screen [facility]     Status: None (Preliminary result)   Collection Time: 03/05/23  5:55 PM  Result Value Ref Range   ABO/RH(D) O POS    Antibody Screen NEG    Sample Expiration      03/08/2023,2359 Performed at [facility], [address] [place], [place] [address]    Unit Number [id number]    Blood Component Type RED CELLS,LR    Unit division 00    Status of Unit ALLOCATED    Transfusion Status OK TO TRANSFUSE    Crossmatch Result Compatible    Unit Number [id number]    Blood Component Type RBC LR PHER1    Unit division 00    Status of Unit ALLOCATED    Transfusion Status OK TO TRANSFUSE    Crossmatch Result Compatible   Prepare RBC (crossmatch)     Status: None   Collection Time: 03/05/23  9:05 PM  Result Value Ref Range   Order Confirmation      ORDER PROCESSED BY BLOOD BANK Performed at [facility], [address] [place], [place] [address]   Hemoglobin and hematocrit, blood     Status: Abnormal   Collection Time: 03/05/23  9:14 PM  Result Value Ref Range   Hemoglobin 11.3 (L) 12.0 - 15.0 g/dL   HCT 66.0 (L) 63.0 - 16.0 %    Comment: Performed at [facility], [address], [place], [place] [address]  Hemoglobin and hematocrit, blood     Status: Abnormal    Collection Time: 03/06/23  3:13 AM  Result Value Ref Range   Hemoglobin 10.3 (L) 12.0 - 15.0 g/dL   HCT 35.5 (L) 73.2 - 20.2 %  Comment: Performed at [facility], [address], [place], [place] [address]  Hemoglobin and hematocrit, blood     Status: Abnormal   Collection Time: 03/06/23  8:59 AM  Result Value Ref Range   Hemoglobin 10.6 (L) 12.0 - 15.0 g/dL   HCT 60.4 (L) 54.0 - 98.1 %    Comment: Performed at [facility], [address] [place], [place] [address]   CT ANGIO GI BLEED  Result Date: 03/05/2023 CLINICAL DATA:  Lower GI bleed.  Rectal bleeding with clots. EXAM: CTA ABDOMEN AND PELVIS WITHOUT AND WITH CONTRAST TECHNIQUE: Multidetector CT imaging of the abdomen and pelvis was performed using the standard protocol during bolus administration of intravenous contrast. Multiplanar reconstructed images and MIPs were obtained and reviewed to evaluate the vascular anatomy. RADIATION DOSE REDUCTION: This exam was performed according to the departmental dose-optimization program which includes automated exposure control, adjustment of the mA and/or kV according to patient size and/or use of iterative reconstruction technique. CONTRAST:  OMNIPAQUE IOHEXOL 350 MG/ML SOLN COMPARISON:  None Available. FINDINGS: VASCULAR Aorta: Normal caliber aorta without aneurysm, dissection, vasculitis or significant stenosis. Aortic atherosclerosis. Celiac: Patent without evidence of aneurysm, dissection, vasculitis or significant stenosis. SMA: Patent without evidence of aneurysm, dissection, vasculitis or significant stenosis. Renals: Both renal arteries are patent without evidence of aneurysm, dissection, vasculitis, fibromuscular dysplasia or significant stenosis. Accessory renal arteries are noted bilaterally. IMA: Patent without evidence of aneurysm, dissection, vasculitis or significant stenosis. Inflow: Patent without evidence of aneurysm, dissection, vasculitis or significant stenosis.  Proximal Outflow: Bilateral common femoral and visualized portions of the superficial and profunda femoral arteries are patent without evidence of aneurysm, dissection, vasculitis or significant stenosis. Veins: No obvious venous abnormality within the limitations of this arterial phase study. Review of the MIP images confirms the above findings. NON-VASCULAR Lower chest: Mild atelectasis is present at the lung bases. Hepatobiliary: No focal liver abnormality is seen. Hepatic steatosis is noted. No gallstones, gallbladder wall thickening, or biliary dilatation. Pancreas: Unremarkable. No pancreatic ductal dilatation or surrounding inflammatory changes. Spleen: Normal in size without focal abnormality. Adrenals/Urinary Tract: No adrenal nodule or mass. The kidneys enhance symmetrically. No renal calculus or hydronephrosis. The visualized portion of the urinary bladder is within normal limits. Examination is limited due to hardware artifact. Stomach/Bowel: Stomach is within normal limits. Appendix is not definitely seen. No evidence of bowel wall thickening, distention, or inflammatory changes. No free air or pneumatosis. Scattered diverticula are present along the colon without evidence of diverticulitis. Anastomotic site is present at the rectosigmoid colon. There is a periumbilical hernia containing nonobstructed colon. No acute or active hemorrhage is seen. Lymphatic: No abdominopelvic lymphadenopathy. Reproductive: Uterus and bilateral adnexa are unremarkable. Other: No abdominopelvic ascites. Scattered fat containing ventral abdominal wall hernias are noted to the right of midline. 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 IMPRESSION: VASCULAR 1. No contrast extravasation to suggest active hemorrhage. 2. Aortic atherosclerosis without aneurysm or dissection. NON-VASCULAR 1. No acute or active hemorrhage is seen. 2. Colonic diverticulosis without diverticulitis. 3. Ventral abdominal  wall hernia containing nonobstructed colon. 4. Hepatic steatosis. Electronically Signed   By: Thornell Sartorius M.D.   On: 03/05/2023 22:04    Pending Labs Unresulted Labs (From admission, onward)     Start     Ordered   03/06/23 0330  Comprehensive metabolic panel  Once,   R        03/06/23 0330   03/05/23 2145  Protime-INR  Once,   AD        03/05/23 2145   03/05/23 2145  APTT  Once,   AD        03/05/23 2145   03/05/23 2107  Hemoglobin and hematocrit, blood  Every 6 hours,   R (with TIMED occurrences)      03/05/23 2107            Vitals/Pain Today's Vitals   03/06/23 0845 03/06/23 0915 03/06/23 0930 03/06/23 1009  BP: 123/62 136/62 (!) 136/59   Pulse: 70 73 79   Resp: 20 20 (!) 21   Temp:    98.1 F (36.7 C)  TempSrc:    Oral  SpO2: 95% 95% 94%   PainSc:        Isolation Precautions No active isolations  Medications Medications  0.9 %  sodium chloride infusion (Manually program via Guardrails IV Fluids) (has no administration in time range)  pantoprazole (PROTONIX) injection 40 mg (40 mg Intravenous Given 03/05/23 2146)  sodium chloride flush (NS) 0.9 % injection 3 mL (3 mLs Intravenous Given 03/06/23 0938)  sodium chloride flush (NS) 0.9 % injection 3 mL (has no administration in time range)  0.9 %  sodium chloride infusion (has no administration in time range)  dextrose 5 % in lactated ringers infusion ( Intravenous Rate/Dose Verify 03/06/23 1610)  acetaminophen (TYLENOL) tablet 650 mg (has no administration in time range)    Or  acetaminophen (TYLENOL) suppository 650 mg (has no administration in time range)  ondansetron (ZOFRAN) tablet 4 mg (has no administration in time range)    Or  ondansetron (ZOFRAN) injection 4 mg (has no administration in time range)  hydrALAZINE (APRESOLINE) injection 10 mg (has no administration in time range)  iohexol (OMNIPAQUE) 350 MG/ML injection 100 mL (100 mLs Intravenous Contrast Given 03/05/23 2141)    Mobility walks     Focused  Assessments Cardiac Assessment Handoff:  Cardiac Rhythm: Normal sinus rhythm No results found for: "CKTOTAL", "CKMB", "CKMBINDEX", "TROPONINI" No results found for: "DDIMER" Does the Patient currently have chest pain? No    R Recommendations: See Admitting Provider Note  Report given to:   Additional Notes: A&Ox4, ambulatory, hgb 10.6 up from 10.4, serial checks, type and screened complete, no transfusion orders, asymptomatic, recent drop in hgb from 12, family at [facility]

## 2023-03-06 NOTE — Progress Notes (Signed)
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  Patient has been admitted for further assessment and management.  GI team has been consulted.  GI team is directing care.  CT scan of the abdomen is negative for active bleeding.  No further bleeding reported.  As documented above, patient has history of diffuse diverticular disease and prior GI bleed in 2022 that required embolization of an artery.  Abdominal pain.  No other GI symptoms endorsed.  03/06/2023: Patient seen alongside patient's husband.  Above documentation noted.  No new complaints.  No further bleeding.  GI input is highly appreciated.   Assessment & Plan:   Principal Problem:   Active lower GI bleed Active Problems:   Hyperlipidemia   Essential hypertension   Acute blood loss anemia   Active lower GI bleed Acute anemia secondary from GI blood loss - History of prior GI bleed in 2022. -Bleeding artery was identified, s/p embolization. -Presents with another rectal bleed today. -Diffuse diverticulosis is noted. -Abdomen is painless. -No further bleeding reported. -Suspect diverticular bleed. -GI is directing care. -Continue to monitor H/H.   History of hypertension - Currently n.p.o. holding home blood pressure regiment. - Continue as needed hydralazine 03/06/2023: Blood pressure control is improved.     Hyperlipidemia - At home patient is on Lipitor.  Currently n.p.o. and holding oral regimen     DVT prophylaxis: SCD. Code Status: Full code. Family Communication: Husband. Disposition Plan: Will eventually   Consultants:  GI bleed  Procedures:   None  Antimicrobials:  None   Subjective: No further bleeding reported.  Objective: Vitals:   03/06/23 0800 03/06/23 0815 03/06/23 0830 03/06/23 0845  BP: (!) 148/64 (!) 145/70 (!) 138/58 123/62  Pulse: 73 74 66 70  Resp: 18 (!) 22 20 20   Temp:      TempSrc:      SpO2: 96% 96% 94% 95%   No intake or output data in the 24 hours ending 03/06/23 0917 There were no vitals filed for this visit.  Examination:  General exam: Appears calm and comfortable  Respiratory system: Clear to auscultation.  Cardiovascular system: S1 & S2 heard, RRR.Marland Kitchen Gastrointestinal system: Abdomen is obese, soft and nontender.   Central nervous system: Alert and oriented.  Patient moves all extremities. Extremities: No leg edema.  Data Reviewed: I have personally reviewed following labs and imaging studies  CBC: Recent Labs  Lab 03/05/23 1755 03/05/23 2114 03/06/23 0313  WBC 6.9  --   --   HGB 12.0 11.3* 10.3*  HCT 37.4 35.4* 32.5*  MCV 98.7  --   --   PLT 258  --   --    Basic Metabolic Panel: Recent Labs  Lab 03/05/23 1755  NA 136  K 3.9  CL 104  CO2 22  GLUCOSE 120*  BUN 19  CREATININE 0.71  CALCIUM 9.5   GFR: CrCl cannot be calculated (Unknown ideal weight.). Liver Function Tests: Recent Labs  Lab 03/05/23 1755  AST 25  ALT 26  ALKPHOS 51  BILITOT 0.7  PROT 7.3  ALBUMIN 4.0   No results for input(s): "LIPASE", "AMYLASE" in the last 168 hours. No results for input(s): "AMMONIA" in the last  168 hours. Coagulation Profile: No results for input(s): "INR", "PROTIME" in the last 168 hours. Cardiac Enzymes: No results for input(s): "CKTOTAL", "CKMB", "CKMBINDEX", "TROPONINI" in the last 168 hours. BNP (last 3 results) No results for input(s): "PROBNP" in the last 8760 hours. HbA1C: No results for input(s): "HGBA1C" in the last 72 hours. CBG: No results for input(s): "GLUCAP" in the last 168 hours. Lipid Profile: No results for input(s): "CHOL", "HDL", "LDLCALC",  "TRIG", "CHOLHDL", "LDLDIRECT" in the last 72 hours. Thyroid Function Tests: No results for input(s): "TSH", "T4TOTAL", "FREET4", "T3FREE", "THYROIDAB" in the last 72 hours. Anemia Panel: No results for input(s): "VITAMINB12", "FOLATE", "FERRITIN", "TIBC", "IRON", "RETICCTPCT" in the last 72 hours. Urine analysis:    Component Value Date/Time   COLORURINE YELLOW 07/25/2021 1702   APPEARANCEUR HAZY (A) 07/25/2021 1702   LABSPEC 1.014 07/25/2021 1702   PHURINE 5.0 07/25/2021 1702   GLUCOSEU NEGATIVE 07/25/2021 1702   GLUCOSEU NEGATIVE 06/10/2008 0923   HGBUR NEGATIVE 07/25/2021 1702   BILIRUBINUR NEGATIVE 07/25/2021 1702   KETONESUR 20 (A) 07/25/2021 1702   PROTEINUR NEGATIVE 07/25/2021 1702   UROBILINOGEN 0.2 02/20/2009 1210   NITRITE POSITIVE (A) 07/25/2021 1702   LEUKOCYTESUR MODERATE (A) 07/25/2021 1702   Sepsis Labs: @LABRCNTIP (procalcitonin:4,lacticidven:4)  )No results found for this or any previous visit (from the past 240 hour(s)).       Radiology Studies: CT ANGIO GI BLEED  Result Date: 03/05/2023 CLINICAL DATA:  Lower GI bleed.  Rectal bleeding with clots. EXAM: CTA ABDOMEN AND PELVIS WITHOUT AND WITH CONTRAST TECHNIQUE: Multidetector CT imaging of the abdomen and pelvis was performed using the standard protocol during bolus administration of intravenous contrast. Multiplanar reconstructed images and MIPs were obtained and reviewed to evaluate the vascular anatomy. RADIATION DOSE REDUCTION: This exam was performed according to the departmental dose-optimization program which includes automated exposure control, adjustment of the mA and/or kV according to patient size and/or use of iterative reconstruction technique. CONTRAST:  OMNIPAQUE IOHEXOL 350 MG/ML SOLN COMPARISON:  None Available. FINDINGS: VASCULAR Aorta: Normal caliber aorta without aneurysm, dissection, vasculitis or significant stenosis. Aortic atherosclerosis. Celiac: Patent without evidence of aneurysm,  dissection, vasculitis or significant stenosis. SMA: Patent without evidence of aneurysm, dissection, vasculitis or significant stenosis. Renals: Both renal arteries are patent without evidence of aneurysm, dissection, vasculitis, fibromuscular dysplasia or significant stenosis. Accessory renal arteries are noted bilaterally. IMA: Patent without evidence of aneurysm, dissection, vasculitis or significant stenosis. Inflow: Patent without evidence of aneurysm, dissection, vasculitis or significant stenosis. Proximal Outflow: Bilateral common femoral and visualized portions of the superficial and profunda femoral arteries are patent without evidence of aneurysm, dissection, vasculitis or significant stenosis. Veins: No obvious venous abnormality within the limitations of this arterial phase study. Review of the MIP images confirms the above findings. NON-VASCULAR Lower chest: Mild atelectasis is present at the lung bases. Hepatobiliary: No focal liver abnormality is seen. Hepatic steatosis is noted. No gallstones, gallbladder wall thickening, or biliary dilatation. Pancreas: Unremarkable. No pancreatic ductal dilatation or surrounding inflammatory changes. Spleen: Normal in size without focal abnormality. Adrenals/Urinary Tract: No adrenal nodule or mass. The kidneys enhance symmetrically. No renal calculus or hydronephrosis. The visualized portion of the urinary bladder is within normal limits. Examination is limited due to hardware artifact. Stomach/Bowel: Stomach is within normal limits. Appendix is not definitely seen. No evidence of bowel wall thickening, distention, or inflammatory changes. No free air or pneumatosis. Scattered diverticula are present along the colon without evidence of diverticulitis. Anastomotic site is present at the  rectosigmoid colon. There is a periumbilical hernia containing nonobstructed colon. No acute or active hemorrhage is seen. Lymphatic: No abdominopelvic lymphadenopathy.  Reproductive: Uterus and bilateral adnexa are unremarkable. Other: No abdominopelvic ascites. Scattered fat containing ventral abdominal wall hernias are noted to the right of midline. 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 IMPRESSION: VASCULAR 1. No contrast extravasation to suggest active hemorrhage. 2. Aortic atherosclerosis without aneurysm or dissection. NON-VASCULAR 1. No acute or active hemorrhage is seen. 2. Colonic diverticulosis without diverticulitis. 3. Ventral abdominal wall hernia containing nonobstructed colon. 4. Hepatic steatosis. Electronically Signed   By: Thornell Sartorius M.D.   On: 03/05/2023 22:04        Scheduled Meds:  sodium chloride   Intravenous Once   pantoprazole (PROTONIX) IV  40 mg Intravenous QHS   sodium chloride flush  3 mL Intravenous Q12H   Continuous Infusions:  sodium chloride     dextrose 5% lactated ringers 50 mL/hr at 03/06/23 0832     LOS: 1 day    Time spent: 35 minutes.    Berton Mount, MD  [facility] Pager #: [phone] 7PM-7AM contact night coverage as above

## 2023-03-06 NOTE — ED Notes (Addendum)
Patient ambulated to bathroom with no assistance. Denies any blood loss.

## 2023-03-06 NOTE — Consult Note (Signed)
Referring Provider: Dr. Janalyn Shy Primary Care Physician:  [name], MD Primary Gastroenterologist:  Dr. Dulce Sellar  Reason for Consultation:  Rectal bleeding  HPI: Melissa [name] is a 77 y.o. female with acute onset of red blood per rectum with clots that occurred 7 times yesterday and denies any rectal bleeding or BMs today. Denies abdominal pain, N/V/dizziness. History of GI bleeding in 2022 that required hospitalization and she feels the 2022 episode was more severe. Shows me a cell phone picture of cherry red blood filling the toilet. Last colonoscopy 06/2021 showed blood throughout the colon without a source pinpointed and diffuse diverticulosis noted. CT angiogram negative for active bleeding. Husband at bedside.  Past Medical History:  Diagnosis Date   Diabetes mellitus without complication (HCC)    Diverticulosis    Hypercholesteremia    Hypertension    Kidney stones     Past Surgical History:  Procedure Laterality Date   CESAREAN SECTION     COLON SURGERY     COLONOSCOPY WITH PROPOFOL N/A 10/12/2016   Procedure: COLONOSCOPY WITH PROPOFOL;  Surgeon: Charolett Bumpers, MD;  Location: [facility];  Service: Endoscopy;  Laterality: N/A;   COLONOSCOPY WITH PROPOFOL N/A 07/22/2021   Procedure: COLONOSCOPY WITH PROPOFOL;  Surgeon: Kathi Der, MD;  Location: [facility];  Service: Gastroenterology;  Laterality: N/A;   IR ANGIOGRAM SELECTIVE EACH ADDITIONAL VESSEL  07/23/2021   IR ANGIOGRAM VISCERAL SELECTIVE  07/23/2021   IR ANGIOGRAM VISCERAL SELECTIVE  07/23/2021   IR EMBO ART  VEN HEMORR LYMPH EXTRAV  INC GUIDE ROADMAPPING  07/23/2021   IR US GUIDE VASC ACCESS RIGHT  07/23/2021    Prior to Admission medications   Medication Sig Start Date End Date Taking? Authorizing Provider  acetaminophen (TYLENOL) 500 MG tablet Take 500-1,000 mg by mouth every 6 (six) hours as needed for mild pain or headache.   Yes [provider]  Alpha-Lipoic Acid 600 MG CAPS Take 600 mg  by mouth in the morning.   Yes [provider]  B Complex Vitamins (VITAMIN B COMPLEX) TABS Take 1 tablet by mouth in the morning.   Yes [provider]  Biotin 2500 MCG CAPS Take 2,500 mcg by mouth daily at 2 PM.   Yes [provider]  Calcium Carbonate (CALCIUM 500 PO) Take 500 mg by mouth every evening.   Yes [provider]  calcium carbonate (TUMS - DOSED IN MG ELEMENTAL CALCIUM) 500 MG chewable tablet Chew 1-2 tablets by mouth at bedtime as needed for indigestion or heartburn.   Yes [provider]  Cholecalciferol (VITAMIN D3) 50 MCG (2000 UT) TABS Take 2,000 Units by mouth daily at 2 PM.   Yes [provider]  Coenzyme Q10 (CO Q10) 100 MG CAPS Take 100 mg by mouth every evening.   Yes [provider]  FIBER PO Take 1 capsule by mouth in the morning and at bedtime.   Yes [provider]  glimepiride (AMARYL) 2 MG tablet Take 2-3 mg by mouth See admin instructions. Take 4 mg by mouth in the morning with breakfast and 3 mg with supper   Yes [provider]  Multiple Vitamin (MULTIVITAMIN WITH MINERALS) TABS tablet Take 1 tablet by mouth daily with breakfast.   Yes [provider]  omeprazole (PRILOSEC OTC) 20 MG tablet Take 2 tablets (40 mg total) by mouth daily. Take 20 mg by mouth in the morning as needed for heartburn Patient taking differently: Take 20 mg by mouth daily.  07/28/21  Yes Rodolph Bong, MD  polyethylene glycol powder (GLYCOLAX/MIRALAX) 17 GM/SCOOP powder Take 17 g by mouth See admin instructions. Mix 17 grams of powder into 4-8 ounces of water and drink in the morning as needed for constipation   Yes [provider]  rosuvastatin (CRESTOR) 5 MG tablet Take 5 mg by mouth at bedtime.   Yes [provider]  telmisartan (MICARDIS) 80 MG tablet Take 80 mg by mouth daily. 02/22/23  Yes [provider]  TURMERIC PO Take 1,000 mg by mouth every evening.   Yes [provider]  vitamin C (ASCORBIC ACID) 500 MG tablet Take 1,000 mg by mouth daily.   Yes [provider]  hydrochlorothiazide (HYDRODIURIL) 12.5 MG tablet Take 12.5 mg by mouth daily. Patient not taking: Reported on 03/05/2023 03/04/23   [provider]    Scheduled Meds:  sodium chloride   Intravenous Once   pantoprazole (PROTONIX) IV  40 mg Intravenous QHS   sodium chloride flush  3 mL Intravenous Q12H   Continuous Infusions:  sodium chloride     dextrose 5% lactated ringers 50 mL/hr at 03/06/23 0832   PRN Meds:.sodium chloride, acetaminophen **OR** acetaminophen, hydrALAZINE, ondansetron **OR** ondansetron (ZOFRAN) IV, sodium chloride flush  Allergies as of 03/05/2023 - Review Complete 03/05/2023  Allergen Reaction Noted   Ciprofloxacin Other (See Comments) 04/08/2020   Amlodipine besylate Other (See Comments) 06/06/2021   Atorvastatin Other (See Comments) 06/06/2021   Metformin hcl er Diarrhea 06/06/2021   Spironolactone Other (See Comments) 06/06/2021    History reviewed. No pertinent family history.  Social History   Socioeconomic History   Marital status: Married    Spouse name: Not on file   Number of children: Not on file   Years of education: Not on file   Highest education level: Not on file  Occupational History   Not on file  Tobacco Use   Smoking status: Former    Packs/day: 0.50    Years: 2.00    Additional pack years: 0.00    Total pack years: 1.00    Types: Cigarettes   Smokeless tobacco: Never  Substance and Sexual Activity   Alcohol use: Yes    Comment: occasional   Drug use: No   Sexual activity: Not on file  Other Topics Concern   Not on file  Social History Narrative   Not on file   Social Determinants of Health   Financial Resource Strain: Not on file  Food Insecurity: No Food Insecurity (03/06/2023)   Hunger Vital Sign    Worried About Running Out of Food in the Last Year: Never true    Ran Out of Food in the Last  Year: Never true  Transportation Needs: No Transportation Needs (03/06/2023)   PRAPARE - Administrator, Civil Service (Medical): No    Lack of Transportation (Non-Medical): No  Physical Activity: Not on file  Stress: Not on file  Social Connections: Not on file  Intimate Partner Violence: Not At Risk (03/06/2023)   Humiliation, Afraid, Rape, and Kick questionnaire    Fear of Current or Ex-Partner: No    Emotionally Abused: No    Physically Abused: No    Sexually Abused: No    Review of Systems: All negative except as stated above in HPI.  Physical Exam: Vital signs: Vitals:   03/06/23 1244 03/06/23 1412  BP: (!) 161/62 (!) 132/54  Pulse: 79 78  Resp: 16 20  Temp:  SpO2: 93%   T 98.1   Last BM Date : 03/05/23 (complains of bloody stools) General:  Lethargic, Well-developed, well-nourished, pleasant and cooperative in NAD Head: normocephalic, atraumatic Eyes: anicteric sclera ENT: oropharynx clear Neck: supple, nontender Lungs:  Clear throughout to auscultation.   No wheezes, crackles, or rhonchi. No acute distress. Heart:  Regular rate and rhythm; no murmurs, clicks, rubs,  or gallops. Abdomen: soft, nontender, nondistended, +BS  Rectal:  Deferred Ext: no edema  GI:  Lab Results: Recent Labs    03/05/23 1755 03/05/23 2114 03/06/23 0313 03/06/23 0859  WBC 6.9  --   --   --   HGB 12.0 11.3* 10.3* 10.6*  HCT 37.4 35.4* 32.5* 33.0*  PLT 258  --   --   --    BMET Recent Labs    03/05/23 1755  NA 136  K 3.9  CL 104  CO2 22  GLUCOSE 120*  BUN 19  CREATININE 0.71  CALCIUM 9.5   LFT Recent Labs    03/05/23 1755  PROT 7.3  ALBUMIN 4.0  AST 25  ALT 26  ALKPHOS 51  BILITOT 0.7   PT/INR No results for input(s): "LABPROT", "INR" in the last 72 hours.   Studies/Results: CT ANGIO GI BLEED  Result Date: 03/05/2023 CLINICAL DATA:  Lower GI bleed.  Rectal bleeding with clots. EXAM: CTA ABDOMEN AND PELVIS WITHOUT AND WITH CONTRAST TECHNIQUE:  Multidetector CT imaging of the abdomen and pelvis was performed using the standard protocol during bolus administration of intravenous contrast. Multiplanar reconstructed images and MIPs were obtained and reviewed to evaluate the vascular anatomy. RADIATION DOSE REDUCTION: This exam was performed according to the departmental dose-optimization program which includes automated exposure control, adjustment of the mA and/or kV according to patient size and/or use of iterative reconstruction technique. CONTRAST:  OMNIPAQUE IOHEXOL 350 MG/ML SOLN COMPARISON:  None Available. FINDINGS: VASCULAR Aorta: Normal caliber aorta without aneurysm, dissection, vasculitis or significant stenosis. Aortic atherosclerosis. Celiac: Patent without evidence of aneurysm, dissection, vasculitis or significant stenosis. SMA: Patent without evidence of aneurysm, dissection, vasculitis or significant stenosis. Renals: Both renal arteries are patent without evidence of aneurysm, dissection, vasculitis, fibromuscular dysplasia or significant stenosis. Accessory renal arteries are noted bilaterally. IMA: Patent without evidence of aneurysm, dissection, vasculitis or significant stenosis. Inflow: Patent without evidence of aneurysm, dissection, vasculitis or significant stenosis. Proximal Outflow: Bilateral common femoral and visualized portions of the superficial and profunda femoral arteries are patent without evidence of aneurysm, dissection, vasculitis or significant stenosis. Veins: No obvious venous abnormality within the limitations of this arterial phase study. Review of the MIP images confirms the above findings. NON-VASCULAR Lower chest: Mild atelectasis is present at the lung bases. Hepatobiliary: No focal liver abnormality is seen. Hepatic steatosis is noted. No gallstones, gallbladder wall thickening, or biliary dilatation. Pancreas: Unremarkable. No pancreatic ductal dilatation or surrounding inflammatory changes. Spleen:  Normal in size without focal abnormality. Adrenals/Urinary Tract: No adrenal nodule or mass. The kidneys enhance symmetrically. No renal calculus or hydronephrosis. The visualized portion of the urinary bladder is within normal limits. Examination is limited due to hardware artifact. Stomach/Bowel: Stomach is within normal limits. Appendix is not definitely seen. No evidence of bowel wall thickening, distention, or inflammatory changes. No free air or pneumatosis. Scattered diverticula are present along the colon without evidence of diverticulitis. Anastomotic site is present at the rectosigmoid colon. There is a periumbilical hernia containing nonobstructed colon. No acute or active hemorrhage is seen. Lymphatic: No abdominopelvic lymphadenopathy. Reproductive:  Uterus and bilateral adnexa are unremarkable. Other: No abdominopelvic ascites. Scattered fat containing ventral abdominal wall hernias are noted to the right of midline. 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 IMPRESSION: VASCULAR 1. No contrast extravasation to suggest active hemorrhage. 2. Aortic atherosclerosis without aneurysm or dissection. NON-VASCULAR 1. No acute or active hemorrhage is seen. 2. Colonic diverticulosis without diverticulitis. 3. Ventral abdominal wall hernia containing nonobstructed colon. 4. Hepatic steatosis. Electronically Signed   By: Thornell Sartorius M.D.   On: 03/05/2023 22:04    Impression/Plan: Painless hematochezia likely due to diverticular bleeding. CT angiogram negative for active bleeding. No signs of ongoing bleeding. Cancelled RBC scan. Clear liquid diet today. If stable without further bleeding, advance diet tomorrow. Hold off on repeat colonoscopy unless bleeding recurs and persists. Supportive care. Dr. Ewing Schlein will f/u tomorrow.    LOS: 1 day   Shirley Friar  03/06/2023, 2:52 PM  Questions please call [phone]

## 2023-03-07 DIAGNOSIS — K920 Hematemesis: Secondary | ICD-10-CM

## 2023-03-07 LAB — HEMOGLOBIN AND HEMATOCRIT, BLOOD
HCT: 32.3 % — ABNORMAL LOW (ref 36.0–46.0)
HCT: 34.2 % — ABNORMAL LOW (ref 36.0–46.0)
HCT: 33.3 % — ABNORMAL LOW (ref 36.0–46.0)
Hemoglobin: 10.7 g/dL — ABNORMAL LOW (ref 12.0–15.0)
Hemoglobin: 11 g/dL — ABNORMAL LOW (ref 12.0–15.0)
Hemoglobin: 10.7 g/dL — ABNORMAL LOW (ref 12.0–15.0)

## 2023-03-07 LAB — BASIC METABOLIC PANEL
Anion gap: 8 (ref 5–15)
BUN: 12 mg/dL (ref 8–23)
CO2: 26 mmol/L (ref 22–32)
Calcium: 9.5 mg/dL (ref 8.9–10.3)
Chloride: 104 mmol/L (ref 98–111)
Creatinine, Ser: 0.69 mg/dL (ref 0.44–1.00)
GFR, Estimated: >60 mL/min (ref >60)
Glucose, Bld: 166 mg/dL — ABNORMAL HIGH (ref 70–99)
Potassium: 3.9 mmol/L (ref 3.5–5.1)
Sodium: 138 mmol/L (ref 135–145)

## 2023-03-07 LAB — CBC
HCT: 34.1 % — ABNORMAL LOW (ref 36.0–46.0)
Hemoglobin: 10.9 g/dL — ABNORMAL LOW (ref 12.0–15.0)
MCH: 31.7 pg (ref 26.0–34.0)
MCHC: 32 g/dL (ref 30.0–36.0)
MCV: 99.1 fL (ref 80.0–100.0)
Platelets: 244 10*3/uL (ref 150–400)
RBC: 3.44 MIL/uL — ABNORMAL LOW (ref 3.87–5.11)
RDW: 12.8 % (ref 11.5–15.5)
WBC: 5.6 10*3/uL (ref 4.0–10.5)
nRBC: 0 % (ref 0.0–0.2)

## 2023-03-07 LAB — MAGNESIUM: Magnesium: 1.7 mg/dL (ref 1.7–2.4)

## 2023-03-07 MED ORDER — TRAZODONE HCL 50 MG PO TABS: 50.0000 mg | ORAL_TABLET | Freq: Every evening | ORAL | Status: DC | PRN

## 2023-03-07 MED ORDER — HYDRALAZINE HCL 20 MG/ML IJ SOLN: 10.0000 mg | INTRAMUSCULAR | Status: DC | PRN

## 2023-03-07 MED ORDER — METOPROLOL TARTRATE 5 MG/5ML IV SOLN: 5.0000 mg | INTRAVENOUS | Status: DC | PRN

## 2023-03-07 MED ORDER — SENNOSIDES-DOCUSATE SODIUM 8.6-50 MG PO TABS: 1.0000 | ORAL_TABLET | Freq: Every evening | ORAL | Status: DC | PRN

## 2023-03-07 MED ORDER — IPRATROPIUM-ALBUTEROL 0.5-2.5 (3) MG/3ML IN SOLN: 3.0000 mL | RESPIRATORY_TRACT | Status: DC | PRN

## 2023-03-07 NOTE — Progress Notes (Signed)
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  [facility] was consulted.  CTA of the abdomen pelvis did not show any active bleeding.   Assessment & Plan:  Principal Problem:   Active lower GI bleed Active Problems:   Hyperlipidemia   Essential hypertension   Acute blood loss anemia     Active lower GI bleed Acute anemia secondary from GI blood loss - History of prior GI bleed in 2022.  Status post embolization in the past. - CT angiogram is negative for any active bleeding.  Seen by [facility] GI team, holding off on any endoscopic evaluation unless if bleeding recurs. - Monitor H&H -Clear liquid diet; adv diet per GI   History of hypertension -P.o. regimen on hold.  IV as needed   Hyperlipidemia Home statin on hold   DVT prophylaxis: SCD. Code Status: Full code. Family Communication: Husband. Disposition Plan: Will eventually       DVT prophylaxis: SCDs Start: 03/05/23 2117 Code Status: Full  Family Communication:  Spouse at bedside Status is: Inpatient Pending GI clearance.        Diet Orders (From admission, onward)     Start     Ordered   03/06/23 1454  Diet clear liquid Room service appropriate? Yes; Fluid consistency: Thin  Diet effective now       Question Answer Comment  Room service appropriate? Yes   Fluid consistency: Thin      03/06/23 1453            Subjective: Doing ok, bleeding appears to have improved. Fearful it may return.    Examination:  General exam: Appears calm and comfortable  Respiratory system: Clear to auscultation. Respiratory effort normal. Cardiovascular system: S1 & S2 heard, RRR. No JVD, murmurs, rubs, gallops or clicks. No pedal  edema. Gastrointestinal system: Abdomen is nondistended, soft and nontender. No organomegaly or masses felt. Normal bowel sounds heard. Central nervous system: Alert and oriented. No focal neurological deficits. Extremities: Symmetric 5 x 5 power. Skin: No rashes, lesions or ulcers Psychiatry: Judgement and insight appear normal. Mood & affect appropriate.  Objective: Vitals:   03/06/23 1941 03/07/23 0000 03/07/23 0403 03/07/23 0443  BP: (!) 159/53 (!) 157/66 (!) 140/65   Pulse: 84 71 74   Resp: 19 17 12    Temp: 98.2 F (36.8 C) 98.3 F (36.8 C) 98.2 F (36.8 C)   TempSrc: Oral Oral Oral   SpO2: 93% 90% 100%   Weight:    88 kg    Intake/Output Summary (Last 24 hours) at 03/07/2023 0805 Last data filed at 03/06/2023 1700 Gross per 24 hour  Intake 0 ml  Output --  Net 0 ml   Filed Weights   03/07/23 0443  Weight: 88 kg    Scheduled Meds:  sodium chloride   Intravenous Once   pantoprazole (PROTONIX) IV  40 mg Intravenous QHS   sodium chloride flush  3 mL Intravenous Q12H   Continuous Infusions:  sodium chloride      Nutritional status     Body mass index is 34.37 kg/m.  Data Reviewed:   CBC: Recent Labs  Lab 03/05/23 1755 03/05/23 2114 03/06/23 0313 03/06/23 0859 03/06/23 1512 03/06/23 1701 03/07/23 0309  WBC 6.9  --   --   --   --   --   --  HGB 12.0   < > 10.3* 10.6* 10.7* 10.6* 10.7*  HCT 37.4   < > 32.5* 33.0* 33.1* 33.0* 32.3*  MCV 98.7  --   --   --   --   --   --   PLT 258  --   --   --   --   --   --    < > = values in this interval not displayed.   Basic Metabolic Panel: Recent Labs  Lab 03/05/23 1755 03/06/23 1512  NA 136 139  K 3.9 3.8  CL 104 103  CO2 22 24  GLUCOSE 120* 155*  BUN 19 15  CREATININE 0.71 0.74  CALCIUM 9.5 9.5   GFR: CrCl cannot be calculated (Unknown ideal weight.). Liver Function Tests: Recent Labs  Lab 03/05/23 1755 03/06/23 1512  AST 25 31  ALT 26 26  ALKPHOS 51 45  BILITOT 0.7 0.6  PROT 7.3 6.6   ALBUMIN 4.0 3.7   No results for input(s): "LIPASE", "AMYLASE" in the last 168 hours. No results for input(s): "AMMONIA" in the last 168 hours. Coagulation Profile: Recent Labs  Lab 03/06/23 1512  INR 1.0   Cardiac Enzymes: No results for input(s): "CKTOTAL", "CKMB", "CKMBINDEX", "TROPONINI" in the last 168 hours. BNP (last 3 results) No results for input(s): "PROBNP" in the last 8760 hours. HbA1C: No results for input(s): "HGBA1C" in the last 72 hours. CBG: Recent Labs  Lab 03/06/23 2132  GLUCAP 147*   Lipid Profile: No results for input(s): "CHOL", "HDL", "LDLCALC", "TRIG", "CHOLHDL", "LDLDIRECT" in the last 72 hours. Thyroid Function Tests: No results for input(s): "TSH", "T4TOTAL", "FREET4", "T3FREE", "THYROIDAB" in the last 72 hours. Anemia Panel: No results for input(s): "VITAMINB12", "FOLATE", "FERRITIN", "TIBC", "IRON", "RETICCTPCT" in the last 72 hours. Sepsis Labs: No results for input(s): "PROCALCITON", "LATICACIDVEN" in the last 168 hours.  No results found for this or any previous visit (from the past 240 hour(s)).       Radiology Studies: CT ANGIO GI BLEED  Result Date: 03/05/2023 CLINICAL DATA:  Lower GI bleed.  Rectal bleeding with clots. EXAM: CTA ABDOMEN AND PELVIS WITHOUT AND WITH CONTRAST TECHNIQUE: Multidetector CT imaging of the abdomen and pelvis was performed using the standard protocol during bolus administration of intravenous contrast. Multiplanar reconstructed images and MIPs were obtained and reviewed to evaluate the vascular anatomy. RADIATION DOSE REDUCTION: This exam was performed according to the departmental dose-optimization program which includes automated exposure control, adjustment of the mA and/or kV according to patient size and/or use of iterative reconstruction technique. CONTRAST:  OMNIPAQUE IOHEXOL 350 MG/ML SOLN COMPARISON:  None Available. FINDINGS: VASCULAR Aorta: Normal caliber aorta without aneurysm, dissection,  vasculitis or significant stenosis. Aortic atherosclerosis. Celiac: Patent without evidence of aneurysm, dissection, vasculitis or significant stenosis. SMA: Patent without evidence of aneurysm, dissection, vasculitis or significant stenosis. Renals: Both renal arteries are patent without evidence of aneurysm, dissection, vasculitis, fibromuscular dysplasia or significant stenosis. Accessory renal arteries are noted bilaterally. IMA: Patent without evidence of aneurysm, dissection, vasculitis or significant stenosis. Inflow: Patent without evidence of aneurysm, dissection, vasculitis or significant stenosis. Proximal Outflow: Bilateral common femoral and visualized portions of the superficial and profunda femoral arteries are patent without evidence of aneurysm, dissection, vasculitis or significant stenosis. Veins: No obvious venous abnormality within the limitations of this arterial phase study. Review of the MIP images confirms the above findings. NON-VASCULAR Lower chest: Mild atelectasis is present at the lung bases. Hepatobiliary: No focal liver abnormality is seen.  Hepatic steatosis is noted. No gallstones, gallbladder wall thickening, or biliary dilatation. Pancreas: Unremarkable. No pancreatic ductal dilatation or surrounding inflammatory changes. Spleen: Normal in size without focal abnormality. Adrenals/Urinary Tract: No adrenal nodule or mass. The kidneys enhance symmetrically. No renal calculus or hydronephrosis. The visualized portion of the urinary bladder is within normal limits. Examination is limited due to hardware artifact. Stomach/Bowel: Stomach is within normal limits. Appendix is not definitely seen. No evidence of bowel wall thickening, distention, or inflammatory changes. No free air or pneumatosis. Scattered diverticula are present along the colon without evidence of diverticulitis. Anastomotic site is present at the rectosigmoid colon. There is a periumbilical hernia containing  nonobstructed colon. No acute or active hemorrhage is seen. Lymphatic: No abdominopelvic lymphadenopathy. Reproductive: Uterus and bilateral adnexa are unremarkable. Other: No abdominopelvic ascites. Scattered fat containing ventral abdominal wall hernias are noted to the right of midline. 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 IMPRESSION: VASCULAR 1. No contrast extravasation to suggest active hemorrhage. 2. Aortic atherosclerosis without aneurysm or dissection. NON-VASCULAR 1. No acute or active hemorrhage is seen. 2. Colonic diverticulosis without diverticulitis. 3. Ventral abdominal wall hernia containing nonobstructed colon. 4. Hepatic steatosis. Electronically Signed   By: Thornell Sartorius M.D.   On: 03/05/2023 22:04           LOS: 2 days   Time spent= 35 mins    Kamrie Fanton Joline Maxcy, MD [facility]  If 7PM-7AM, please contact night-coverage  03/07/2023, 8:05 AM

## 2023-03-07 NOTE — Progress Notes (Signed)
Melissa [name] 4:29 PM  Subjective: Patient doing well without signs of bleeding and she showed me a picture of her last small bowel movement which was dark Case discussed with her husband as well as my partner Dr. Bosie Clos we discussed her previous history of diverticular issues answered all of their questions  Objective: Vital signs stable afebrile no acute distress abdomen is soft nontender BUN and creatinine normal hemoglobin stable  Assessment: Seemingly resolved GI blood loss probably of diverticular origin  Plan: We discussed full liquids versus soft solids and she would like a combination of the 2 which I think is fine fully if no further bleeding she can go home tomorrow follow-up with her primary gastroenterologist Dr. Dulce Sellar and please call us sooner if signs of GI bleeding or GI question or problem  [facility]  office [phone] After 5PM or if no answer call [phone]

## 2023-03-08 DIAGNOSIS — K921 Melena: Secondary | ICD-10-CM

## 2023-03-08 LAB — BASIC METABOLIC PANEL
Anion gap: 8 (ref 5–15)
BUN: 8 mg/dL (ref 8–23)
CO2: 24 mmol/L (ref 22–32)
Calcium: 9.1 mg/dL (ref 8.9–10.3)
Chloride: 106 mmol/L (ref 98–111)
Creatinine, Ser: 0.73 mg/dL (ref 0.44–1.00)
GFR, Estimated: >60 mL/min (ref >60)
Glucose, Bld: 138 mg/dL — ABNORMAL HIGH (ref 70–99)
Potassium: 3.7 mmol/L (ref 3.5–5.1)
Sodium: 138 mmol/L (ref 135–145)

## 2023-03-08 LAB — HEMOGLOBIN AND HEMATOCRIT, BLOOD
HCT: 33.3 % — ABNORMAL LOW (ref 36.0–46.0)
Hemoglobin: 10.6 g/dL — ABNORMAL LOW (ref 12.0–15.0)

## 2023-03-08 LAB — CBC
HCT: 32.6 % — ABNORMAL LOW (ref 36.0–46.0)
Hemoglobin: 10.4 g/dL — ABNORMAL LOW (ref 12.0–15.0)
MCH: 31.7 pg (ref 26.0–34.0)
MCHC: 31.9 g/dL (ref 30.0–36.0)
MCV: 99.4 fL (ref 80.0–100.0)
Platelets: 226 10*3/uL (ref 150–400)
RBC: 3.28 MIL/uL — ABNORMAL LOW (ref 3.87–5.11)
RDW: 12.9 % (ref 11.5–15.5)
WBC: 5.4 10*3/uL (ref 4.0–10.5)
nRBC: 0 % (ref 0.0–0.2)

## 2023-03-08 LAB — MAGNESIUM: Magnesium: 1.7 mg/dL (ref 1.7–2.4)

## 2023-03-08 NOTE — Progress Notes (Signed)
Nena Jordan to be D/C'd home per MD order. Discussed with the patient and all questions fully answered.  Skin clean, dry and intact without evidence of skin break down, no evidence of skin tears noted.  IV catheter discontinued intact. Site without signs and symptoms of complications. Dressing and pressure applied.  An After Visit Summary was printed and given to the patient.  Patient escorted via WC, and D/C home via private auto.  Jon Gills  03/08/2023 11:23 AM

## 2023-03-08 NOTE — Discharge Summary (Signed)
Physician Discharge Summary  Melissa [name]:[id number] DOB: [birth year] DOA: 03/05/2023  PCP: [name], MD  Admit date: 03/05/2023 Discharge date: 03/08/2023  Admitted From: Home Disposition:  Home  Recommendations for Outpatient Follow-up:  Follow up with PCP in 1-2 weeks Please obtain BMP/CBC in one week your next doctors visit.  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  [facility] was consulted.  CTA of the abdomen pelvis did not show any active bleeding. Eventually patient was tolerating oral, cleared by GI to follow-up outpatient.     Assessment & Plan:  Principal Problem:   Active lower GI bleed Active Problems:   Hyperlipidemia   Essential hypertension   Acute blood loss anemia      Active lower GI bleed Acute anemia secondary from GI blood loss - History of prior GI bleed in 2022.  Status post embolization in the past. - CT angiogram is negative for any active bleeding.  Seen by [facility] team, hemoglobin remained stable.  Recommend outpatient follow-up.   History of hypertension - Resume home regimen   Hyperlipidemia Home statin     Discharge Diagnoses:  Principal Problem:   Active lower GI bleed Active Problems:   Hyperlipidemia   Essential hypertension   Acute blood loss anemia      Consultations: [facility]  Subjective: Feels well no complaints.  No further signs of bleeding.  Discharge Exam: Vitals:   03/08/23 0458 03/08/23 0757  BP: 129/63 (!) 151/55  Pulse: 73 76  Resp: 16 (!) 23  Temp: 98.1 F (36.7 C) 98 F (36.7 C)  SpO2: 94% 91%   Vitals:   03/07/23 2024 03/08/23 0055 03/08/23 0458 03/08/23 0757  BP:  (!) 145/70 129/63 (!) 151/55  Pulse:  79 81 73 76  Resp: 18 18 16  (!) 23  Temp:  98 F (36.7 C) 98.1 F (36.7 C) 98 F (36.7 C)  TempSrc:  Oral Oral Oral  SpO2: 93% 100% 94% 91%  Weight:        General: Pt is alert, awake, not in acute distress Cardiovascular: RRR, S1/S2 +, no rubs, no gallops Respiratory: CTA bilaterally, no wheezing, no rhonchi Abdominal: Soft, NT, ND, bowel sounds + Extremities: no edema, no cyanosis  Discharge Instructions   Allergies as of 03/08/2023       Reactions   Ciprofloxacin Other (See Comments)   Tendonitis and joint pain   Amlodipine Besylate Other (See Comments)   Joint pain   Atorvastatin Other (See Comments)   "Joint pains / ?gout"   Metformin Hcl Er Diarrhea   Spironolactone Other (See Comments)   "Hair changes"        Medication List     TAKE these medications    acetaminophen 500 MG tablet Commonly known as: TYLENOL Take 500-1,000 mg by mouth every 6 (six) hours as needed for mild pain or headache.   Alpha-Lipoic Acid 600 MG Caps Take 600 mg by mouth in the morning.   ascorbic acid 500 MG tablet Commonly known as: VITAMIN C Take 1,000 mg by mouth daily.   Biotin 2500 MCG Caps Take 2,500 mcg by mouth daily at 2 PM.   CALCIUM 500 PO Take 500 mg by mouth every evening.   calcium carbonate 500 MG  chewable tablet Commonly known as: TUMS - dosed in mg elemental calcium Chew 1-2 tablets by mouth at bedtime as needed for indigestion or heartburn.   Co Q10 100 MG Caps Take 100 mg by mouth every evening.   FIBER PO Take 1 capsule by mouth in the morning and at bedtime.   glimepiride 2 MG tablet Commonly known as: AMARYL Take 2-3 mg by mouth See admin instructions. Take 4 mg by mouth in the morning with breakfast and 3 mg with supper   hydrochlorothiazide 12.5 MG tablet Commonly known as: HYDRODIURIL Take 12.5 mg by mouth daily.   multivitamin with minerals Tabs tablet Take 1 tablet by mouth daily with breakfast.   omeprazole 20 MG tablet Commonly  known as: PRILOSEC OTC Take 2 tablets (40 mg total) by mouth daily. Take 20 mg by mouth in the morning as needed for heartburn What changed:  how much to take additional instructions   polyethylene glycol powder 17 GM/SCOOP powder Commonly known as: GLYCOLAX/MIRALAX Take 17 g by mouth See admin instructions. Mix 17 grams of powder into 4-8 ounces of water and drink in the morning as needed for constipation   rosuvastatin 5 MG tablet Commonly known as: CRESTOR Take 5 mg by mouth at bedtime.   telmisartan 80 MG tablet Commonly known as: MICARDIS Take 80 mg by mouth daily.   TURMERIC PO Take 1,000 mg by mouth every evening.   Vitamin B Complex Tabs Take 1 tablet by mouth in the morning.   Vitamin D3 50 MCG (2000 UT) Tabs Take 2,000 Units by mouth daily at 2 PM.        Follow-up Information     [name], MD. Schedule an appointment as soon as possible for a visit in 1 week(s).   Specialty: Internal Medicine Contact information: [address] [place] [address] [phone]                Allergies  Allergen Reactions   Ciprofloxacin Other (See Comments)    Tendonitis and joint pain   Amlodipine Besylate Other (See Comments)    Joint pain   Atorvastatin Other (See Comments)    "Joint pains / ?gout"   Metformin Hcl Er Diarrhea   Spironolactone Other (See Comments)    "Hair changes"    You were cared for by a hospitalist during your hospital stay. If you have any questions about your discharge medications or the care you received while you were in the hospital after you are discharged, you can call the unit and asked to speak with the hospitalist on call if the hospitalist that took care of you is not available. Once you are discharged, your primary care physician will handle any further medical issues. Please note that no refills for any discharge medications will be authorized once you are discharged, as it is imperative that you return to  your primary care physician (or establish a relationship with a primary care physician if you do not have one) for your aftercare needs so that they can reassess your need for medications and monitor your lab values.  You were cared for by a hospitalist during your hospital stay. If you have any questions about your discharge medications or the care you received while you were in the hospital after you are discharged, you can call the unit and asked to speak with the hospitalist on call if the hospitalist that took care of you is not available. Once you are discharged,  your primary care physician will handle any further medical issues. Please note that NO REFILLS for any discharge medications will be authorized once you are discharged, as it is imperative that you return to your primary care physician (or establish a relationship with a primary care physician if you do not have one) for your aftercare needs so that they can reassess your need for medications and monitor your lab values.  Please request your Prim.MD to go over all Hospital Tests and Procedure/Radiological results at the follow up, please get all Hospital records sent to your Prim MD by signing hospital release before you go home.  Get CBC, CMP, 2 view Chest X ray checked  by Primary MD during your next visit or SNF MD in 5-7 days ( we routinely change or add medications that can affect your baseline labs and fluid status, therefore we recommend that you get the mentioned basic workup next visit with your PCP, your PCP may decide not to get them or add new tests based on their clinical decision)  On your next visit with your primary care physician please Get Medicines reviewed and adjusted.  If you experience worsening of your admission symptoms, develop shortness of breath, life threatening emergency, suicidal or homicidal thoughts you must seek medical attention immediately by calling 911 or calling your MD immediately  if symptoms less  severe.  You Must read complete instructions/literature along with all the possible adverse reactions/side effects for all the Medicines you take and that have been prescribed to you. Take any new Medicines after you have completely understood and accpet all the possible adverse reactions/side effects.   Do not drive, operate heavy machinery, perform activities at heights, swimming or participation in water activities or provide baby sitting services if your were admitted for syncope or siezures until you have seen by Primary MD or a Neurologist and advised to do so again.  Do not drive when taking Pain medications.   Procedures/Studies: CT ANGIO GI BLEED  Result Date: 03/05/2023 CLINICAL DATA:  Lower GI bleed.  Rectal bleeding with clots. EXAM: CTA ABDOMEN AND PELVIS WITHOUT AND WITH CONTRAST TECHNIQUE: Multidetector CT imaging of the abdomen and pelvis was performed using the standard protocol during bolus administration of intravenous contrast. Multiplanar reconstructed images and MIPs were obtained and reviewed to evaluate the vascular anatomy. RADIATION DOSE REDUCTION: This exam was performed according to the departmental dose-optimization program which includes automated exposure control, adjustment of the mA and/or kV according to patient size and/or use of iterative reconstruction technique. CONTRAST:  OMNIPAQUE IOHEXOL 350 MG/ML SOLN COMPARISON:  None Available. FINDINGS: VASCULAR Aorta: Normal caliber aorta without aneurysm, dissection, vasculitis or significant stenosis. Aortic atherosclerosis. Celiac: Patent without evidence of aneurysm, dissection, vasculitis or significant stenosis. SMA: Patent without evidence of aneurysm, dissection, vasculitis or significant stenosis. Renals: Both renal arteries are patent without evidence of aneurysm, dissection, vasculitis, fibromuscular dysplasia or significant stenosis. Accessory renal arteries are noted bilaterally. IMA: Patent without evidence  of aneurysm, dissection, vasculitis or significant stenosis. Inflow: Patent without evidence of aneurysm, dissection, vasculitis or significant stenosis. Proximal Outflow: Bilateral common femoral and visualized portions of the superficial and profunda femoral arteries are patent without evidence of aneurysm, dissection, vasculitis or significant stenosis. Veins: No obvious venous abnormality within the limitations of this arterial phase study. Review of the MIP images confirms the above findings. NON-VASCULAR Lower chest: Mild atelectasis is present at the lung bases. Hepatobiliary: No focal liver abnormality is seen. Hepatic steatosis is noted. No  gallstones, gallbladder wall thickening, or biliary dilatation. Pancreas: Unremarkable. No pancreatic ductal dilatation or surrounding inflammatory changes. Spleen: Normal in size without focal abnormality. Adrenals/Urinary Tract: No adrenal nodule or mass. The kidneys enhance symmetrically. No renal calculus or hydronephrosis. The visualized portion of the urinary bladder is within normal limits. Examination is limited due to hardware artifact. Stomach/Bowel: Stomach is within normal limits. Appendix is not definitely seen. No evidence of bowel wall thickening, distention, or inflammatory changes. No free air or pneumatosis. Scattered diverticula are present along the colon without evidence of diverticulitis. Anastomotic site is present at the rectosigmoid colon. There is a periumbilical hernia containing nonobstructed colon. No acute or active hemorrhage is seen. Lymphatic: No abdominopelvic lymphadenopathy. Reproductive: Uterus and bilateral adnexa are unremarkable. Other: No abdominopelvic ascites. Scattered fat containing ventral abdominal wall hernias are noted to the right of midline. 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 IMPRESSION: VASCULAR 1. No contrast extravasation to suggest active hemorrhage. 2. Aortic atherosclerosis  without aneurysm or dissection. NON-VASCULAR 1. No acute or active hemorrhage is seen. 2. Colonic diverticulosis without diverticulitis. 3. Ventral abdominal wall hernia containing nonobstructed colon. 4. Hepatic steatosis. Electronically Signed   By: Thornell Sartorius M.D.   On: 03/05/2023 22:04     The results of significant diagnostics from this hospitalization (including imaging, microbiology, ancillary and laboratory) are listed below for reference.     Microbiology: No results found for this or any previous visit (from the past 240 hour(s)).   Labs: BNP (last 3 results) No results for input(s): "BNP" in the last 8760 hours. Basic Metabolic Panel: Recent Labs  Lab 03/05/23 1755 03/06/23 1512 03/07/23 0815 03/08/23 0317  NA 136 139 138 138  K 3.9 3.8 3.9 3.7  CL 104 103 104 106  CO2 22 24 26 24   GLUCOSE 120* 155* 166* 138*  BUN 19 15 12 8   CREATININE 0.71 0.74 0.69 0.73  CALCIUM 9.5 9.5 9.5 9.1  MG  --   --  1.7 1.7   Liver Function Tests: Recent Labs  Lab 03/05/23 1755 03/06/23 1512  AST 25 31  ALT 26 26  ALKPHOS 51 45  BILITOT 0.7 0.6  PROT 7.3 6.6  ALBUMIN 4.0 3.7   No results for input(s): "LIPASE", "AMYLASE" in the last 168 hours. No results for input(s): "AMMONIA" in the last 168 hours. CBC: Recent Labs  Lab 03/05/23 1755 03/05/23 2114 03/07/23 0815 03/07/23 1444 03/07/23 2023 03/08/23 0317 03/08/23 0936  WBC 6.9  --  5.6  --   --  5.4  --   HGB 12.0   < > 10.9* 11.0* 10.7* 10.4* 10.6*  HCT 37.4   < > 34.1* 34.2* 33.3* 32.6* 33.3*  MCV 98.7  --  99.1  --   --  99.4  --   PLT 258  --  244  --   --  226  --    < > = values in this interval not displayed.   Cardiac Enzymes: No results for input(s): "CKTOTAL", "CKMB", "CKMBINDEX", "TROPONINI" in the last 168 hours. BNP: Invalid input(s): "POCBNP" CBG: Recent Labs  Lab 03/06/23 2132  GLUCAP 147*   D-Dimer No results for input(s): "DDIMER" in the last 72 hours. Hgb A1c No results for input(s):  "HGBA1C" in the last 72 hours. Lipid Profile No results for input(s): "CHOL", "HDL", "LDLCALC", "TRIG", "CHOLHDL", "LDLDIRECT" in the last 72 hours. Thyroid function studies No results for input(s): "TSH", "T4TOTAL", "T3FREE", "THYROIDAB" in the last 72  hours.  Invalid input(s): "FREET3" Anemia work up No results for input(s): "VITAMINB12", "FOLATE", "FERRITIN", "TIBC", "IRON", "RETICCTPCT" in the last 72 hours. Urinalysis    Component Value Date/Time   COLORURINE YELLOW 07/25/2021 1702   APPEARANCEUR HAZY (A) 07/25/2021 1702   LABSPEC 1.014 07/25/2021 1702   PHURINE 5.0 07/25/2021 1702   GLUCOSEU NEGATIVE 07/25/2021 1702   GLUCOSEU NEGATIVE 06/10/2008 0923   HGBUR NEGATIVE 07/25/2021 1702   BILIRUBINUR NEGATIVE 07/25/2021 1702   KETONESUR 20 (A) 07/25/2021 1702   PROTEINUR NEGATIVE 07/25/2021 1702   UROBILINOGEN 0.2 02/20/2009 1210   NITRITE POSITIVE (A) 07/25/2021 1702   LEUKOCYTESUR MODERATE (A) 07/25/2021 1702   Sepsis Labs Recent Labs  Lab 03/05/23 1755 03/07/23 0815 03/08/23 0317  WBC 6.9 5.6 5.4   Microbiology No results found for this or any previous visit (from the past 240 hour(s)).   Time coordinating discharge:  I have spent 35 minutes face to face with the patient and on the ward discussing the patients care, assessment, plan and disposition with other care givers. >50% of the time was devoted counseling the patient about the risks and benefits of treatment/Discharge disposition and coordinating care.   SIGNED:   Dimple Nanas, MD  [facility] 03/08/2023, 10:48 AM   If 7PM-7AM, please contact night-coverage

## 2023-03-08 NOTE — Progress Notes (Signed)
   03/08/23 0747  TOC Brief Assessment  Insurance and Status Reviewed  Patient has primary care physician Yes  Home environment has been reviewed no needs  Prior level of function: independnet  Prior/Current Home Services No current home services  Social Determinants of Health Reivew SDOH reviewed no interventions necessary  Readmission risk has been reviewed Yes  Transition of care needs no transition of care needs at this time

## 2023-03-08 NOTE — Care Management Important Message (Signed)
Important Message  Patient Details  Name: Melissa [name] MRN: [id number] Date of Birth: 1946-05-14   Medicare Important Message Given:  Yes  Patient left prior to IM delivery copy will by mailed to the patient home address.    Anothony Bursch 03/08/2023, 2:34 PM

## 2023-03-09 LAB — TYPE AND SCREEN
ABO/RH(D): O POS
Antibody Screen: NEGATIVE
Unit division: 0
Unit division: 0

## 2023-03-09 LAB — BPAM RBC
Blood Product Expiration Date: 202408072359
Blood Product Expiration Date: 202408072359
Unit Type and Rh: 5100
Unit Type and Rh: 5100

## 2023-10-01 IMAGING — CR DG CHEST 2V
3 series · 3 of 3 positions shown · non-contrast
Comparison: 07/25/2021

CLINICAL DATA: Wheezing

EXAM:
CHEST - 2 VIEW

[w chest pa (1 of 2)]
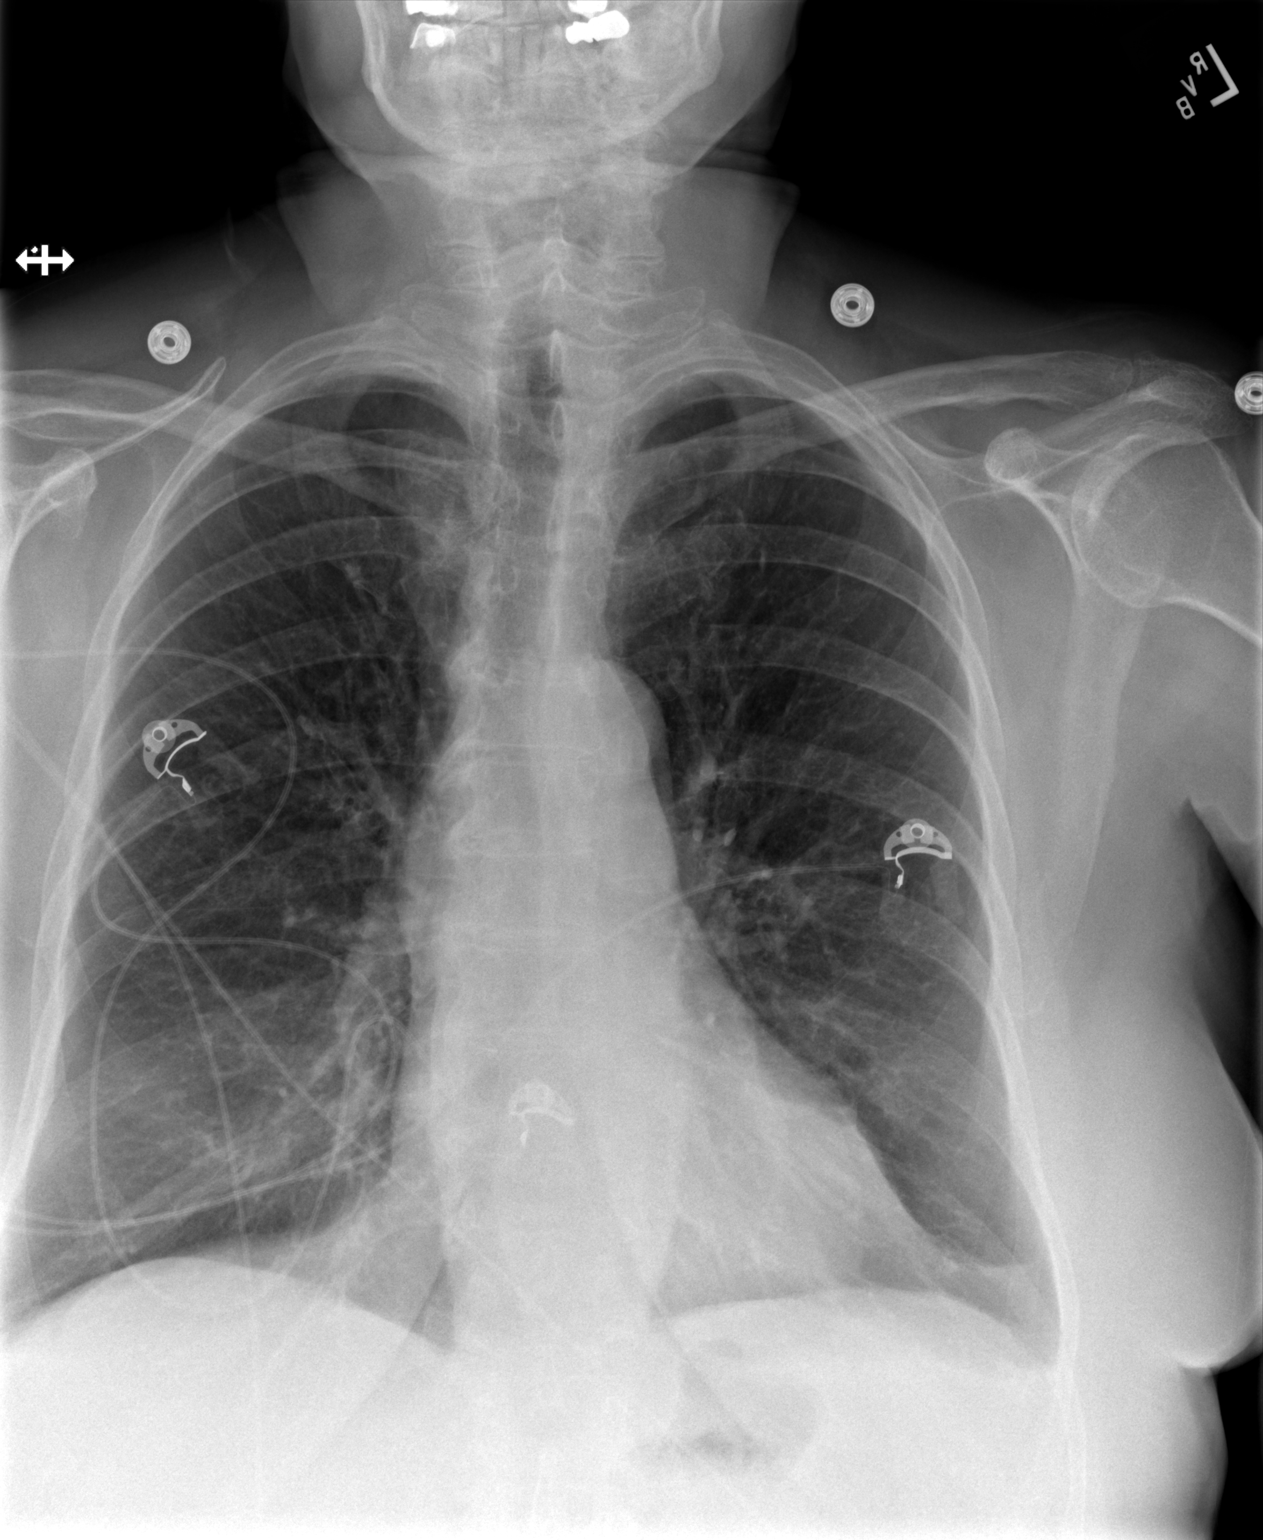

[w chest pa (2 of 2)]
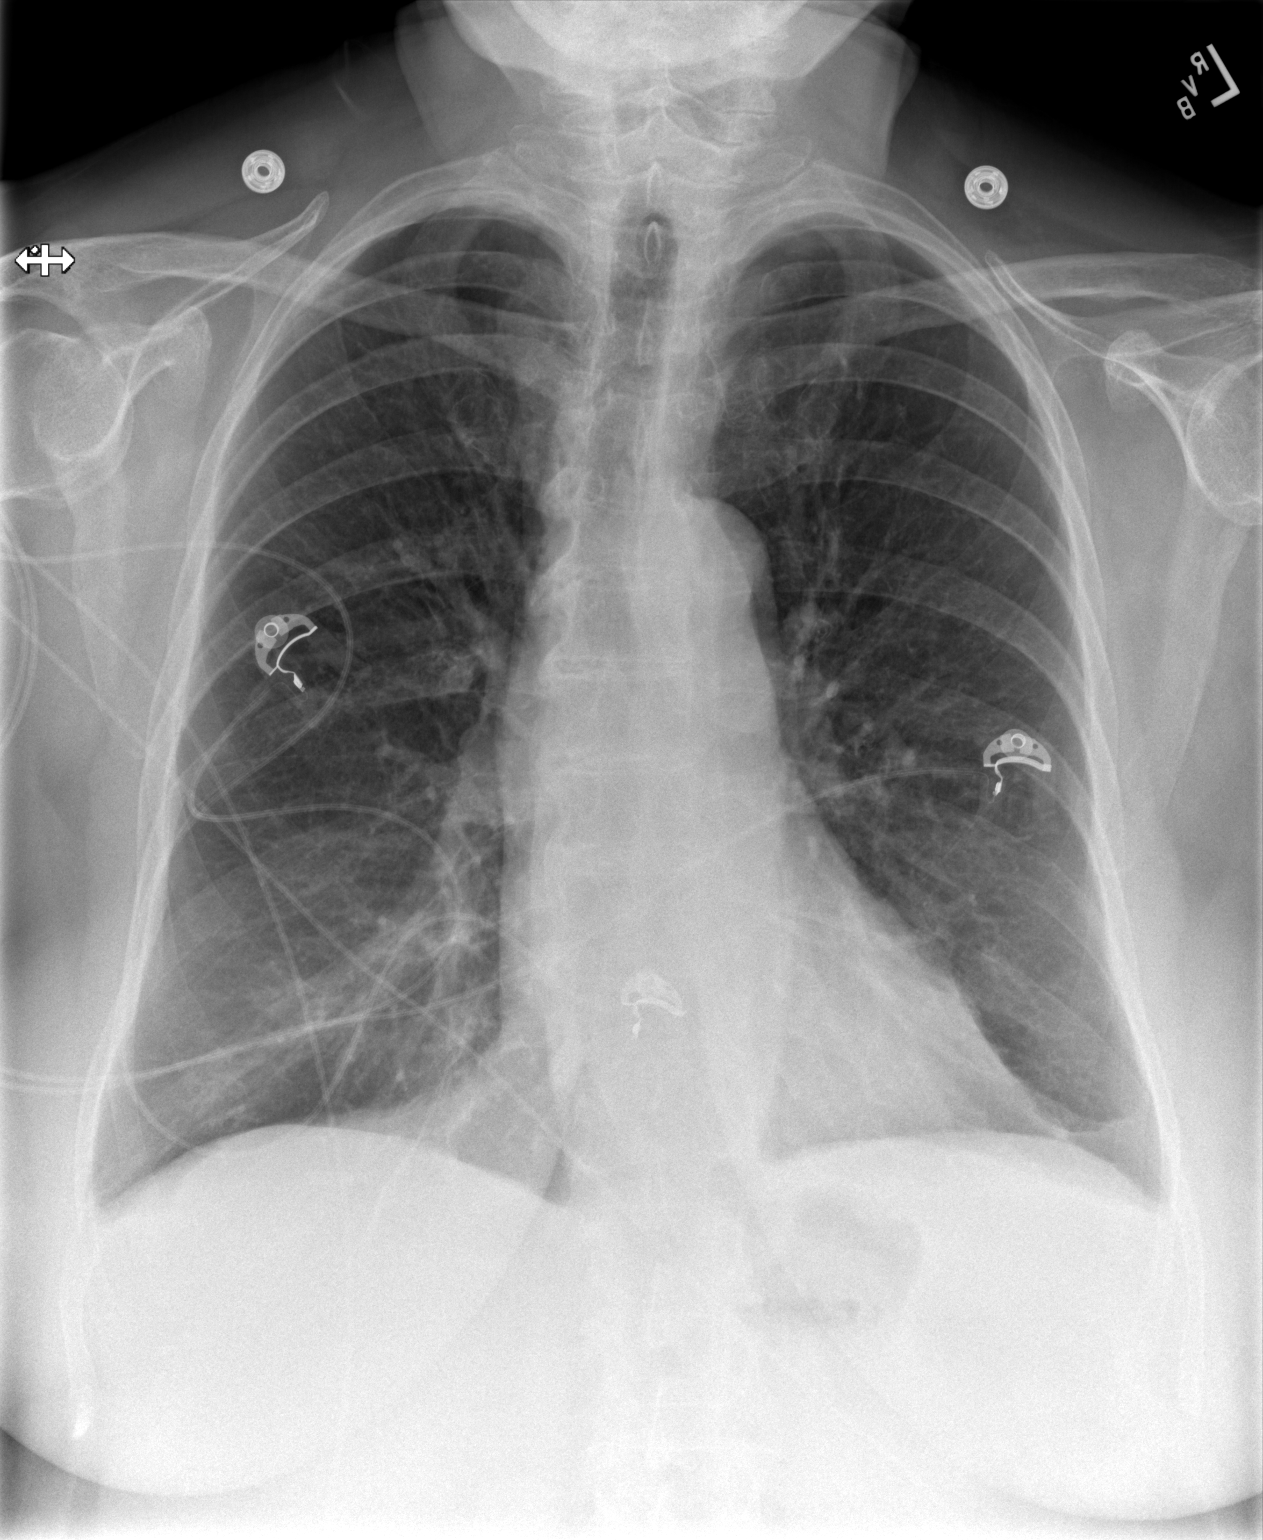

[w chest lat]
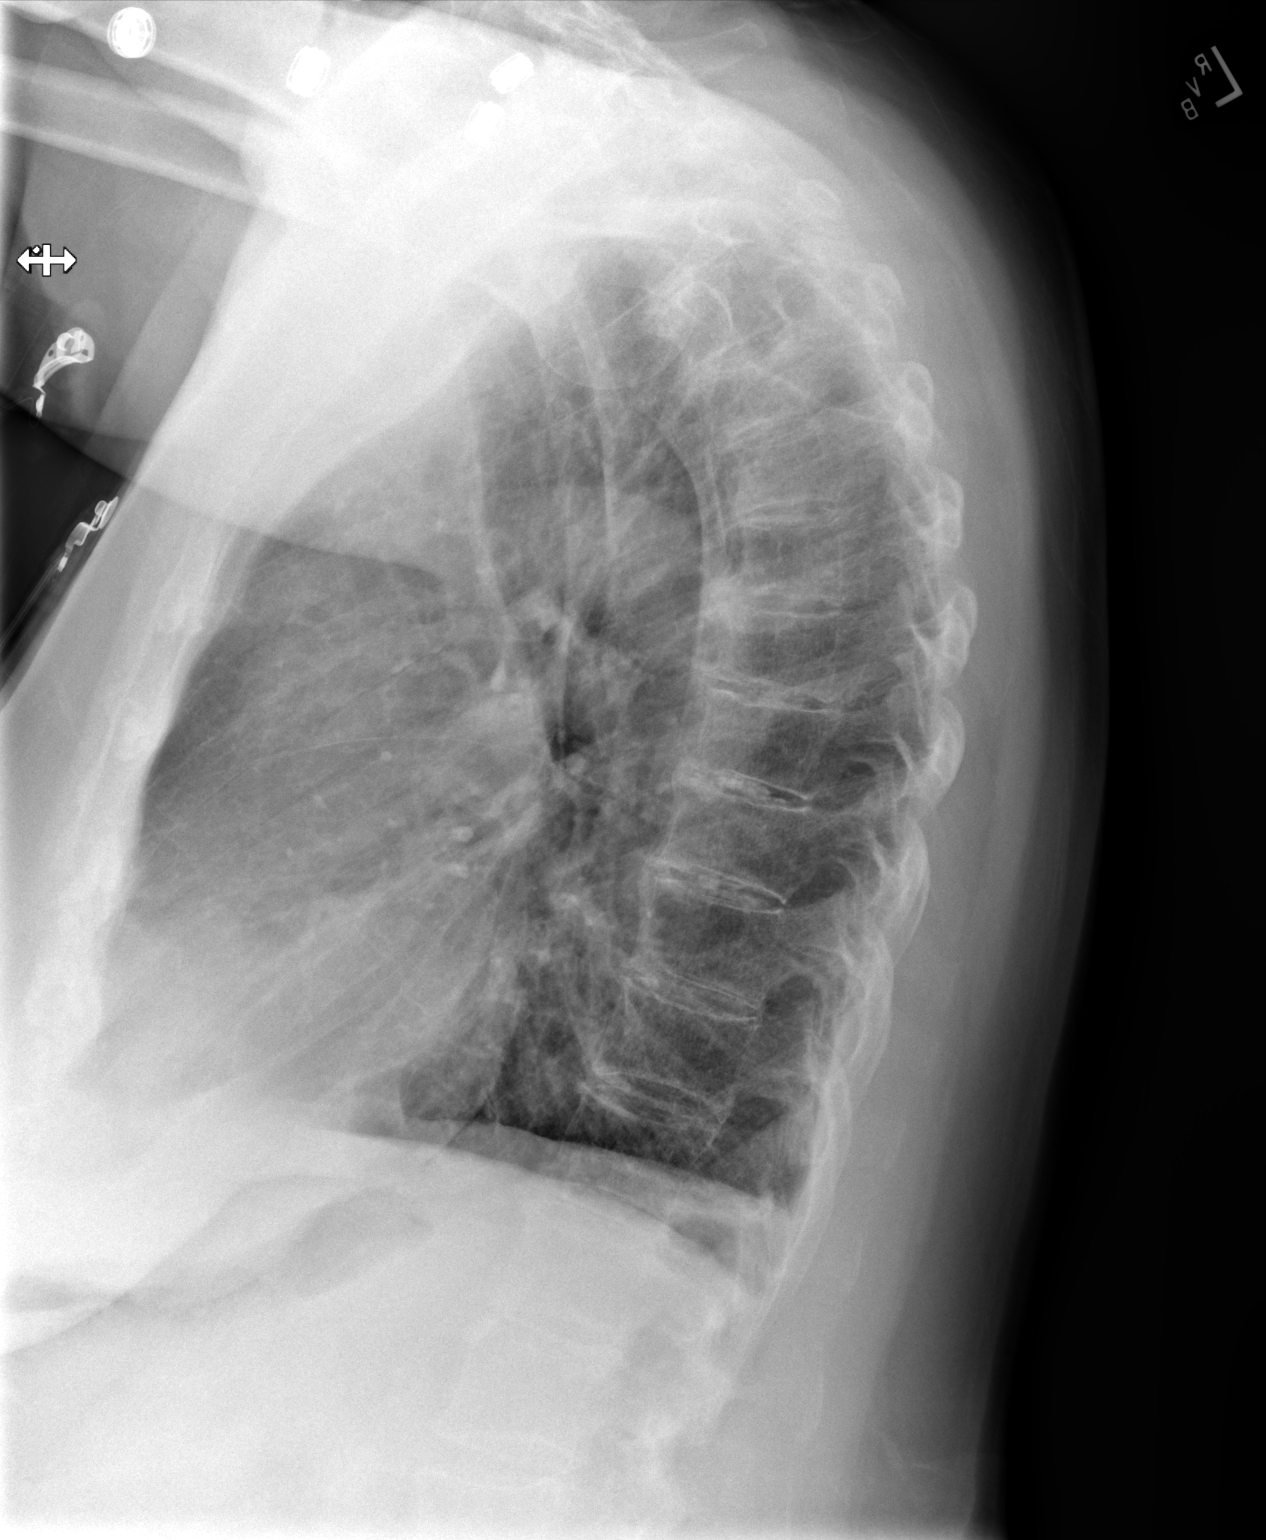

[3 of 3 positions shown; findings below may reference images not displayed]

FINDINGS: The heart size and mediastinal contours are within normal limits.
Both lungs are clear. No pleural effusion. The visualized skeletal
structures are unremarkable.
IMPRESSION: No active cardiopulmonary disease.
